# Patient Record
Sex: Female | Born: 1950 | Race: White | Hispanic: No | Marital: Married | Smoking: Never smoker
Health system: Southern US, Community
[De-identification: ages and names within clinical notes are randomized; demographics above are authoritative.]

## PROBLEM LIST (undated history)

## (undated) DIAGNOSIS — R51 Headache: Secondary | ICD-10-CM

## (undated) DIAGNOSIS — R519 Headache, unspecified: Secondary | ICD-10-CM

## (undated) DIAGNOSIS — IMO0002 Reserved for concepts with insufficient information to code with codable children: Secondary | ICD-10-CM

## (undated) DIAGNOSIS — T7840XA Allergy, unspecified, initial encounter: Secondary | ICD-10-CM

## (undated) DIAGNOSIS — C50919 Malignant neoplasm of unspecified site of unspecified female breast: Secondary | ICD-10-CM

## (undated) DIAGNOSIS — R011 Cardiac murmur, unspecified: Secondary | ICD-10-CM

## (undated) DIAGNOSIS — Z923 Personal history of irradiation: Secondary | ICD-10-CM

## (undated) DIAGNOSIS — C801 Malignant (primary) neoplasm, unspecified: Secondary | ICD-10-CM

## (undated) HISTORY — DX: Malignant (primary) neoplasm, unspecified: C80.1

## (undated) HISTORY — PX: BREAST CYST ASPIRATION: SHX578

## (undated) HISTORY — DX: Reserved for concepts with insufficient information to code with codable children: IMO0002

## (undated) HISTORY — DX: Cardiac murmur, unspecified: R01.1

## (undated) HISTORY — DX: Headache: R51

## (undated) HISTORY — DX: Headache, unspecified: R51.9

## (undated) HISTORY — DX: Allergy, unspecified, initial encounter: T78.40XA

## (undated) HISTORY — PX: OOPHORECTOMY: SHX86

---
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
## 1996-03-14 HISTORY — PX: ABDOMINAL HYSTERECTOMY: SHX81

## 1996-03-14 HISTORY — PX: BREAST EXCISIONAL BIOPSY: SUR124

## 2004-11-30 ENCOUNTER — Ambulatory Visit: Payer: Self-pay | Admitting: Endocrinology

## 2006-03-14 DIAGNOSIS — R011 Cardiac murmur, unspecified: Secondary | ICD-10-CM

## 2006-03-14 HISTORY — DX: Cardiac murmur, unspecified: R01.1

## 2006-03-14 HISTORY — PX: BREAST EXCISIONAL BIOPSY: SUR124

## 2006-05-23 ENCOUNTER — Ambulatory Visit: Payer: Self-pay | Admitting: Family Medicine

## 2006-05-26 ENCOUNTER — Ambulatory Visit: Payer: Self-pay | Admitting: Family Medicine

## 2006-06-12 ENCOUNTER — Ambulatory Visit: Payer: Self-pay | Admitting: General Surgery

## 2006-11-20 ENCOUNTER — Ambulatory Visit: Payer: Self-pay | Admitting: General Surgery

## 2007-03-15 DIAGNOSIS — C50919 Malignant neoplasm of unspecified site of unspecified female breast: Secondary | ICD-10-CM

## 2007-03-15 DIAGNOSIS — C801 Malignant (primary) neoplasm, unspecified: Secondary | ICD-10-CM

## 2007-03-15 HISTORY — PX: BREAST LUMPECTOMY: SHX2

## 2007-03-15 HISTORY — PX: BREAST BIOPSY: SHX20

## 2007-03-15 HISTORY — DX: Malignant (primary) neoplasm, unspecified: C80.1

## 2007-03-15 HISTORY — DX: Malignant neoplasm of unspecified site of unspecified female breast: C50.919

## 2007-05-29 ENCOUNTER — Ambulatory Visit: Payer: Self-pay | Admitting: General Surgery

## 2007-05-31 ENCOUNTER — Ambulatory Visit: Payer: Self-pay | Admitting: General Surgery

## 2007-07-02 ENCOUNTER — Ambulatory Visit: Payer: Self-pay | Admitting: General Surgery

## 2007-07-09 ENCOUNTER — Ambulatory Visit: Payer: Self-pay | Admitting: General Surgery

## 2007-07-23 ENCOUNTER — Ambulatory Visit: Payer: Self-pay | Admitting: Radiation Oncology

## 2007-08-13 ENCOUNTER — Ambulatory Visit: Payer: Self-pay | Admitting: Radiation Oncology

## 2007-08-23 ENCOUNTER — Emergency Department: Payer: Self-pay | Admitting: Emergency Medicine

## 2007-09-12 ENCOUNTER — Ambulatory Visit: Payer: Self-pay | Admitting: Radiation Oncology

## 2007-10-13 ENCOUNTER — Ambulatory Visit: Payer: Self-pay | Admitting: Radiation Oncology

## 2007-11-13 ENCOUNTER — Ambulatory Visit: Payer: Self-pay | Admitting: Radiation Oncology

## 2007-11-20 ENCOUNTER — Ambulatory Visit: Payer: Self-pay | Admitting: General Surgery

## 2008-01-13 ENCOUNTER — Ambulatory Visit: Payer: Self-pay | Admitting: Internal Medicine

## 2008-02-04 ENCOUNTER — Ambulatory Visit: Payer: Self-pay | Admitting: Internal Medicine

## 2008-02-12 ENCOUNTER — Ambulatory Visit: Payer: Self-pay | Admitting: Internal Medicine

## 2008-05-12 ENCOUNTER — Ambulatory Visit: Payer: Self-pay | Admitting: Internal Medicine

## 2008-05-19 ENCOUNTER — Ambulatory Visit: Payer: Self-pay | Admitting: General Surgery

## 2008-06-03 ENCOUNTER — Ambulatory Visit: Payer: Self-pay | Admitting: Internal Medicine

## 2008-06-12 ENCOUNTER — Ambulatory Visit: Payer: Self-pay | Admitting: Internal Medicine

## 2008-11-12 ENCOUNTER — Ambulatory Visit: Payer: Self-pay | Admitting: Internal Medicine

## 2008-12-01 ENCOUNTER — Ambulatory Visit: Payer: Self-pay | Admitting: Internal Medicine

## 2008-12-12 ENCOUNTER — Ambulatory Visit: Payer: Self-pay | Admitting: Internal Medicine

## 2009-08-19 ENCOUNTER — Ambulatory Visit: Payer: Self-pay | Admitting: General Surgery

## 2009-11-12 ENCOUNTER — Ambulatory Visit: Payer: Self-pay | Admitting: Internal Medicine

## 2009-12-08 ENCOUNTER — Ambulatory Visit: Payer: Self-pay | Admitting: Internal Medicine

## 2009-12-12 ENCOUNTER — Ambulatory Visit: Payer: Self-pay | Admitting: Internal Medicine

## 2010-08-25 ENCOUNTER — Ambulatory Visit: Payer: Self-pay | Admitting: General Surgery

## 2010-11-25 ENCOUNTER — Ambulatory Visit: Payer: Self-pay | Admitting: Family Medicine

## 2010-12-10 ENCOUNTER — Ambulatory Visit: Payer: Self-pay | Admitting: Internal Medicine

## 2010-12-13 ENCOUNTER — Ambulatory Visit: Payer: Self-pay | Admitting: Internal Medicine

## 2011-03-15 HISTORY — PX: COLONOSCOPY: SHX174

## 2011-09-21 ENCOUNTER — Ambulatory Visit: Payer: Self-pay | Admitting: General Surgery

## 2012-02-22 ENCOUNTER — Ambulatory Visit: Payer: Self-pay | Admitting: General Surgery

## 2012-07-24 ENCOUNTER — Encounter: Payer: Self-pay | Admitting: *Deleted

## 2012-07-24 DIAGNOSIS — C801 Malignant (primary) neoplasm, unspecified: Secondary | ICD-10-CM | POA: Insufficient documentation

## 2012-10-03 ENCOUNTER — Ambulatory Visit: Payer: Self-pay | Admitting: General Surgery

## 2012-11-08 ENCOUNTER — Encounter: Payer: Self-pay | Admitting: *Deleted

## 2012-12-04 ENCOUNTER — Encounter: Payer: Self-pay | Admitting: General Surgery

## 2012-12-04 ENCOUNTER — Ambulatory Visit: Payer: Self-pay | Admitting: General Surgery

## 2012-12-18 ENCOUNTER — Encounter: Payer: Self-pay | Admitting: General Surgery

## 2012-12-18 ENCOUNTER — Ambulatory Visit (INDEPENDENT_AMBULATORY_CARE_PROVIDER_SITE_OTHER): Payer: BC Managed Care – PPO | Admitting: General Surgery

## 2012-12-18 VITALS — BP 142/78 | HR 76 | Resp 12 | Ht 61.0 in | Wt 156.0 lb

## 2012-12-18 DIAGNOSIS — Z853 Personal history of malignant neoplasm of breast: Secondary | ICD-10-CM

## 2012-12-18 NOTE — Progress Notes (Signed)
Patient ID: Kelly Mata, female   DOB: 1950-06-27, 62 y.o.   MRN: [id number]  Chief Complaint  Patient presents with  . Follow-up    mammogram    HPI Kelly Mata is a 62 y.o. female.  who presents for her annual breast evaluation. The most recent mammogram was done on 12-04-12.  Patient does perform regular self breast checks and gets regular mammograms done.    HPI  Past Medical History  Diagnosis Date  . Allergy   . Heart murmur 2008  . 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 Abdominal hysterectomy  1998  . Tonsillectomy  1957  . Breast biopsy  2008  . Breast biopsy Left 2009    stereo breast biopsy  . Breast lumpectomy Left 2009  . Breast biopsy  1998  . Colonoscopy  2013    Family History  Problem Relation Age of Onset  . Cancer Other     cousin    Social History History  Substance Use Topics  . Smoking status: Never Smoker   . Smokeless tobacco: Never Used  . Alcohol Use: Yes     Comment: 1/day    Allergies  Allergen Reactions  . Ceclor [Cefaclor] Hives  . Ampicillin Rash    No current outpatient prescriptions on file.   No current facility-administered medications for this visit.    Review of Systems Review of Systems  Constitutional: Negative.   Respiratory: Negative.   Cardiovascular: Negative.     Blood pressure 142/78, pulse 76, resp. rate 12, height 5\' 1"  (1.549 m), weight 156 lb (70.761 kg).  Physical Exam Physical Exam  Constitutional: She is oriented to person, place, and time. She appears well-developed and well-nourished.  Eyes: Conjunctivae are normal. No scleral icterus.  Neck: Neck supple. No mass and no thyromegaly present.  Cardiovascular: Normal rate, regular rhythm, normal heart sounds, intact distal pulses and normal pulses.   Pulses:      Dorsalis pedis pulses are 2+ on the right side, and 2+ on the left side.       Posterior tibial pulses are 2+ on the right  side, and 2+ on the left side.  No edema and no VV  Pulmonary/Chest: Right breast exhibits no inverted nipple, no mass, no nipple discharge, no skin change and no tenderness. Left breast exhibits no inverted nipple, no mass, no nipple discharge, no skin change and no tenderness.  Left lumpectomy site well healed.  Abdominal: Soft. Normal appearance and bowel sounds are normal.  Lymphadenopathy:    She has no cervical adenopathy.    She has no axillary adenopathy.  Neurological: She is alert and oriented to person, place, and time.  Skin: Skin is warm and dry.    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     Plan The patient has been asked to return to the office in one year with a bilateral diagnostic mammogram.       Hill Mackie G 12/18/2012, 1:10 PM

## 2012-12-18 NOTE — Patient Instructions (Addendum)
Patient will be asked to return to the office in one year for a bilateral diagnotic mammogram. Advised on monthly self exam, call for any problems.

## 2012-12-18 NOTE — Addendum Note (Signed)
Addended by: Kieth Brightly on: 12/18/2012 01:12 PM   Modules accepted: Level of Service

## 2013-11-22 ENCOUNTER — Encounter: Payer: Self-pay | Admitting: General Surgery

## 2014-01-06 ENCOUNTER — Encounter: Payer: Self-pay | Admitting: General Surgery

## 2014-01-13 ENCOUNTER — Encounter: Payer: Self-pay | Admitting: General Surgery

## 2014-01-14 ENCOUNTER — Encounter: Payer: Self-pay | Admitting: General Surgery

## 2014-01-14 ENCOUNTER — Ambulatory Visit (INDEPENDENT_AMBULATORY_CARE_PROVIDER_SITE_OTHER): Payer: BC Managed Care – PPO | Admitting: General Surgery

## 2014-01-14 VITALS — BP 130/78 | HR 74 | Resp 12 | Ht 61.5 in | Wt 162.0 lb

## 2014-01-14 DIAGNOSIS — Z853 Personal history of malignant neoplasm of breast: Secondary | ICD-10-CM

## 2014-01-14 NOTE — Patient Instructions (Signed)
Patient to return in 1 year with a Bilateral diagnostic mammogram. Continue self breast exams. Call office for any new breast issues or concerns.

## 2014-01-14 NOTE — Progress Notes (Signed)
Patient ID: Kelly Mata, female   DOB: [birth year], 63 y.o.   MRN: [id number]  Chief Complaint  Patient presents with  . Follow-up    mammogram    HPI Kelly Mata is a 63 y.o. female who presents for a breast cancer follow up. The most recent mammogram was done on 01/03/14. Patient does perform regular self breast checks and gets regular mammograms done. The patient denies any new problems with the breasts at this time.     HPI  Past Medical History  Diagnosis Date  . Allergy   . Heart murmur 2008  . 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 Tonsillectomy  1957  . Breast biopsy  2008  . Breast biopsy Left 2009    stereo breast biopsy  . Breast lumpectomy Left 2009  . Breast biopsy  1998  . Colonoscopy  2013  . Abdominal hysterectomy  1998    complete     Family History  Problem Relation Age of Onset  . Cancer Other     cousin    Social History History  Substance Use Topics  . Smoking status: Never Smoker   . Smokeless tobacco: Never Used  . Alcohol Use: Yes     Comment: 1/day    Allergies  Allergen Reactions  . Ceclor [Cefaclor] Hives  . Ampicillin Rash    No current outpatient prescriptions on file.   No current facility-administered medications for this visit.    Review of Systems Review of Systems  Constitutional: Negative.   Respiratory: Negative.   Cardiovascular: Negative.     Blood pressure 130/78, pulse 74, resp. rate 12, height 5' 1.5" (1.562 m), weight 162 lb (73.483 kg).  Physical Exam Physical Exam  Constitutional: She is oriented to person, place, and time. She appears well-developed and well-nourished.  Eyes: Conjunctivae are normal. No scleral icterus.  Neck: Neck supple. No thyromegaly present.  Cardiovascular: Normal rate, regular rhythm and normal heart sounds.   No murmur heard. Pulmonary/Chest: Effort normal and breath sounds normal. Right breast exhibits no inverted  nipple, no mass, no nipple discharge, no skin change and no tenderness. Left breast exhibits no inverted nipple, no mass, no nipple discharge, no skin change and no tenderness.  Abdominal: Soft. Bowel sounds are normal. There is no tenderness.  Lymphadenopathy:    She has no cervical adenopathy.    She has no axillary adenopathy.  Neurological: She is alert and oriented to person, place, and time.  Skin: Skin is warm and dry.    Data Reviewed  Mammogram reviewed and stable.   Assessment    Stable exam. 1yrs post left breast lumpectomy, radiation, 5 yrs Tamoxifen    Plan    Bilateral diagnostic mammogram in 1 year.        SANKAR,SEEPLAPUTHUR G 01/14/2014, 9:54 AM

## 2015-01-09 ENCOUNTER — Encounter: Payer: Self-pay | Admitting: General Surgery

## 2015-01-15 ENCOUNTER — Ambulatory Visit (INDEPENDENT_AMBULATORY_CARE_PROVIDER_SITE_OTHER): Payer: BLUE CROSS/BLUE SHIELD | Admitting: General Surgery

## 2015-01-15 ENCOUNTER — Encounter: Payer: Self-pay | Admitting: General Surgery

## 2015-01-15 VITALS — BP 140/78 | HR 68 | Resp 12 | Ht 61.0 in | Wt 160.0 lb

## 2015-01-15 DIAGNOSIS — Z86000 Personal history of in-situ neoplasm of breast: Secondary | ICD-10-CM

## 2015-01-15 DIAGNOSIS — Z853 Personal history of malignant neoplasm of breast: Secondary | ICD-10-CM | POA: Diagnosis not present

## 2015-01-15 NOTE — Patient Instructions (Addendum)
The patient has been asked to return to the office in one year with a bilateral screening mammogram. Continue self breast exams. Call office for any new breast issues or concerns.  

## 2015-01-15 NOTE — Progress Notes (Signed)
Patient ID: Kelly Mata, female   DOB: May 20, 1950, 64 y.o.   MRN: [id number]  Chief Complaint  Patient presents with  . Follow-up    mammogram    HPI Kelly Mata is a 64 y.o. female.  who presents for her breast cancer follow up and breast evaluation. The most recent mammogram was done on 01-09-15.  Patient does perform regular self breast checks and gets regular mammograms done.  No new breast issues. I have reviewed the history of present illness with the patient. HPI  Past Medical History  Diagnosis Date  . Allergy   . Heart murmur 2008  . Cancer [facility]) 2009    left breast DCIS, 5 yrs tamoxifen    Past Surgical History  Procedure Laterality Date  . Tonsillectomy  1957  . Breast biopsy  2008  . Breast biopsy Left 2009    stereo breast biopsy  . Breast lumpectomy Left 2009  . Breast biopsy  1998  . Colonoscopy  2013  . Abdominal hysterectomy  1998    complete     Family History  Problem Relation Age of Onset  . Cancer Other     cousin, breast  . Cancer Father     pancreatic    Social History Social History  Substance Use Topics  . Smoking status: Never Smoker   . Smokeless tobacco: Never Used  . Alcohol Use: Yes     Comment: 1/day    Allergies  Allergen Reactions  . Ceclor [Cefaclor] Hives  . Ampicillin Rash    No current outpatient prescriptions on file.   No current facility-administered medications for this visit.    Review of Systems Review of Systems  Constitutional: Negative.   Respiratory: Negative.   Cardiovascular: Negative.     Blood pressure 140/78, pulse 68, resp. rate 12, height 5\' 1"  (1.549 m), weight 160 lb (72.576 kg).  Physical Exam Physical Exam  Constitutional: She is oriented to person, place, and time. She appears well-developed and well-nourished.  HENT:  Mouth/Throat: Oropharynx is clear and moist.  Eyes: Conjunctivae are normal. No scleral icterus.  Neck: Neck supple.  Cardiovascular: Normal rate, regular  rhythm and normal heart sounds.   Pulmonary/Chest: Effort normal and breath sounds normal. Right breast exhibits no inverted nipple, no mass, no nipple discharge, no skin change and no tenderness. Left breast exhibits no inverted nipple, no mass, no nipple discharge, no skin change and no tenderness.  Left lumpectomy site well healed.  Abdominal: Soft. Bowel sounds are normal. There is no tenderness.  Lymphadenopathy:    She has no cervical adenopathy.    She has no axillary adenopathy.  Neurological: She is alert and oriented to person, place, and time.  Skin: Skin is warm and dry.  Psychiatric: Her behavior is normal.    Data Reviewed Mammogram reviewed and stable.  Assessment    Stable physical exam. 7 yrs post left breast lumpectomy, radiation for DCIS, completed 5 yrs Tamoxifen.    Plan    The patient has been asked to return to the office in one year with a bilateral screening mammogram.      PCP:  Rozelle Logan 01/15/2015, 10:56 AM

## 2015-03-06 ENCOUNTER — Telehealth: Payer: Self-pay | Admitting: General Surgery

## 2015-03-06 NOTE — Telephone Encounter (Signed)
03-06-15 @ 9:36AM I L/M FOR PT TO CALL BACK. MAUREEN ASK ME TO CALL PT REGARDING HER BILL STM.SHE SENT THE STUB TO PAY WITH HER CARD INFO ON IT & SHE HAS TRIED ON SEVERAL TIMES TO RUN & CARD # WILL NOT GO THROUGH. @ 9:47AM PT CALLED WHILE I WAS TYPING NOTE & NEW INFO GIVEN & TRANSACTION WITH THROUGH/MTH

## 2015-08-18 ENCOUNTER — Encounter: Payer: Self-pay | Admitting: *Deleted

## 2015-08-20 ENCOUNTER — Ambulatory Visit: Payer: BLUE CROSS/BLUE SHIELD | Admitting: General Surgery

## 2015-08-25 ENCOUNTER — Ambulatory Visit: Payer: BLUE CROSS/BLUE SHIELD | Admitting: General Surgery

## 2015-12-22 ENCOUNTER — Other Ambulatory Visit: Payer: Self-pay | Admitting: General Surgery

## 2015-12-22 DIAGNOSIS — Z1239 Encounter for other screening for malignant neoplasm of breast: Secondary | ICD-10-CM

## 2015-12-30 ENCOUNTER — Ambulatory Visit
Admission: RE | Admit: 2015-12-30 | Discharge: 2015-12-30 | Disposition: A | Discharge status: Home / Self Care | Payer: Self-pay | Source: Ambulatory Visit | Attending: *Deleted | Admitting: *Deleted

## 2015-12-30 ENCOUNTER — Other Ambulatory Visit: Payer: Self-pay | Admitting: *Deleted

## 2015-12-30 DIAGNOSIS — Z9289 Personal history of other medical treatment: Secondary | ICD-10-CM

## 2016-01-01 ENCOUNTER — Other Ambulatory Visit: Payer: Self-pay | Admitting: *Deleted

## 2016-01-01 ENCOUNTER — Ambulatory Visit
Admission: RE | Admit: 2016-01-01 | Discharge: 2016-01-01 | Disposition: A | Discharge status: Home / Self Care | Payer: Self-pay | Source: Ambulatory Visit | Attending: *Deleted | Admitting: *Deleted

## 2016-01-01 DIAGNOSIS — Z9289 Personal history of other medical treatment: Secondary | ICD-10-CM

## 2016-01-04 ENCOUNTER — Encounter: Payer: Self-pay | Admitting: *Deleted

## 2016-02-03 ENCOUNTER — Ambulatory Visit
Admission: RE | Admit: 2016-02-03 | Discharge: 2016-02-03 | Disposition: A | Discharge status: Home / Self Care | Payer: BLUE CROSS/BLUE SHIELD | Source: Ambulatory Visit | Attending: General Surgery | Admitting: General Surgery

## 2016-02-03 DIAGNOSIS — Z1239 Encounter for other screening for malignant neoplasm of breast: Secondary | ICD-10-CM

## 2016-02-03 DIAGNOSIS — Z1231 Encounter for screening mammogram for malignant neoplasm of breast: Secondary | ICD-10-CM | POA: Diagnosis present

## 2016-02-09 ENCOUNTER — Encounter: Payer: Self-pay | Admitting: *Deleted

## 2016-02-11 ENCOUNTER — Encounter: Payer: Self-pay | Admitting: General Surgery

## 2016-02-11 ENCOUNTER — Ambulatory Visit (INDEPENDENT_AMBULATORY_CARE_PROVIDER_SITE_OTHER): Payer: BLUE CROSS/BLUE SHIELD | Admitting: General Surgery

## 2016-02-11 VITALS — BP 130/74 | HR 78 | Resp 12 | Ht 61.0 in | Wt 156.0 lb

## 2016-02-11 DIAGNOSIS — Z86000 Personal history of in-situ neoplasm of breast: Secondary | ICD-10-CM | POA: Diagnosis not present

## 2016-02-11 NOTE — Progress Notes (Signed)
Patient ID: Kelly Mata, female   DOB: Apr 06, 1950, 65 y.o.   MRN: [id number]  Chief Complaint  Patient presents with  . Follow-up    HPI Kelly Mata is a 65 y.o. female who presents for a breast evaluation, s/p left breast lumpectomy and radiation in 2009. The most recent mammogram was done on 02/03/2016.  Patient does perform regular self breast checks and gets regular mammograms done.   I have reviewed the history of present illness with the patient.   HPI  Past Medical History:  Diagnosis Date  . Allergy   . Cancer [facility]) 2009   left breast DCIS, 5 yrs tamoxifen  . Heart murmur 2008    Past Surgical History:  Procedure Laterality Date  . ABDOMINAL HYSTERECTOMY  1998   complete   . BREAST BIOPSY  2008  . BREAST BIOPSY Left 2009   stereo breast biopsy  . BREAST BIOPSY  1998  . BREAST LUMPECTOMY Left 2009   radiation  . COLONOSCOPY  2013  . TONSILLECTOMY  1957    Family History  Problem Relation Age of Onset  . Cancer Other     cousin, breast  . Cancer Father     pancreatic  . Breast cancer Cousin 67    Social History Social History  Substance Use Topics  . Smoking status: Never Smoker  . Smokeless tobacco: Never Used  . Alcohol use Yes     Comment: 1/day    Allergies  Allergen Reactions  . Ceclor [Cefaclor] Hives  . Ampicillin Rash    No current outpatient prescriptions on file.   No current facility-administered medications for this visit.     Review of Systems Review of Systems  Constitutional: Negative.   Respiratory: Negative.   Cardiovascular: Negative.     Blood pressure 130/74, pulse 78, resp. rate 12, height 5\' 1"  (1.549 m), weight 156 lb (70.8 kg).  Physical Exam Physical Exam  Constitutional: She is oriented to person, place, and time. She appears well-developed and well-nourished.  Eyes: Conjunctivae are normal. No scleral icterus.  Neck: Neck supple.  Cardiovascular: Normal rate, regular rhythm and normal heart sounds.    Pulmonary/Chest: Effort normal and breath sounds normal. Right breast exhibits no inverted nipple, no mass, no nipple discharge, no skin change and no tenderness. Left breast exhibits no inverted nipple, no mass, no nipple discharge, no skin change and no tenderness.  Left lumpectomy site well-healed.   Abdominal: Soft. Bowel sounds are normal. There is no tenderness.  Lymphadenopathy:    She has no cervical adenopathy.    She has no axillary adenopathy.  Neurological: She is alert and oriented to person, place, and time.  Skin: Skin is warm and dry.    Data Reviewed Mammogram reviewed and stable  Assessment    Stable exam. 8 years post left breast lumpectomy, completed radiation for DCIS and 5 years of Tamoxifen    Plan    Patient will be asked to return to the office in one year with a bilateral screening mammogram.    This information has been scribed by Gaspar Cola CMA.    SANKAR,SEEPLAPUTHUR G 02/11/2016, 10:24 AM

## 2016-02-11 NOTE — Patient Instructions (Signed)
Patient will be asked to return to the office in one year with a bilateral screening mammogram. 

## 2016-06-13 DIAGNOSIS — G8929 Other chronic pain: Secondary | ICD-10-CM | POA: Diagnosis not present

## 2016-06-13 DIAGNOSIS — M25562 Pain in left knee: Secondary | ICD-10-CM | POA: Diagnosis not present

## 2016-06-13 DIAGNOSIS — M25512 Pain in left shoulder: Secondary | ICD-10-CM | POA: Diagnosis not present

## 2016-06-23 ENCOUNTER — Ambulatory Visit (INDEPENDENT_AMBULATORY_CARE_PROVIDER_SITE_OTHER): Payer: PPO | Admitting: Family

## 2016-06-23 ENCOUNTER — Encounter: Payer: Self-pay | Admitting: Family

## 2016-06-23 VITALS — BP 122/82 | HR 72 | Temp 98.2°F | Resp 16 | Ht 61.0 in | Wt 157.0 lb

## 2016-06-23 DIAGNOSIS — R42 Dizziness and giddiness: Secondary | ICD-10-CM | POA: Insufficient documentation

## 2016-06-23 DIAGNOSIS — R251 Tremor, unspecified: Secondary | ICD-10-CM | POA: Insufficient documentation

## 2016-06-23 DIAGNOSIS — R079 Chest pain, unspecified: Secondary | ICD-10-CM | POA: Diagnosis not present

## 2016-06-23 LAB — COMPREHENSIVE METABOLIC PANEL
ALBUMIN: 4.4 g/dL (ref 3.5–5.2)
ALT: 16 U/L (ref 0–35)
AST: 15 U/L (ref 0–37)
Alkaline Phosphatase: 77 U/L (ref 39–117)
BILIRUBIN TOTAL: 0.4 mg/dL (ref 0.2–1.2)
BUN: 11 mg/dL (ref 6–23)
CALCIUM: 9.6 mg/dL (ref 8.4–10.5)
CO2: 28 mEq/L (ref 19–32)
CREATININE: 0.83 mg/dL (ref 0.40–1.20)
Chloride: 105 mEq/L (ref 96–112)
GFR: 73.13 mL/min (ref >60.00)
Glucose, Bld: 108 mg/dL — ABNORMAL HIGH (ref 70–99)
Potassium: 3.8 mEq/L (ref 3.5–5.1)
Sodium: 140 mEq/L (ref 135–145)
Total Protein: 6.9 g/dL (ref 6.0–8.3)

## 2016-06-23 LAB — MAGNESIUM: Magnesium: 2.1 mg/dL (ref 1.5–2.5)

## 2016-06-23 LAB — CBC WITH DIFFERENTIAL/PLATELET
BASOS ABS: 0.1 10*3/uL (ref 0.0–0.1)
BASOS PCT: 0.8 % (ref 0.0–3.0)
EOS ABS: 0 10*3/uL (ref 0.0–0.7)
Eosinophils Relative: 0.4 % (ref 0.0–5.0)
HEMATOCRIT: 38.7 % (ref 36.0–46.0)
HEMOGLOBIN: 12.7 g/dL (ref 12.0–15.0)
LYMPHS PCT: 15.2 % (ref 12.0–46.0)
Lymphs Abs: 1.4 10*3/uL (ref 0.7–4.0)
MCHC: 33 g/dL (ref 30.0–36.0)
MCV: 91.8 fl (ref 78.0–100.0)
Monocytes Absolute: 0.5 10*3/uL (ref 0.1–1.0)
Monocytes Relative: 5.3 % (ref 3.0–12.0)
Neutro Abs: 7.4 10*3/uL (ref 1.4–7.7)
Neutrophils Relative %: 78.3 % — ABNORMAL HIGH (ref 43.0–77.0)
Platelets: 270 10*3/uL (ref 150.0–400.0)
RBC: 4.21 Mil/uL (ref 3.87–5.11)
RDW: 13.4 % (ref 11.5–15.5)
WBC: 9.4 10*3/uL (ref 4.0–10.5)

## 2016-06-23 LAB — HEMOGLOBIN A1C: HEMOGLOBIN A1C: 5.7 % (ref 4.6–6.5)

## 2016-06-23 LAB — TSH: TSH: 2.76 u[IU]/mL (ref 0.35–4.50)

## 2016-06-23 NOTE — Patient Instructions (Signed)
Pleasure meeting you  Limit caffeine and let me know about dizziness, tremor  Follow up one month

## 2016-06-23 NOTE — Assessment & Plan Note (Addendum)
Working diagnosis of intentional tremor. Absence of other neurologic signs. Discussed limiting caffeine. Advised close follow up and explained that tremor can be an early symptoms of PD. Patient understands this.

## 2016-06-23 NOTE — Progress Notes (Signed)
Subjective:    Patient ID: Kelly Mata, female    DOB: [birth year], 66 y.o.   MRN: [id number]  CC: Kelly Mata is a 66 y.o. female who presents today to establish care.    HPI: Had been with [facility] clinic. On medicare and would like Medicare wellness check.   CC: anterior throbbing neck pain on sides, for couple of months, unchanged.   Has been having dizziness, even when sitting. Feels better when 'active.' Someone feels like having 'panic attack' where heart races and 'had too much caffeine.' NO  Syncope, CP, HA, vision changed, hearing changes, vertigo,tinnitus, sinus congestion.   Heart murmur as child. Hasn't seen cardiology. Exercise without CP.   Laid off 3 months ago.   At dentist, plaque was seen in arteries.  2 cups coffee per day      HISTORY:  Past Medical History:  Diagnosis Date  . Allergy   . Cancer [facility]) 2009   left breast DCIS, 5 yrs tamoxifen  . Heart murmur 2008  . Squamous cell carcinoma    Past Surgical History:  Procedure Laterality Date  . ABDOMINAL HYSTERECTOMY  1998   complete   . BREAST BIOPSY  2008  . BREAST BIOPSY Left 2009   stereo breast biopsy  . BREAST BIOPSY  1998  . BREAST LUMPECTOMY Left 2009   radiation  . COLONOSCOPY  2013  . TONSILLECTOMY  1957   Family History  Problem Relation Age of Onset  . Cancer Other     cousin, breast  . Cancer Father     pancreatic  . Breast cancer Cousin 40    Allergies: Ceclor [cefaclor] and Ampicillin No current outpatient prescriptions on file prior to visit.   No current facility-administered medications on file prior to visit.     Social History  Substance Use Topics  . Smoking status: Never Smoker  . Smokeless tobacco: Never Used  . Alcohol use Yes     Comment: 1/day    Review of Systems  Constitutional: Negative for chills and fever.  HENT: Negative for congestion, hearing loss and trouble swallowing.   Eyes: Negative for visual disturbance.  Respiratory:  Negative for cough, shortness of breath and wheezing.   Cardiovascular: Negative for chest pain and palpitations.  Gastrointestinal: Negative for nausea and vomiting.  Neurological: Positive for dizziness, tremors and light-headedness. Negative for seizures, syncope, speech difficulty, weakness and headaches.  Psychiatric/Behavioral: Negative for confusion.      Objective:    BP 122/82 (BP Location: Right Arm, Patient Position: Sitting, Cuff Size: Normal)   Pulse 72   Temp 98.2 F (36.8 C) (Oral)   Resp 16   Ht 5\' 1"  (1.549 m)   Wt 157 lb (71.2 kg)   SpO2 99%   BMI 29.66 kg/m  BP Readings from Last 3 Encounters:  06/23/16 122/82  02/11/16 130/74  01/15/15 140/78   Wt Readings from Last 3 Encounters:  06/23/16 157 lb (71.2 kg)  02/11/16 156 lb (70.8 kg)  01/15/15 160 lb (72.6 kg)    Physical Exam  Constitutional: She appears well-developed and well-nourished.  HENT:  Head: Normocephalic and atraumatic.  Right Ear: Hearing, tympanic membrane, external ear and ear canal normal. No drainage, swelling or tenderness. No foreign bodies. Tympanic membrane is not erythematous and not bulging. No middle ear effusion. No decreased hearing is noted.  Left Ear: Hearing, tympanic membrane, external ear and ear canal normal. No drainage, swelling or tenderness. No foreign bodies. Tympanic membrane is  not erythematous and not bulging.  No middle ear effusion. No decreased hearing is noted.  Nose: Nose normal. No rhinorrhea. Right sinus exhibits no maxillary sinus tenderness and no frontal sinus tenderness. Left sinus exhibits no maxillary sinus tenderness and no frontal sinus tenderness.  Mouth/Throat: Uvula is midline, oropharynx is clear and moist and mucous membranes are normal. No oropharyngeal exudate, posterior oropharyngeal edema, posterior oropharyngeal erythema or tonsillar abscesses.  Eyes: Conjunctivae, EOM and lids are normal. Pupils are equal, round, and reactive to light. Lids are  everted and swept, no foreign bodies found.  Normal fundus bilaterally   Cardiovascular: Normal rate, regular rhythm, normal heart sounds and normal pulses.   Pulmonary/Chest: Effort normal and breath sounds normal. She has no wheezes. She has no rhonchi. She has no rales.  Lymphadenopathy:       Head (right side): No submental, no submandibular, no tonsillar, no preauricular, no posterior auricular and no occipital adenopathy present.       Head (left side): No submental, no submandibular, no tonsillar, no preauricular, no posterior auricular and no occipital adenopathy present.    She has no cervical adenopathy.       Right cervical: No superficial cervical, no deep cervical and no posterior cervical adenopathy present.      Left cervical: No superficial cervical, no deep cervical and no posterior cervical adenopathy present.  Neurological: She is alert. She has normal strength. No cranial nerve deficit or sensory deficit. She displays a negative Romberg sign.  Reflex Scores:      Bicep reflexes are 2+ on the right side and 2+ on the left side.      Patellar reflexes are 2+ on the right side and 2+ on the left side. Grip equal and strong bilateral upper extremities. Gait strong and steady. Able to perform  finger-to-nose without difficulty.  Fine resting tremor in right hand, resolves with intention  Skin: Skin is warm and dry.  Psychiatric: She has a normal mood and affect. Her speech is normal and behavior is normal. Thought content normal.  Vitals reviewed.      Assessment & Plan:   Problem List Items Addressed This Visit      Other   Tremor    Working diagnosis of intentional tremor. Absence of other neurologic signs. Discussed limiting caffeine. Advised close follow up and explained that tremor can be an early symptoms of PD. Patient understands this.       Dizziness - Primary    Etiology unclear at this time. Reassured by normal EKG and neurologic exam. No cardiac history.  Pending Carotid US. Not orthostatic ( see flow sheet). Pending lab studies.  Advised plenty of water, limiting caffeine. Follow up in one month.       Relevant Orders   US Carotid Duplex Bilateral   CBC with Differential/Platelet   Comprehensive metabolic panel   Hemoglobin A1c   TSH   Magnesium    Other Visit Diagnoses    Chest pain, unspecified type       Relevant Orders   EKG 12-Lead (Completed)       Ms. Bobby does not currently have medications on file.   No orders of the defined types were placed in this encounter.   Return precautions given.   Risks, benefits, and alternatives of the medications and treatment plan prescribed today were discussed, and patient expressed understanding.   Education regarding symptom management and diagnosis given to patient on AVS.  Continue to follow with Mable Paris,  FNP for routine health maintenance.   Kelly Mata and I agreed with plan.   Mable Paris, FNP  Total of 25 minutes spent with patient, greater than 50% of which was spent in discussion of  Dizziness.

## 2016-06-23 NOTE — Assessment & Plan Note (Addendum)
Etiology unclear at this time. Reassured by normal EKG and neurologic exam. No cardiac history. Pending Carotid US. Not orthostatic ( see flow sheet). Pending lab studies.  Advised plenty of water, limiting caffeine. Follow up in one month.

## 2016-06-30 ENCOUNTER — Ambulatory Visit (INDEPENDENT_AMBULATORY_CARE_PROVIDER_SITE_OTHER): Payer: PPO | Admitting: Family

## 2016-06-30 ENCOUNTER — Encounter: Payer: Self-pay | Admitting: Family

## 2016-06-30 VITALS — BP 132/78 | HR 73 | Temp 97.4°F | Ht 61.0 in | Wt 155.6 lb

## 2016-06-30 DIAGNOSIS — N959 Unspecified menopausal and perimenopausal disorder: Secondary | ICD-10-CM | POA: Diagnosis not present

## 2016-06-30 DIAGNOSIS — E559 Vitamin D deficiency, unspecified: Secondary | ICD-10-CM | POA: Diagnosis not present

## 2016-06-30 DIAGNOSIS — Z0000 Encounter for general adult medical examination without abnormal findings: Secondary | ICD-10-CM

## 2016-06-30 DIAGNOSIS — Z1159 Encounter for screening for other viral diseases: Secondary | ICD-10-CM | POA: Diagnosis not present

## 2016-06-30 DIAGNOSIS — Z23 Encounter for immunization: Secondary | ICD-10-CM

## 2016-06-30 NOTE — Patient Instructions (Addendum)
shingrex series at local pharmacy  Labs when fasting  DEXA  Health Maintenance, Female Adopting a healthy lifestyle and getting preventive care can go a long way to promote health and wellness. Talk with your health care provider about what schedule of regular examinations is right for you. This is a good chance for you to check in with your provider about disease prevention and staying healthy. In between checkups, there are plenty of things you can do on your own. Experts have done a lot of research about which lifestyle changes and preventive measures are most likely to keep you healthy. Ask your health care provider for more information. Weight and diet Eat a healthy diet  Be sure to include plenty of vegetables, fruits, low-fat dairy products, and lean protein.  Do not eat a lot of foods high in solid fats, added sugars, or salt.  Get regular exercise. This is one of the most important things you can do for your health.  Most adults should exercise for at least 150 minutes each week. The exercise should increase your heart rate and make you sweat (moderate-intensity exercise).  Most adults should also do strengthening exercises at least twice a week. This is in addition to the moderate-intensity exercise. Maintain a healthy weight  Body mass index (BMI) is a measurement that can be used to identify possible weight problems. It estimates body fat based on height and weight. Your health care provider can help determine your BMI and help you achieve or maintain a healthy weight.  For females 79 years of age and older:  A BMI below 18.5 is considered underweight.  A BMI of 18.5 to 24.9 is normal.  A BMI of 25 to 29.9 is considered overweight.  A BMI of 30 and above is considered obese. Watch levels of cholesterol and blood lipids  You should start having your blood tested for lipids and cholesterol at 66 years of age, then have this test every 5 years.  You may need to have  your cholesterol levels checked more often if:  Your lipid or cholesterol levels are high.  You are older than 66 years of age.  You are at high risk for heart disease. Cancer screening Lung Cancer  Lung cancer screening is recommended for adults 62-39 years old who are at high risk for lung cancer because of a history of smoking.  A yearly low-dose CT scan of the lungs is recommended for people who:  Currently smoke.  Have quit within the past 15 years.  Have at least a 30-pack-year history of smoking. A pack year is smoking an average of one pack of cigarettes a day for 1 year.  Yearly screening should continue until it has been 15 years since you quit.  Yearly screening should stop if you develop a health problem that would prevent you from having lung cancer treatment. Breast Cancer  Practice breast self-awareness. This means understanding how your breasts normally appear and feel.  It also means doing regular breast self-exams. Let your health care provider know about any changes, no matter how small.  If you are in your 20s or 30s, you should have a clinical breast exam (CBE) by a health care provider every 1-3 years as part of a regular health exam.  If you are 59 or older, have a CBE every year. Also consider having a breast X-ray (mammogram) every year.  If you have a family history of breast cancer, talk to your health care provider about genetic  screening.  If you are at high risk for breast cancer, talk to your health care provider about having an MRI and a mammogram every year.  Breast cancer gene (BRCA) assessment is recommended for women who have family members with BRCA-related cancers. BRCA-related cancers include:  Breast.  Ovarian.  Tubal.  Peritoneal cancers.  Results of the assessment will determine the need for genetic counseling and BRCA1 and BRCA2 testing. Cervical Cancer  Your health care provider may recommend that you be screened regularly  for cancer of the pelvic organs (ovaries, uterus, and vagina). This screening involves a pelvic examination, including checking for microscopic changes to the surface of your cervix (Pap test). You may be encouraged to have this screening done every 3 years, beginning at age 34.  For women ages 32-65, health care providers may recommend pelvic exams and Pap testing every 3 years, or they may recommend the Pap and pelvic exam, combined with testing for human papilloma virus (HPV), every 5 years. Some types of HPV increase your risk of cervical cancer. Testing for HPV may also be done on women of any age with unclear Pap test results.  Other health care providers may not recommend any screening for nonpregnant women who are considered low risk for pelvic cancer and who do not have symptoms. Ask your health care provider if a screening pelvic exam is right for you.  If you have had past treatment for cervical cancer or a condition that could lead to cancer, you need Pap tests and screening for cancer for at least 20 years after your treatment. If Pap tests have been discontinued, your risk factors (such as having a new sexual partner) need to be reassessed to determine if screening should resume. Some women have medical problems that increase the chance of getting cervical cancer. In these cases, your health care provider may recommend more frequent screening and Pap tests. Colorectal Cancer  This type of cancer can be detected and often prevented.  Routine colorectal cancer screening usually begins at 66 years of age and continues through 66 years of age.  Your health care provider may recommend screening at an earlier age if you have risk factors for colon cancer.  Your health care provider may also recommend using home test kits to check for hidden blood in the stool.  A small camera at the end of a tube can be used to examine your colon directly (sigmoidoscopy or colonoscopy). This is done to check  for the earliest forms of colorectal cancer.  Routine screening usually begins at age 66.  Direct examination of the colon should be repeated every 5-10 years through 66 years of age. However, you may need to be screened more often if early forms of precancerous polyps or small growths are found. Skin Cancer  Check your skin from head to toe regularly.  Tell your health care provider about any new moles or changes in moles, especially if there is a change in a mole's shape or color.  Also tell your health care provider if you have a mole that is larger than the size of a pencil eraser.  Always use sunscreen. Apply sunscreen liberally and repeatedly throughout the day.  Protect yourself by wearing long sleeves, pants, a wide-brimmed hat, and sunglasses whenever you are outside. Heart disease, diabetes, and high blood pressure  High blood pressure causes heart disease and increases the risk of stroke. High blood pressure is more likely to develop in:  People who have blood pressure  in the high end of the normal range (130-139/85-89 mm Hg).  People who are overweight or obese.  People who are African American.  If you are 33-46 years of age, have your blood pressure checked every 3-5 years. If you are 45 years of age or older, have your blood pressure checked every year. You should have your blood pressure measured twice-once when you are at a hospital or clinic, and once when you are not at a hospital or clinic. Record the average of the two measurements. To check your blood pressure when you are not at a hospital or clinic, you can use:  An automated blood pressure machine at a pharmacy.  A home blood pressure monitor.  If you are between 62 years and 77 years old, ask your health care provider if you should take aspirin to prevent strokes.  Have regular diabetes screenings. This involves taking a blood sample to check your fasting blood sugar level.  If you are at a normal weight  and have a low risk for diabetes, have this test once every three years after 66 years of age.  If you are overweight and have a high risk for diabetes, consider being tested at a younger age or more often. Preventing infection Hepatitis B  If you have a higher risk for hepatitis B, you should be screened for this virus. You are considered at high risk for hepatitis B if:  You were born in a country where hepatitis B is common. Ask your health care provider which countries are considered high risk.  Your parents were born in a high-risk country, and you have not been immunized against hepatitis B (hepatitis B vaccine).  You have HIV or AIDS.  You use needles to inject street drugs.  You live with someone who has hepatitis B.  You have had sex with someone who has hepatitis B.  You get hemodialysis treatment.  You take certain medicines for conditions, including cancer, organ transplantation, and autoimmune conditions. Hepatitis C  Blood testing is recommended for:  Everyone born from [birth year] through 1965.  Anyone with known risk factors for hepatitis C. Sexually transmitted infections (STIs)  You should be screened for sexually transmitted infections (STIs) including gonorrhea and chlamydia if:  You are sexually active and are younger than 66 years of age.  You are older than 66 years of age and your health care provider tells you that you are at risk for this type of infection.  Your sexual activity has changed since you were last screened and you are at an increased risk for chlamydia or gonorrhea. Ask your health care provider if you are at risk.  If you do not have HIV, but are at risk, it may be recommended that you take a prescription medicine daily to prevent HIV infection. This is called pre-exposure prophylaxis (PrEP). You are considered at risk if:  You are sexually active and do not regularly use condoms or know the HIV status of your partner(s).  You take drugs by  injection.  You are sexually active with a partner who has HIV. Talk with your health care provider about whether you are at high risk of being infected with HIV. If you choose to begin PrEP, you should first be tested for HIV. You should then be tested every 3 months for as long as you are taking PrEP. Pregnancy  If you are premenopausal and you may become pregnant, ask your health care provider about preconception counseling.  If you  may become pregnant, take 400 to 800 micrograms (mcg) of folic acid every day.  If you want to prevent pregnancy, talk to your health care provider about birth control (contraception). Osteoporosis and menopause  Osteoporosis is a disease in which the bones lose minerals and strength with aging. This can result in serious bone fractures. Your risk for osteoporosis can be identified using a bone density scan.  If you are 5 years of age or older, or if you are at risk for osteoporosis and fractures, ask your health care provider if you should be screened.  Ask your health care provider whether you should take a calcium or vitamin D supplement to lower your risk for osteoporosis.  Menopause may have certain physical symptoms and risks.  Hormone replacement therapy may reduce some of these symptoms and risks. Talk to your health care provider about whether hormone replacement therapy is right for you. Follow these instructions at home:  Schedule regular health, dental, and eye exams.  Stay current with your immunizations.  Do not use any tobacco products including cigarettes, chewing tobacco, or electronic cigarettes.  If you are pregnant, do not drink alcohol.  If you are breastfeeding, limit how much and how often you drink alcohol.  Limit alcohol intake to no more than 1 drink per day for nonpregnant women. One drink equals 12 ounces of beer, 5 ounces of wine, or 1 ounces of hard liquor.  Do not use street drugs.  Do not share needles.  Ask  your health care provider for help if you need support or information about quitting drugs.  Tell your health care provider if you often feel depressed.  Tell your health care provider if you have ever been abused or do not feel safe at home. This information is not intended to replace advice given to you by your health care provider. Make sure you discuss any questions you have with your health care provider. Document Released: 09/13/2010 Document Revised: 08/06/2015 Document Reviewed: 12/02/2014 Elsevier Interactive Patient Education  2017 [place].      Bone Densitometry Bone densitometry is an imaging test that uses a special X-ray to measure the amount of calcium and other minerals in your bones (bone density). This test is also known as a bone mineral density test or dual-energy X-ray absorptiometry (DXA). The test can measure bone density at your hip and your spine. It is similar to having a regular X-ray. You may have this test to:  Diagnose a condition that causes weak or thin bones (osteoporosis).  Predict your risk of a broken bone (fracture).  Determine how well osteoporosis treatment is working. Tell a health care provider about:  Any allergies you have.  All medicines you are taking, including vitamins, herbs, eye drops, creams, and over-the-counter medicines.  Any problems you or family members have had with anesthetic medicines.  Any blood disorders you have.  Any surgeries you have had.  Any medical conditions you have.  Possibility of pregnancy.  Any other medical test you had within the previous 14 days that used contrast material. What are the risks? Generally, this is a safe procedure. However, problems can occur and may include the following:  This test exposes you to a very small amount of radiation.  The risks of radiation exposure may be greater to unborn children. What happens before the procedure?  Do not take any calcium supplements for  24 hours before having the test. You can otherwise eat and drink what you usually do.  Take off all metal jewelry, eyeglasses, dental appliances, and any other metal objects. What happens during the procedure?  You may lie on an exam table. There will be an X-ray generator below you and an imaging device above you.  Other devices, such as boxes or braces, may be used to position your body properly for the scan.  You will need to lie still while the machine slowly scans your body.  The images will show up on a computer monitor. What happens after the procedure? You may need more testing at a later time. This information is not intended to replace advice given to you by your health care provider. Make sure you discuss any questions you have with your health care provider. Document Released: 03/22/2004 Document Revised: 08/06/2015 Document Reviewed: 08/08/2013 Elsevier Interactive Patient Education  2017 [employer]  Fat and Cholesterol Restricted Diet High levels of fat and cholesterol in your blood may lead to various health problems, such as diseases of the heart, blood vessels, gallbladder, liver, and pancreas. Fats are concentrated sources of energy that come in various forms. Certain types of fat, including saturated fat, may be harmful in excess. Cholesterol is a substance needed by your body in small amounts. Your body makes all the cholesterol it needs. Excess cholesterol comes from the food you eat. When you have high levels of cholesterol and saturated fat in your blood, health problems can develop because the excess fat and cholesterol will gather along the walls of your blood vessels, causing them to narrow. Choosing the right foods will help you control your intake of fat and cholesterol. This will help keep the levels of these substances in your blood within normal limits and reduce your risk of disease. What is my plan? Your health care provider recommends that you:  Limit  your fat intake to ______% or less of your total calories per day.  Limit the amount of cholesterol in your diet to less than _________mg per day.  Eat 20-30 grams of fiber each day. What types of fat should I choose?  Choose healthy fats more often. Choose monounsaturated and polyunsaturated fats, such as olive and canola oil, flaxseeds, walnuts, almonds, and seeds.  Eat more omega-3 fats. Good choices include salmon, mackerel, sardines, tuna, flaxseed oil, and ground flaxseeds. Aim to eat fish at least two times a week.  Limit saturated fats. Saturated fats are primarily found in animal products, such as meats, butter, and cream. Plant sources of saturated fats include palm oil, palm kernel oil, and coconut oil.  Avoid foods with partially hydrogenated oils in them. These contain trans fats. Examples of foods that contain trans fats are stick margarine, some tub margarines, cookies, crackers, and other baked goods. What general guidelines do I need to follow? These guidelines for healthy eating will help you control your intake of fat and cholesterol:  Check food labels carefully to identify foods with trans fats or high amounts of saturated fat.  Fill one half of your plate with vegetables and green salads.  Fill one fourth of your plate with whole grains. Look for the word "whole" as the first word in the ingredient list.  Fill one fourth of your plate with lean protein foods.  Limit fruit to two servings a day. Choose fruit instead of juice.  Eat more foods that contain fiber, such as apples, broccoli, carrots, beans, peas, and barley.  Eat more home-cooked food and less restaurant, buffet, and fast food.  Limit or avoid alcohol.  Limit foods high in starch and sugar.  Limit fried foods.  Cook foods using methods other than frying. Baking, boiling, grilling, and broiling are all great options.  Lose weight if you are overweight. Losing just 5-10% of your initial body  weight can help your overall health and prevent diseases such as diabetes and heart disease. What foods can I eat? Grains   Whole grains, such as whole wheat or whole grain breads, crackers, cereals, and pasta. Unsweetened oatmeal, bulgur, barley, quinoa, or brown rice. Corn or whole wheat flour tortillas. Vegetables   Fresh or frozen vegetables (raw, steamed, roasted, or grilled). Green salads. Fruits   All fresh, canned (in natural juice), or frozen fruits. Meats and other protein foods   Ground beef (85% or leaner), grass-fed beef, or beef trimmed of fat. Skinless chicken or [place]. Ground chicken or [place]. Pork trimmed of fat. All fish and seafood. Eggs. Dried beans, peas, or lentils. Unsalted nuts or seeds. Unsalted canned or dry beans. Dairy   Low-fat dairy products, such as skim or 1% milk, 2% or reduced-fat cheeses, low-fat ricotta or cottage cheese, or plain low-fat yo Fats and oils   Tub margarines without trans fats. Light or reduced-fat mayonnaise and salad dressings. Avocado. Olive, canola, sesame, or safflower oils. Natural peanut or almond butter (choose ones without added sugar and oil). The items listed above may not be a complete list of recommended foods or beverages. Contact your dietitian for more options.  Foods to avoid Grains   White bread. White pasta. White rice. Cornbread. Bagels, pastries, and croissants. Crackers that contain trans fat. Vegetables   White potatoes. Corn. Creamed or fried vegetables. Vegetables in a cheese sauce. Fruits   Dried fruits. Canned fruit in light or heavy syrup. Fruit juice. Meats and other protein foods   Fatty cuts of meat. Ribs, chicken wings, bacon, sausage, bologna, salami, chitterlings, fatback, hot dogs, bratwurst, and packaged luncheon meats. Liver and organ meats. Dairy   Whole or 2% milk, cream, half-and-half, and cream cheese. Whole milk cheeses. Whole-fat or sweetened yogurt. Full-fat cheeses. Nondairy creamers  and whipped toppings. Processed cheese, cheese spreads, or cheese curds. Beverages   Alcohol. Sweetened drinks (such as sodas, lemonade, and fruit drinks or punches). Fats and oils   Butter, stick margarine, lard, shortening, ghee, or bacon fat. Coconut, palm kernel, or palm oils. Sweets and desserts   Corn syrup, sugars, honey, and molasses. Candy. Jam and jelly. Syrup. Sweetened cereals. Cookies, pies, cakes, donuts, muffins, and ice cream. The items listed above may not be a complete list of foods and beverages to avoid. Contact your dietitian for more information.  This information is not intended to replace advice given to you by your health care provider. Make sure you discuss any questions you have with your health care provider. Document Released: 02/28/2005 Document Revised: 03/21/2014 Document Reviewed: 05/29/2013 Elsevier Interactive Patient Education  2017 [place] Maintenance, Female Adopting a healthy lifestyle and getting preventive care can go a long way to promote health and wellness. Talk with your health care provider about what schedule of regular examinations is right for you. This is a good chance for you to check in with your provider about disease prevention and staying healthy. In between checkups, there are plenty of things you can do on your own. Experts have done a lot of research about which lifestyle changes and preventive measures are most likely to keep you healthy. Ask your health care provider for more information. Weight  and diet Eat a healthy diet  Be sure to include plenty of vegetables, fruits, low-fat dairy products, and lean protein.  Do not eat a lot of foods high in solid fats, added sugars, or salt.  Get regular exercise. This is one of the most important things you can do for your health.  Most adults should exercise for at least 150 minutes each week. The exercise should increase your heart rate and make you sweat  (moderate-intensity exercise).  Most adults should also do strengthening exercises at least twice a week. This is in addition to the moderate-intensity exercise. Maintain a healthy weight  Body mass index (BMI) is a measurement that can be used to identify possible weight problems. It estimates body fat based on height and weight. Your health care provider can help determine your BMI and help you achieve or maintain a healthy weight.  For females 25 years of age and older:  A BMI below 18.5 is considered underweight.  A BMI of 18.5 to 24.9 is normal.  A BMI of 25 to 29.9 is considered overweight.  A BMI of 30 and above is considered obese. Watch levels of cholesterol and blood lipids  You should start having your blood tested for lipids and cholesterol at 66 years of age, then have this test every 5 years.  You may need to have your cholesterol levels checked more often if:  Your lipid or cholesterol levels are high.  You are older than 65 years of age.  You are at high risk for heart disease. Cancer screening Lung Cancer  Lung cancer screening is recommended for adults 32-50 years old who are at high risk for lung cancer because of a history of smoking.  A yearly low-dose CT scan of the lungs is recommended for people who:  Currently smoke.  Have quit within the past 15 years.  Have at least a 30-pack-year history of smoking. A pack year is smoking an average of one pack of cigarettes a day for 1 year.  Yearly screening should continue until it has been 15 years since you quit.  Yearly screening should stop if you develop a health problem that would prevent you from having lung cancer treatment. Breast Cancer  Practice breast self-awareness. This means understanding how your breasts normally appear and feel.  It also means doing regular breast self-exams. Let your health care provider know about any changes, no matter how small.  If you are in your 20s or 30s, you  should have a clinical breast exam (CBE) by a health care provider every 1-3 years as part of a regular health exam.  If you are 43 or older, have a CBE every year. Also consider having a breast X-ray (mammogram) every year.  If you have a family history of breast cancer, talk to your health care provider about genetic screening.  If you are at high risk for breast cancer, talk to your health care provider about having an MRI and a mammogram every year.  Breast cancer gene (BRCA) assessment is recommended for women who have family members with BRCA-related cancers. BRCA-related cancers include:  Breast.  Ovarian.  Tubal.  Peritoneal cancers.  Results of the assessment will determine the need for genetic counseling and BRCA1 and BRCA2 testing. Cervical Cancer  Your health care provider may recommend that you be screened regularly for cancer of the pelvic organs (ovaries, uterus, and vagina). This screening involves a pelvic examination, including checking for microscopic changes to the surface of  your cervix (Pap test). You may be encouraged to have this screening done every 3 years, beginning at age 12.  For women ages 75-65, health care providers may recommend pelvic exams and Pap testing every 3 years, or they may recommend the Pap and pelvic exam, combined with testing for human papilloma virus (HPV), every 5 years. Some types of HPV increase your risk of cervical cancer. Testing for HPV may also be done on women of any age with unclear Pap test results.  Other health care providers may not recommend any screening for nonpregnant women who are considered low risk for pelvic cancer and who do not have symptoms. Ask your health care provider if a screening pelvic exam is right for you.  If you have had past treatment for cervical cancer or a condition that could lead to cancer, you need Pap tests and screening for cancer for at least 20 years after your treatment. If Pap tests have been  discontinued, your risk factors (such as having a new sexual partner) need to be reassessed to determine if screening should resume. Some women have medical problems that increase the chance of getting cervical cancer. In these cases, your health care provider may recommend more frequent screening and Pap tests. Colorectal Cancer  This type of cancer can be detected and often prevented.  Routine colorectal cancer screening usually begins at 66 years of age and continues through 66 years of age.  Your health care provider may recommend screening at an earlier age if you have risk factors for colon cancer.  Your health care provider may also recommend using home test kits to check for hidden blood in the stool.  A small camera at the end of a tube can be used to examine your colon directly (sigmoidoscopy or colonoscopy). This is done to check for the earliest forms of colorectal cancer.  Routine screening usually begins at age 10.  Direct examination of the colon should be repeated every 5-10 years through 66 years of age. However, you may need to be screened more often if early forms of precancerous polyps or small growths are found. Skin Cancer  Check your skin from head to toe regularly.  Tell your health care provider about any new moles or changes in moles, especially if there is a change in a mole's shape or color.  Also tell your health care provider if you have a mole that is larger than the size of a pencil eraser.  Always use sunscreen. Apply sunscreen liberally and repeatedly throughout the day.  Protect yourself by wearing long sleeves, pants, a wide-brimmed hat, and sunglasses whenever you are outside. Heart disease, diabetes, and high blood pressure  High blood pressure causes heart disease and increases the risk of stroke. High blood pressure is more likely to develop in:  People who have blood pressure in the high end of the normal range (130-139/85-89 mm Hg).  People  who are overweight or obese.  People who are African American.  If you are 78-47 years of age, have your blood pressure checked every 3-5 years. If you are 95 years of age or older, have your blood pressure checked every year. You should have your blood pressure measured twice-once when you are at a hospital or clinic, and once when you are not at a hospital or clinic. Record the average of the two measurements. To check your blood pressure when you are not at a hospital or clinic, you can use:  An automated blood  pressure machine at a pharmacy.  A home blood pressure monitor.  If you are between 56 years and 11 years old, ask your health care provider if you should take aspirin to prevent strokes.  Have regular diabetes screenings. This involves taking a blood sample to check your fasting blood sugar level.  If you are at a normal weight and have a low risk for diabetes, have this test once every three years after 66 years of age.  If you are overweight and have a high risk for diabetes, consider being tested at a younger age or more often. Preventing infection Hepatitis B  If you have a higher risk for hepatitis B, you should be screened for this virus. You are considered at high risk for hepatitis B if:  You were born in a country where hepatitis B is common. Ask your health care provider which countries are considered high risk.  Your parents were born in a high-risk country, and you have not been immunized against hepatitis B (hepatitis B vaccine).  You have HIV or AIDS.  You use needles to inject street drugs.  You live with someone who has hepatitis B.  You have had sex with someone who has hepatitis B.  You get hemodialysis treatment.  You take certain medicines for conditions, including cancer, organ transplantation, and autoimmune conditions. Hepatitis C  Blood testing is recommended for:  Everyone born from [birth year] through 1965.  Anyone with known risk factors for  hepatitis C. Sexually transmitted infections (STIs)  You should be screened for sexually transmitted infections (STIs) including gonorrhea and chlamydia if:  You are sexually active and are younger than 66 years of age.  You are older than 66 years of age and your health care provider tells you that you are at risk for this type of infection.  Your sexual activity has changed since you were last screened and you are at an increased risk for chlamydia or gonorrhea. Ask your health care provider if you are at risk.  If you do not have HIV, but are at risk, it may be recommended that you take a prescription medicine daily to prevent HIV infection. This is called pre-exposure prophylaxis (PrEP). You are considered at risk if:  You are sexually active and do not regularly use condoms or know the HIV status of your partner(s).  You take drugs by injection.  You are sexually active with a partner who has HIV. Talk with your health care provider about whether you are at high risk of being infected with HIV. If you choose to begin PrEP, you should first be tested for HIV. You should then be tested every 3 months for as long as you are taking PrEP. Pregnancy  If you are premenopausal and you may become pregnant, ask your health care provider about preconception counseling.  If you may become pregnant, take 400 to 800 micrograms (mcg) of folic acid every day.  If you want to prevent pregnancy, talk to your health care provider about birth control (contraception). Osteoporosis and menopause  Osteoporosis is a disease in which the bones lose minerals and strength with aging. This can result in serious bone fractures. Your risk for osteoporosis can be identified using a bone density scan.  If you are 49 years of age or older, or if you are at risk for osteoporosis and fractures, ask your health care provider if you should be screened.  Ask your health care provider whether you should take a calcium  or  vitamin D supplement to lower your risk for osteoporosis.  Menopause may have certain physical symptoms and risks.  Hormone replacement therapy may reduce some of these symptoms and risks. Talk to your health care provider about whether hormone replacement therapy is right for you. Follow these instructions at home:  Schedule regular health, dental, and eye exams.  Stay current with your immunizations.  Do not use any tobacco products including cigarettes, chewing tobacco, or electronic cigarettes.  If you are pregnant, do not drink alcohol.  If you are breastfeeding, limit how much and how often you drink alcohol.  Limit alcohol intake to no more than 1 drink per day for nonpregnant women. One drink equals 12 ounces of beer, 5 ounces of wine, or 1 ounces of hard liquor.  Do not use street drugs.  Do not share needles.  Ask your health care provider for help if you need support or information about quitting drugs.  Tell your health care provider if you often feel depressed.  Tell your health care provider if you have ever been abused or do not feel safe at home. This information is not intended to replace advice given to you by your health care provider. Make sure you discuss any questions you have with your health care provider. Document Released: 09/13/2010 Document Revised: 08/06/2015 Document Reviewed: 12/02/2014 Elsevier Interactive Patient Education  2017 [employer].

## 2016-06-30 NOTE — Progress Notes (Signed)
Pre visit review using our clinic review tool, if applicable. No additional management support is needed unless otherwise documented below in the visit note. 

## 2016-06-30 NOTE — Assessment & Plan Note (Signed)
Resolved.Declines referral to cardiology and will let me know if recurs.

## 2016-06-30 NOTE — Assessment & Plan Note (Signed)
Still awaiting records from [facility]. However reports colonoscopy up-to-date. Mammogram up-to-date. Patient and longer does Pap smear due to history of hysterectomy. Cholesterol screen, Hep C screen, and vitamin D ordered today. Prevnar given. DEXA ordered. Advised to get shingles expecting a local pharmacy. Encouraged continued exercise.

## 2016-07-06 ENCOUNTER — Encounter: Payer: Self-pay | Admitting: Family

## 2016-07-07 ENCOUNTER — Encounter: Payer: Self-pay | Admitting: Family

## 2016-07-07 NOTE — Progress Notes (Signed)
Subjective:   Kelly Mata is a 66 y.o. female who presents for an Initial Medicare Annual Wellness Visit.  Feeling well today.  Dizziness since resolved. Declines referral to cardiology.         Objective:    Today's Vitals   06/30/16 0732  BP: 132/78  Pulse: 73  Temp: 97.4 F (36.3 C)  TempSrc: Oral  SpO2: 99%  Weight: 155 lb 9.6 oz (70.6 kg)  Height: 5\' 1"  (1.549 m)   Body mass index is 29.4 kg/m.   Current Medications (verified) No outpatient encounter prescriptions on file as of 06/30/2016.   No facility-administered encounter medications on file as of 06/30/2016.     Allergies (verified) Ceclor [cefaclor] and Ampicillin   History: Past Medical History:  Diagnosis Date  . Allergy   . Cancer [facility]) 2009   left breast DCIS, 5 yrs tamoxifen  . Heart murmur 2008  . Squamous cell carcinoma    Past Surgical History:  Procedure Laterality Date  . ABDOMINAL HYSTERECTOMY  1998   complete   . BREAST BIOPSY  2008  . BREAST BIOPSY Left 2009   stereo breast biopsy  . BREAST BIOPSY  1998  . BREAST LUMPECTOMY Left 2009   radiation  . COLONOSCOPY  2013  . TONSILLECTOMY  1957   Family History  Problem Relation Age of Onset  . Cancer Other     cousin, breast  . Cancer Father     pancreatic  . Breast cancer Cousin 69   Social History   Occupational History  . Not on file.   Social History Main Topics  . Smoking status: Never Smoker  . Smokeless tobacco: Never Used  . Alcohol use Yes     Comment: 1/day  . Drug use: No  . Sexual activity: Not on file    Tobacco Counseling. Non smoker.     Immunizations and Health Maintenance : Prevnar today.  Immunization History  Administered Date(s) Administered  . Pneumococcal Conjugate-13 06/30/2016  . Td 08/13/2007   Health Maintenance Due  Topic Date Due  . Hepatitis C Screening  03-08-1951  . HIV Screening  08/04/1965  . PAP SMEAR  08/05/1971  . DEXA SCAN  08/05/2015    Patient Care  Team: Burnard Hawthorne, FNP as PCP - General (Family Medicine) Seeplaputhur Robinette Haines, MD (General Surgery) Eusebio Me, MD (Unknown Physician Specialty)  Indicate any recent Medical Services you may have received from other than [facility] providers in the past year (date may be approximate).     Assessment:   This is a routine wellness examination for Kelly Mata.   Hearing/Vision screen: No problems with hearing or vision. Up to date with ophthalmologist.  Recently had cataract surgery.   Dietary issues and exercise activities discussed: Exercises regularly.      Goals    None     Depression Screen PHQ 2/9 Scores 06/23/2016  PHQ - 2 Score 0    Fall Risk Fall Risk  06/23/2016 06/23/2016  Falls in the past year? No No    Cognitive Function: No complaints with memory.     Advanced Directives: Have left message with patient to assess status of Advanced Directives. Will update in chart.    Screening Tests Health Maintenance  Topic Date Due  . Hepatitis C Screening  [birth year]  . HIV Screening  08/04/1965  . PAP SMEAR  08/05/1971  . DEXA SCAN  08/05/2015  . INFLUENZA VACCINE  10/12/2016  . PNA vac  Low Risk Adult (2 of 2 - PPSV23) 06/30/2017  . MAMMOGRAM  02/02/2018  . TETANUS/TDAP  03/14/2021  . COLONOSCOPY  03/14/2024      Plan:   No concerns at this time.   During the course of the visit, Kelly Mata was educated and counseled about the following appropriate screening and preventive services:   Vaccines to include Pneumoccal, Influenza, Hepatitis B, Td, Zostavax, HCV  Electrocardiogram  Cardiovascular disease screening  Colorectal cancer screening  Bone density screening  Diabetes screening  Glaucoma screening  Mammography/PAP  Nutrition counseling  Smoking cessation counseling  Patient Instructions (the written plan) were given to the patient.    Kelly Paris, FNP   07/07/2016

## 2016-07-08 ENCOUNTER — Ambulatory Visit
Admission: RE | Admit: 2016-07-08 | Discharge: 2016-07-08 | Disposition: A | Discharge status: Home / Self Care | Payer: PPO | Source: Ambulatory Visit | Attending: Family | Admitting: Family

## 2016-07-08 DIAGNOSIS — R42 Dizziness and giddiness: Secondary | ICD-10-CM | POA: Insufficient documentation

## 2016-07-25 ENCOUNTER — Encounter: Payer: Self-pay | Admitting: Family

## 2016-08-04 ENCOUNTER — Other Ambulatory Visit (INDEPENDENT_AMBULATORY_CARE_PROVIDER_SITE_OTHER): Payer: PPO

## 2016-08-04 DIAGNOSIS — E559 Vitamin D deficiency, unspecified: Secondary | ICD-10-CM

## 2016-08-04 DIAGNOSIS — Z1159 Encounter for screening for other viral diseases: Secondary | ICD-10-CM

## 2016-08-04 DIAGNOSIS — Z0000 Encounter for general adult medical examination without abnormal findings: Secondary | ICD-10-CM

## 2016-08-04 LAB — HEPATITIS C ANTIBODY: HCV Ab: NEGATIVE

## 2016-08-04 LAB — LIPID PANEL
Cholesterol: 183 mg/dL (ref 0–200)
HDL: 62.4 mg/dL (ref >39.00)
LDL Cholesterol: 103 mg/dL — ABNORMAL HIGH (ref 0–99)
NonHDL: 120.61
TRIGLYCERIDES: 86 mg/dL (ref 0.0–149.0)
Total CHOL/HDL Ratio: 3
VLDL: 17.2 mg/dL (ref 0.0–40.0)

## 2016-08-04 LAB — VITAMIN D 25 HYDROXY (VIT D DEFICIENCY, FRACTURES): VITD: 21.47 ng/mL — ABNORMAL LOW (ref 30.00–100.00)

## 2016-09-08 ENCOUNTER — Encounter: Payer: Self-pay | Admitting: Family

## 2016-09-13 ENCOUNTER — Ambulatory Visit
Admission: RE | Admit: 2016-09-13 | Discharge: 2016-09-13 | Disposition: A | Discharge status: Home / Self Care | Payer: PPO | Source: Ambulatory Visit | Attending: Family | Admitting: Family

## 2016-09-13 DIAGNOSIS — Z13820 Encounter for screening for osteoporosis: Secondary | ICD-10-CM | POA: Diagnosis not present

## 2016-09-13 DIAGNOSIS — M8588 Other specified disorders of bone density and structure, other site: Secondary | ICD-10-CM | POA: Diagnosis not present

## 2016-09-13 DIAGNOSIS — N959 Unspecified menopausal and perimenopausal disorder: Secondary | ICD-10-CM | POA: Diagnosis not present

## 2016-09-13 DIAGNOSIS — M85852 Other specified disorders of bone density and structure, left thigh: Secondary | ICD-10-CM | POA: Insufficient documentation

## 2016-10-20 DIAGNOSIS — D485 Neoplasm of uncertain behavior of skin: Secondary | ICD-10-CM | POA: Diagnosis not present

## 2016-10-20 DIAGNOSIS — D225 Melanocytic nevi of trunk: Secondary | ICD-10-CM | POA: Diagnosis not present

## 2016-10-20 DIAGNOSIS — C44619 Basal cell carcinoma of skin of left upper limb, including shoulder: Secondary | ICD-10-CM | POA: Diagnosis not present

## 2016-10-20 DIAGNOSIS — C44719 Basal cell carcinoma of skin of left lower limb, including hip: Secondary | ICD-10-CM | POA: Diagnosis not present

## 2016-10-20 DIAGNOSIS — D2262 Melanocytic nevi of left upper limb, including shoulder: Secondary | ICD-10-CM | POA: Diagnosis not present

## 2016-10-20 DIAGNOSIS — Z85828 Personal history of other malignant neoplasm of skin: Secondary | ICD-10-CM | POA: Diagnosis not present

## 2016-10-20 DIAGNOSIS — D2271 Melanocytic nevi of right lower limb, including hip: Secondary | ICD-10-CM | POA: Diagnosis not present

## 2016-12-07 ENCOUNTER — Other Ambulatory Visit: Payer: Self-pay

## 2016-12-07 DIAGNOSIS — Z1231 Encounter for screening mammogram for malignant neoplasm of breast: Secondary | ICD-10-CM

## 2016-12-14 DIAGNOSIS — C44619 Basal cell carcinoma of skin of left upper limb, including shoulder: Secondary | ICD-10-CM | POA: Diagnosis not present

## 2016-12-28 DIAGNOSIS — C44719 Basal cell carcinoma of skin of left lower limb, including hip: Secondary | ICD-10-CM | POA: Diagnosis not present

## 2017-01-02 ENCOUNTER — Ambulatory Visit (INDEPENDENT_AMBULATORY_CARE_PROVIDER_SITE_OTHER): Payer: PPO | Admitting: Family

## 2017-01-02 ENCOUNTER — Encounter: Payer: Self-pay | Admitting: Family

## 2017-01-02 VITALS — BP 134/78 | HR 74 | Temp 98.0°F | Ht 61.0 in | Wt 160.4 lb

## 2017-01-02 DIAGNOSIS — G8929 Other chronic pain: Secondary | ICD-10-CM | POA: Diagnosis not present

## 2017-01-02 DIAGNOSIS — M25562 Pain in left knee: Secondary | ICD-10-CM

## 2017-01-02 MED ORDER — MELOXICAM 7.5 MG PO TABS: 7.5000 mg | ORAL_TABLET | Freq: Every day | ORAL | 0 refills | Status: DC

## 2017-01-02 NOTE — Progress Notes (Signed)
Subjective:    Patient ID: Kelly Mata, female    DOB: 1950/04/06, 66 y.o.   MRN: [id number]  CC: Kelly Mata is a 66 y.o. female who presents today for follow up.   HPI: Overall feeling well however reports chronic left knee for 3 years.    Has worsened lately with occasional 'flares'. Would like orthopedic consult as would consider surgical options.  Stairs bother knee. Feeling like may give way.  No swelling, fever, falls, injury, numbness or tingling. Taking ibuprofen 2-3 times per day. No h/o GIB. -     HISTORY:  Past Medical History:  Diagnosis Date  . Allergy   . Cancer [facility]) 2009   left breast DCIS, 5 yrs tamoxifen  . Heart murmur 2008  . Squamous cell carcinoma    Past Surgical History:  Procedure Laterality Date  . ABDOMINAL HYSTERECTOMY  1998   complete   . BREAST BIOPSY  2008  . BREAST BIOPSY Left 2009   stereo breast biopsy  . BREAST BIOPSY  1998  . BREAST LUMPECTOMY Left 2009   radiation  . COLONOSCOPY  2013  . TONSILLECTOMY  1957   Family History  Problem Relation Age of Onset  . Cancer Other        cousin, breast  . Cancer Father        pancreatic  . Breast cancer Cousin 40    Allergies: Ceclor [cefaclor] and Ampicillin No current outpatient prescriptions on file prior to visit.   No current facility-administered medications on file prior to visit.     Social History  Substance Use Topics  . Smoking status: Never Smoker  . Smokeless tobacco: Never Used  . Alcohol use Yes     Comment: 1/day    Review of Systems  Constitutional: Negative for chills and fever.  Respiratory: Negative for cough.   Cardiovascular: Negative for chest pain and palpitations.  Gastrointestinal: Negative for nausea and vomiting.  Musculoskeletal: Negative for back pain and myalgias.  Neurological: Negative for numbness.      Objective:    BP 134/78   Pulse 74   Temp 98 F (36.7 C) (Oral)   Ht 5\' 1"  (1.549 m)   Wt 160 lb 6.4 oz (72.8 kg)    SpO2 99%   BMI 30.31 kg/m  BP Readings from Last 3 Encounters:  01/02/17 134/78  06/30/16 132/78  06/23/16 122/82   Wt Readings from Last 3 Encounters:  01/02/17 160 lb 6.4 oz (72.8 kg)  06/30/16 155 lb 9.6 oz (70.6 kg)  06/23/16 157 lb (71.2 kg)    Physical Exam  Constitutional: She appears well-developed and well-nourished.  Eyes: Conjunctivae are normal.  Cardiovascular: Normal rate, regular rhythm, normal heart sounds and normal pulses.   Pulmonary/Chest: Effort normal and breath sounds normal. She has no wheezes. She has no rhonchi. She has no rales.  Musculoskeletal:       Left knee: She exhibits normal range of motion, no swelling, no effusion, no ecchymosis and no erythema. Tenderness found. Medial joint line tenderness noted.  Bilateral knees are symmetric. No effusion appreciated. No increase in warmth or erythema. Crepitus felt with flexion of bilateral knees.  Left knee:  Able to extend to -5 to 10 degrees and flex to 110 degrees. No catching with McMurray maneuver. No patellar apprehension. Negative anterior drawer and lachman's- no laxity appreciated.   No calf tenderness of lower leg edema bilaterally.    Neurological: She is alert.  Skin: Skin is  warm and dry.  Psychiatric: She has a normal mood and affect. Her speech is normal and behavior is normal. Thought content normal.  Vitals reviewed.      Assessment & Plan:   Problem List Items Addressed This Visit      Other   Chronic pain of left knee - Primary    Chronic. Worsening. Working diagnosis of meniscal etiology. Referral to orthopedics as requested. Trial of Mobic Discontinue over-the-counter NSAIDs. Will follow      Relevant Medications   meloxicam (MOBIC) 7.5 MG tablet   Other Relevant Orders   Ambulatory referral to Orthopedic Surgery       I am having Ms. Kirksey start on meloxicam.   Meds ordered this encounter  Medications  . meloxicam (MOBIC) 7.5 MG tablet    Sig: Take 1 tablet (7.5  mg total) by mouth daily.    Dispense:  90 tablet    Refill:  0    Order Specific Question:   Supervising Provider    Answer:   Crecencio Mc [[id number]]    Return precautions given.   Risks, benefits, and alternatives of the medications and treatment plan prescribed today were discussed, and patient expressed understanding.   Education regarding symptom management and diagnosis given to patient on AVS.  Continue to follow with Burnard Hawthorne, FNP for routine health maintenance.   Kelly Mata and I agreed with plan.   Mable Paris, FNP

## 2017-01-02 NOTE — Assessment & Plan Note (Signed)
Chronic. Worsening. Working diagnosis of meniscal etiology. Referral to orthopedics as requested. Trial of Mobic Discontinue over-the-counter NSAIDs. Will follow

## 2017-01-02 NOTE — Progress Notes (Signed)
Pre visit review using our clinic review tool, if applicable. No additional management support is needed unless otherwise documented below in the visit note. 

## 2017-01-02 NOTE — Patient Instructions (Signed)
Start mobic; take with food.   Do not take over the counter antiinflammatories ( such as Aleve, Motrin, ibuprofen etc) since mobic is in the same drug class.    Pleasure seeing you!

## 2017-01-10 DIAGNOSIS — R300 Dysuria: Secondary | ICD-10-CM | POA: Diagnosis not present

## 2017-01-10 DIAGNOSIS — G8929 Other chronic pain: Secondary | ICD-10-CM | POA: Diagnosis not present

## 2017-01-10 DIAGNOSIS — Z23 Encounter for immunization: Secondary | ICD-10-CM | POA: Diagnosis not present

## 2017-01-10 DIAGNOSIS — N390 Urinary tract infection, site not specified: Secondary | ICD-10-CM | POA: Diagnosis not present

## 2017-01-10 DIAGNOSIS — M1712 Unilateral primary osteoarthritis, left knee: Secondary | ICD-10-CM | POA: Diagnosis not present

## 2017-01-10 DIAGNOSIS — M25562 Pain in left knee: Secondary | ICD-10-CM | POA: Diagnosis not present

## 2017-02-07 ENCOUNTER — Ambulatory Visit
Admission: RE | Admit: 2017-02-07 | Discharge: 2017-02-07 | Disposition: A | Discharge status: Home / Self Care | Payer: PPO | Source: Ambulatory Visit | Attending: General Surgery | Admitting: General Surgery

## 2017-02-07 DIAGNOSIS — Z1231 Encounter for screening mammogram for malignant neoplasm of breast: Secondary | ICD-10-CM | POA: Diagnosis not present

## 2017-02-07 HISTORY — DX: Malignant neoplasm of unspecified site of unspecified female breast: C50.919

## 2017-02-13 ENCOUNTER — Encounter: Payer: Self-pay | Admitting: Family

## 2017-02-13 ENCOUNTER — Ambulatory Visit (INDEPENDENT_AMBULATORY_CARE_PROVIDER_SITE_OTHER): Payer: PPO | Admitting: General Surgery

## 2017-02-13 ENCOUNTER — Encounter: Payer: Self-pay | Admitting: General Surgery

## 2017-02-13 VITALS — BP 122/70 | Resp 12 | Ht 61.0 in | Wt 160.0 lb

## 2017-02-13 DIAGNOSIS — D0512 Intraductal carcinoma in situ of left breast: Secondary | ICD-10-CM

## 2017-02-13 NOTE — Progress Notes (Signed)
Patient ID: Kelly Mata, female   DOB: March 24, 1950, 66 y.o.   MRN: [id number]  Chief Complaint  Patient presents with  . Follow-up    HPI Kelly Mata is a 66 y.o. female who presents for a breast cancer follow up. The most recent mammogram was done on 02/06/2017.  Patient does perform regular self breast checks and gets regular mammograms done.    HPI  Past Medical History:  Diagnosis Date  . Allergy   . Breast cancer ([place]) 2009   neg  . Cancer [facility]) 2009   left breast DCIS, 5 yrs tamoxifen  . Heart murmur 2008  . Squamous cell carcinoma     Past Surgical History:  Procedure Laterality Date  . ABDOMINAL HYSTERECTOMY  1998   complete   . BREAST BIOPSY  2008  . BREAST BIOPSY Left 2009   stereo breast biopsy  . BREAST BIOPSY  1998  . BREAST LUMPECTOMY Left 2009   radiation  . COLONOSCOPY  2013  . TONSILLECTOMY  1957    Family History  Problem Relation Age of Onset  . Cancer Other        cousin, breast  . Cancer Father        pancreatic  . Breast cancer Cousin 46    Social History Social History   Tobacco Use  . Smoking status: Never Smoker  . Smokeless tobacco: Never Used  Substance Use Topics  . Alcohol use: Yes    Comment: 1/day  . Drug use: No    Allergies  Allergen Reactions  . Ceclor [Cefaclor] Hives  . Ampicillin Rash    No current outpatient medications on file.   No current facility-administered medications for this visit.     Review of Systems Review of Systems  Constitutional: Negative.   Respiratory: Negative.   Cardiovascular: Negative.     Blood pressure 122/70, resp. rate 12, height 5\' 1"  (1.549 m), weight 160 lb (72.6 kg).  Physical Exam Physical Exam  Constitutional: She is oriented to person, place, and time. She appears well-developed and well-nourished.  Eyes: Conjunctivae are normal. No scleral icterus.  Neck: Neck supple.  Cardiovascular: Normal rate, regular rhythm and normal heart sounds.  Pulmonary/Chest:  Effort normal and breath sounds normal. Right breast exhibits no inverted nipple, no mass, no nipple discharge, no skin change and no tenderness. Left breast exhibits no inverted nipple, no mass, no nipple discharge, no skin change and no tenderness.  Left breast incision is clean and well healed.   Lymphadenopathy:    She has no cervical adenopathy.    She has no axillary adenopathy.  Neurological: She is alert and oriented to person, place, and time.  Skin: Skin is warm and dry.    Data Reviewed Mammogram reviewed  Assessment       Stable exam. 9 years post left breast lumpectomy, completed radiation for DCIS and 5 years of Tamoxifen    Plan    Patient will be asked to return to the office in one year with a bilateral screening mammogram with Dr. Bary Castilla. The patient is aware to call back for any questions or concerns.     HPI, Physical Exam, Assessment and Plan have been scribed under the direction and in the presence of Mckinley Jewel, MD  Gaspar Cola, CMA  I have completed the exam and reviewed the above documentation for accuracy and completeness.  I agree with the above.  Haematologist has been used and any errors in dictation  or transcription are unintentional.  Seeplaputhur G. Jamal Collin, M.D., F.A.C.S.   Junie Panning G 02/13/2017, 11:25 AM

## 2017-02-13 NOTE — Patient Instructions (Addendum)
Patient will be asked to return to the office in one year with a bilateral screening mammogram with Dr. Bary Castilla. The patient is aware to call back for any questions or concerns.

## 2017-02-15 ENCOUNTER — Ambulatory Visit: Payer: Self-pay

## 2017-02-15 NOTE — Telephone Encounter (Signed)
Phone call from pt.  Reported onset of headache last Thursday, 11/29.  Reported starts just above base of skull in middle of back of head; sometimes radiates to both sides of head, but predominately, is on the right side; "feels like ants crawling on back of head"; intermittently radiates to jaw; "feels like an electrical pulse." Denied any neurological deficits; denied speech difficulty, unilateral weakness of extremities; dizziness, confusion, loss of vision or change in vision, or balance issues.  C/o upset stomach due to feeling anxious and fearful about symptoms, due to never having headaches before.  Appt. Scheduled for tomorrow @ [facility].  Encouraged pt. to rest tonight without any TV or Computer, and to keep environment very quiet, to reduce stimuli.  Encour. to avoid caffeine and alcohol.  Advised to go to ER tonight if symptoms worsen.  Verb. Understanding.         Reason for Disposition . [1] MODERATE headache (e.g., interferes with normal activities) AND [2] present > 24 hours AND [3] unexplained  (Exceptions: analgesics not tried, typical migraine, or headache part of viral illness)  Answer Assessment - Initial Assessment Questions 1. LOCATION: "Where does it hurt?"      Starts just above base of skull in middle of back of head; sometimes radiates to both sides of head, but predominately is on the right side; feels like ants crawling on back of head; intermittently radiates to jaw; like an electrical pulse.  2. ONSET: "When did the headache start?" (Minutes, hours or days)      Thursday, 11/29 3. PATTERN: "Does the pain come and go, or has it been constant since it started?"     Intermittent; today has been hurting all day; constant  4. SEVERITY: "How bad is the pain?" and "What does it keep you from doing?"  (e.g., Scale 1-10; mild, moderate, or severe)   - MILD (1-3): doesn't interfere with normal activities    - MODERATE (4-7): interferes with normal activities or awakens from  sleep    - SEVERE (8-10): excruciating pain, unable to do any normal activities        Moderate 7/10 5. RECURRENT SYMPTOM: "Have you ever had headaches before?" If so, ask: "When was the last time?" and "What happened that time?"      no 6. CAUSE: "What do you think is causing the headache?"     unknown 7. MIGRAINE: "Have you been diagnosed with migraine headaches?" If so, ask: "Is this headache similar?"      no 8. HEAD INJURY: "Has there been any recent injury to the head?"      No 9. OTHER SYMPTOMS: "Do you have any other symptoms?" (fever, stiff neck, eye pain, sore throat, cold symptoms)     No change in vision; no fevers; no other symptoms  10. PREGNANCY: "Is there any chance you are pregnant?" "When was your last menstrual period?"       no  Protocols used: HEADACHE-A-AH

## 2017-02-16 ENCOUNTER — Encounter: Payer: Self-pay | Admitting: Internal Medicine

## 2017-02-16 ENCOUNTER — Ambulatory Visit: Payer: PPO | Admitting: Internal Medicine

## 2017-02-16 VITALS — BP 124/82 | HR 79 | Temp 98.4°F | Wt 159.0 lb

## 2017-02-16 DIAGNOSIS — R519 Headache, unspecified: Secondary | ICD-10-CM

## 2017-02-16 DIAGNOSIS — R51 Headache: Secondary | ICD-10-CM | POA: Diagnosis not present

## 2017-02-16 MED ORDER — PREDNISONE 10 MG PO TABS: ORAL_TABLET | ORAL | 0 refills | Status: DC

## 2017-02-16 MED ORDER — VALACYCLOVIR HCL 1 G PO TABS: 1000.0000 mg | ORAL_TABLET | Freq: Three times a day (TID) | ORAL | 0 refills | Status: DC

## 2017-02-16 NOTE — Progress Notes (Signed)
Subjective:    Patient ID: Kelly Mata, female    DOB: [birth year], 66 y.o.   MRN: [id number]  HPI  Pt presents to the clinic today with c/o a headache. She reports this started 1 week ago. The pain started at the base of the right side of her head. She describes the pain as achy and pressure. She has associated tingling as well. The pain radiates into her right cheek. She denies dizziness, visual changes, difficulty swallowing or with speech. She denies any rash of the scalp or face. She denies any trauma to the head or neck. She has been taking Ibuprofen with minimal relief.   Review of Systems      Past Medical History:  Diagnosis Date  . Allergy   . Breast cancer ([place]) 2009   neg  . Cancer [facility]) 2009   left breast DCIS, 5 yrs tamoxifen  . Heart murmur 2008  . Squamous cell carcinoma     Current Outpatient Medications  Medication Sig Dispense Refill  . predniSONE (DELTASONE) 10 MG tablet Take 3 tabs on days 1-2, take 2 tabs on days 3-4, take 1 tab on days 5-6 12 tablet 0  . valACYclovir (VALTREX) 1000 MG tablet Take 1 tablet (1,000 mg total) by mouth 3 (three) times daily. 21 tablet 0   No current facility-administered medications for this visit.     Allergies  Allergen Reactions  . Ceclor [Cefaclor] Hives  . Ampicillin Rash    Family History  Problem Relation Age of Onset  . Cancer Other        cousin, breast  . Cancer Father        pancreatic  . Breast cancer Cousin 55    Social History   Socioeconomic History  . Marital status: Married    Spouse name: Not on file  . Number of children: Not on file  . Years of education: Not on file  . Highest education level: Not on file  Social Needs  . Financial resource strain: Not on file  . Food insecurity - worry: Not on file  . Food insecurity - inability: Not on file  . Transportation needs - medical: Not on file  . Transportation needs - non-medical: Not on file  Occupational History  . Not on file    Tobacco Use  . Smoking status: Never Smoker  . Smokeless tobacco: Never Used  Substance and Sexual Activity  . Alcohol use: Yes    Comment: 1/day  . Drug use: No  . Sexual activity: Not on file  Other Topics Concern  . 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 Denies fever, malaise, fatigue, or abrupt weight changes.  HEENT: Denies eye pain, eye redness, ear pain, ringing in the ears, wax buildup, runny nose, nasal congestion, bloody nose, or sore throat. Musculoskeletal: Denies decrease in range of motion, difficulty with gait, muscle pain or joint pain and swelling.  Skin: Denies redness, rashes, lesions or ulcercations.  Neurological: Denies dizziness, difficulty with memory, difficulty with speech or problems with balance and coordination.    No other specific complaints in a complete review of systems (except as listed in HPI above).  Objective:   Physical Exam   BP 124/82   Pulse 79   Temp 98.4 F (36.9 C) (Oral)   Wt 159 lb (  72.1 kg)   SpO2 98%   BMI 30.04 kg/m  Wt Readings from Last 3 Encounters:  02/16/17 159 lb (72.1 kg)  02/13/17 160 lb (72.6 kg)  01/02/17 160 lb 6.4 oz (72.8 kg)    General: Appears her stated age, well developed, well nourished in NAD. Skin: Warm, dry and intact. No rashes noted. HEENT: Head: normal shape and size; Eyes: sclera white, no icterus, conjunctiva pink, PERRLA and EOMs intact; Ears: Tm's gray and intact, normal light reflex;  Throat/Mouth: Teeth present, mucosa pink and moist, no exudate, lesions or ulcerations noted.  Neck:  No adenopathy noted. Musculoskeletal: Normal flexion, extension, rotation and lateral bending of the cervical spine. Neurological: Alert and oriented. . Coordination normal.    BMET    Component Value Date/Time   NA 140 06/23/2016 1128   K 3.8 06/23/2016 1128   CL 105 06/23/2016 1128   CO2 28  06/23/2016 1128   GLUCOSE 108 (H) 06/23/2016 1128   BUN 11 06/23/2016 1128   CREATININE 0.83 06/23/2016 1128   CALCIUM 9.6 06/23/2016 1128    Lipid Panel     Component Value Date/Time   CHOL 183 08/04/2016 0815   TRIG 86.0 08/04/2016 0815   HDL 62.40 08/04/2016 0815   CHOLHDL 3 08/04/2016 0815   VLDL 17.2 08/04/2016 0815   LDLCALC 103 (H) 08/04/2016 0815    CBC    Component Value Date/Time   WBC 9.4 06/23/2016 1128   RBC 4.21 06/23/2016 1128   HGB 12.7 06/23/2016 1128   HCT 38.7 06/23/2016 1128   PLT 270.0 06/23/2016 1128   MCV 91.8 06/23/2016 1128   MCHC 33.0 06/23/2016 1128   RDW 13.4 06/23/2016 1128   LYMPHSABS 1.4 06/23/2016 1128   MONOABS 0.5 06/23/2016 1128   EOSABS 0.0 06/23/2016 1128   BASOSABS 0.1 06/23/2016 1128    Hgb A1C Lab Results  Component Value Date   HGBA1C 5.7 06/23/2016           Assessment & Plan:   Acute Headache:  Concerning for shingles without the rash vs a trigeminal neuralgia Will treat with Valtrex 1 gm TID x 7 days and Pred Taper x 6 days Red flags discussed- none on exam today  Return precautions discussed Webb Silversmith, NP

## 2017-02-20 ENCOUNTER — Ambulatory Visit: Payer: PPO | Admitting: Family Medicine

## 2017-02-23 ENCOUNTER — Encounter: Payer: Self-pay | Admitting: Internal Medicine

## 2017-02-23 NOTE — Patient Instructions (Signed)

## 2017-04-17 ENCOUNTER — Encounter: Payer: Self-pay | Admitting: Family

## 2017-04-18 ENCOUNTER — Ambulatory Visit (INDEPENDENT_AMBULATORY_CARE_PROVIDER_SITE_OTHER): Payer: PPO | Admitting: Family Medicine

## 2017-04-18 ENCOUNTER — Encounter: Payer: Self-pay | Admitting: *Deleted

## 2017-04-18 ENCOUNTER — Encounter: Payer: Self-pay | Admitting: Family Medicine

## 2017-04-18 VITALS — BP 120/78 | HR 74 | Temp 97.9°F | Ht 61.0 in | Wt 160.0 lb

## 2017-04-18 DIAGNOSIS — S20461A Insect bite (nonvenomous) of right back wall of thorax, initial encounter: Secondary | ICD-10-CM

## 2017-04-18 DIAGNOSIS — W57XXXA Bitten or stung by nonvenomous insect and other nonvenomous arthropods, initial encounter: Secondary | ICD-10-CM

## 2017-04-18 MED ORDER — TRIAMCINOLONE ACETONIDE 0.5 % EX CREA: 1.0000 "application " | TOPICAL_CREAM | Freq: Two times a day (BID) | CUTANEOUS | 0 refills | Status: DC

## 2017-04-18 NOTE — Patient Instructions (Addendum)
Apply topical steroid cream to area twice daily x 2 weeks or until done.  Can use oral antihistamine as well like zyrtec.  Call if redness is spreading or fever.

## 2017-04-18 NOTE — Assessment & Plan Note (Signed)
Allergic reaction to bite. No clear boil/cellultitis. Treat with topical steroid cream and antihistmine.

## 2017-04-18 NOTE — Progress Notes (Signed)
   Subjective:    Patient ID: Kelly Mata, female    DOB: 1950-09-11, 67 y.o.   MRN: [id number]  HPI  67 year old female pt of Dr. Vidal Schwalbe present with new onset bote on right mid back.  3 days ago after a massage noted sharp sting/ burn on right mid back. Knot formed, redness.. Increase in size initially now stable.  This AM area is less sore, no discharge.  No fever, no flu-like illness. Feels well overall.   She has treated it with tea tree oil on the lesion.    She has hx of genital herpes... Flared up today.. Has valacyclovir.  Review of Systems  Constitutional: Negative for fatigue and fever.  HENT: Negative for ear pain.   Eyes: Negative for pain.  Respiratory: Negative for chest tightness and shortness of breath.   Cardiovascular: Negative for chest pain, palpitations and leg swelling.  Gastrointestinal: Negative for abdominal pain.  Genitourinary: Negative for dysuria.       Objective:   Physical Exam  Constitutional: Vital signs are normal. She appears well-developed and well-nourished. She is cooperative.  Non-toxic appearance. She does not appear ill. No distress.  HENT:  Head: Normocephalic.  Right Ear: Hearing, tympanic membrane, external ear and ear canal normal. Tympanic membrane is not erythematous, not retracted and not bulging.  Left Ear: Hearing, tympanic membrane, external ear and ear canal normal. Tympanic membrane is not erythematous, not retracted and not bulging.  Nose: No mucosal edema or rhinorrhea. Right sinus exhibits no maxillary sinus tenderness and no frontal sinus tenderness. Left sinus exhibits no maxillary sinus tenderness and no frontal sinus tenderness.  Mouth/Throat: Uvula is midline, oropharynx is clear and moist and mucous membranes are normal.  Eyes: Conjunctivae, EOM and lids are normal. Pupils are equal, round, and reactive to light. Lids are everted and swept, no foreign bodies found.  Neck: Trachea normal and normal range of  motion. Neck supple. Carotid bruit is not present. No thyroid mass and no thyromegaly present.  Cardiovascular: Normal rate, regular rhythm, S1 normal, S2 normal, normal heart sounds, intact distal pulses and normal pulses. Exam reveals no gallop and no friction rub.  No murmur heard. Pulmonary/Chest: Effort normal and breath sounds normal. No tachypnea. No respiratory distress. She has no decreased breath sounds. She has no wheezes. She has no rhonchi. She has no rales.  Abdominal: Soft. Normal appearance and bowel sounds are normal. There is no tenderness.  Neurological: She is alert.  Skin: Skin is warm, dry and intact. No rash noted.   Area of erythema and induration.. No central pore,on right mid back. no fluctuance   Psychiatric: Her speech is normal and behavior is normal. Judgment and thought content normal. Her mood appears not anxious. Cognition and memory are normal. She does not exhibit a depressed mood.          Assessment & Plan:

## 2017-05-04 DIAGNOSIS — Z85828 Personal history of other malignant neoplasm of skin: Secondary | ICD-10-CM | POA: Diagnosis not present

## 2017-05-04 DIAGNOSIS — D2261 Melanocytic nevi of right upper limb, including shoulder: Secondary | ICD-10-CM | POA: Diagnosis not present

## 2017-05-04 DIAGNOSIS — D2272 Melanocytic nevi of left lower limb, including hip: Secondary | ICD-10-CM | POA: Diagnosis not present

## 2017-05-04 DIAGNOSIS — L728 Other follicular cysts of the skin and subcutaneous tissue: Secondary | ICD-10-CM | POA: Diagnosis not present

## 2017-05-04 DIAGNOSIS — D225 Melanocytic nevi of trunk: Secondary | ICD-10-CM | POA: Diagnosis not present

## 2017-06-01 ENCOUNTER — Encounter: Payer: Self-pay | Admitting: Internal Medicine

## 2017-06-01 ENCOUNTER — Ambulatory Visit (INDEPENDENT_AMBULATORY_CARE_PROVIDER_SITE_OTHER): Payer: PPO | Admitting: Internal Medicine

## 2017-06-01 VITALS — BP 134/86 | HR 70 | Temp 97.9°F | Wt 159.0 lb

## 2017-06-01 DIAGNOSIS — R51 Headache: Secondary | ICD-10-CM | POA: Diagnosis not present

## 2017-06-01 DIAGNOSIS — R519 Headache, unspecified: Secondary | ICD-10-CM

## 2017-06-01 LAB — SEDIMENTATION RATE: SED RATE: 6 mm/h (ref 0–30)

## 2017-06-01 MED ORDER — VALACYCLOVIR HCL 1 G PO TABS: 1000.0000 mg | ORAL_TABLET | Freq: Three times a day (TID) | ORAL | 0 refills | Status: DC

## 2017-06-01 MED ORDER — KETOROLAC TROMETHAMINE 30 MG/ML IJ SOLN
30.0000 mg | Freq: Once | INTRAMUSCULAR | Status: AC
  Administered 2017-06-01: 30 mg via INTRAMUSCULAR

## 2017-06-01 NOTE — Progress Notes (Signed)
Subjective:    Patient ID: Kelly Mata, female    DOB: January 10, 1951, 67 y.o.   MRN: [id number]  HPI  Pt presents to the clinic today with c/o a headache. This started 5 days ago. It was intermittent but is now more constant. It starts on the right side of her head. She describes the pain as throbbing. She has tingling on the right side of her face. She denies dizziness or visual disturbances. She has had some nausea but no vomiting. She has not noticed any rashes. She denies neck pain or trauma. She has tried Tylenol without any relief. She had a similar episode 02/2017. She was treated for Trigeminal Neuralgia vs Herpes Zoster without the Rash, with Valtrex and Prednisone. She thinks the Valtrex is really what made her headache go away that time. She never had a rash on her scalp but did have a shingles type rash that popped up on her abdomen a few days later.  Review of Systems  Past Medical History:  Diagnosis Date  . Allergy   . Breast cancer ([place]) 2009   neg  . Cancer [facility]) 2009   left breast DCIS, 5 yrs tamoxifen  . Heart murmur 2008  . Squamous cell carcinoma     Current Outpatient Medications  Medication Sig Dispense Refill  . ibuprofen (ADVIL,MOTRIN) 200 MG tablet Take 200 mg by mouth every 6 (six) hours as needed.    . valACYclovir (VALTREX) 1000 MG tablet Take 1 tablet (1,000 mg total) by mouth 3 (three) times daily. 21 tablet 0   No current facility-administered medications for this visit.     Allergies  Allergen Reactions  . Ceclor [Cefaclor] Hives  . Ampicillin Rash    Family History  Problem Relation Age of Onset  . Cancer Other        cousin, breast  . Cancer Father        pancreatic  . Breast cancer Cousin 89    Social History   Socioeconomic History  . Marital status: Married    Spouse name: Not on file  . Number of children: Not on file  . Years of education: Not on file  . Highest education level: Not on file  Occupational History  . Not  on file  Social Needs  . Financial resource strain: Not on file  . Food insecurity:    Worry: Not on file    Inability: Not on file  . Transportation needs:    Medical: Not on file    Non-medical: Not on file  Tobacco Use  . Smoking status: Never Smoker  . Smokeless tobacco: Never Used  Substance and Sexual Activity  . Alcohol use: Yes    Comment: 1/day  . Drug use: No  . Sexual activity: Not on file  Lifestyle  . Physical activity:    Days per week: Not on file    Minutes per session: Not on file  . Stress: Not on file  Relationships  . Social connections:    Talks on phone: Not on file    Gets together: Not on file    Attends religious service: Not on file    Active member of club or organization: Not on file    Attends meetings of clubs or organizations: Not on file    Relationship status: Not on file  . Intimate partner violence:    Fear of current or ex partner: Not on file    Emotionally abused: Not on file  Physically abused: Not on file    Forced sexual activity: Not on file  Other Topics Concern  . 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 Denies fever, malaise, fatigue, or abrupt weight changes.  HEENT: Denies eye pain, eye redness, ear pain, ringing in the ears, wax buildup, runny nose, nasal congestion, bloody nose, or sore throat. Gastrointestinal: Pt reports nausea. Denies abdominal pain, bloating, constipation, diarrhea or blood in the stool.  Musculoskeletal: Denies decrease in range of motion, difficulty with gait, muscle pain or joint pain and swelling.  Skin: Denies redness, rashes, lesions or ulcercations.  Neurological: Pt reports tingling of face. Denies dizziness, difficulty with memory, difficulty with speech or problems with balance and coordination.   No other specific complaints in a complete review of systems (except as listed in HPI  above).     Objective:   Physical Exam    BP 134/86   Pulse 70   Temp 97.9 F (36.6 C) (Oral)   Wt 159 lb (72.1 kg)   SpO2 98%   BMI 30.04 kg/m  Wt Readings from Last 3 Encounters:  06/01/17 159 lb (72.1 kg)  04/18/17 160 lb (72.6 kg)  02/16/17 159 lb (72.1 kg)    General: Appears her stated age, well developed, well nourished in NAD. Skin: Warm, dry and intact. No rashesnoted. HEENT: Head: normal shape and size; Eyes: sclera white, no icterus, conjunctiva pink, PERRLA and EOMs intact; Ears: Tm's gray and intact, normal light reflex;  Abdomen: Soft and nontender.  Musculoskeletal: Normal flexion, extension and rotation of the cervical spine. No bony tenderness noted over the cervical spine. Neurological: Alert and oriented. Cranial nerves II-XII grossly intact.    BMET    Component Value Date/Time   NA 140 06/23/2016 1128   K 3.8 06/23/2016 1128   CL 105 06/23/2016 1128   CO2 28 06/23/2016 1128   GLUCOSE 108 (H) 06/23/2016 1128   BUN 11 06/23/2016 1128   CREATININE 0.83 06/23/2016 1128   CALCIUM 9.6 06/23/2016 1128    Lipid Panel     Component Value Date/Time   CHOL 183 08/04/2016 0815   TRIG 86.0 08/04/2016 0815   HDL 62.40 08/04/2016 0815   CHOLHDL 3 08/04/2016 0815   VLDL 17.2 08/04/2016 0815   LDLCALC 103 (H) 08/04/2016 0815    CBC    Component Value Date/Time   WBC 9.4 06/23/2016 1128   RBC 4.21 06/23/2016 1128   HGB 12.7 06/23/2016 1128   HCT 38.7 06/23/2016 1128   PLT 270.0 06/23/2016 1128   MCV 91.8 06/23/2016 1128   MCHC 33.0 06/23/2016 1128   RDW 13.4 06/23/2016 1128   LYMPHSABS 1.4 06/23/2016 1128   MONOABS 0.5 06/23/2016 1128   EOSABS 0.0 06/23/2016 1128   BASOSABS 0.1 06/23/2016 1128    Hgb A1C Lab Results  Component Value Date   HGBA1C 5.7 06/23/2016          Assessment & Plan:   Acute Headache:  Trigeminal Neuralgia, Recurrent Zoster, vs possible Pinched Nerve Discussed repeating Prednisone and Valtrex, she wants to  hold off on Prednisone now Will check Herpes Zoster IgG and IgM today eRx for Valtrex 1 gm TID x 7 days Discussed cervical spine xray in case we need MRI cervical spine in the future, she declines today She would like a CT scan  of her head, after discussing, we will hold off to see if treatment is effective Toradol 30 mg IM today  Will follow up after labs, return/ER precautions discussed Webb Silversmith, NP

## 2017-06-03 ENCOUNTER — Encounter: Payer: Self-pay | Admitting: Internal Medicine

## 2017-06-03 NOTE — Patient Instructions (Signed)

## 2017-06-04 LAB — VARICELLA ZOSTER ANTIBODY, IGG: Varicella IgG: 1416 index

## 2017-06-04 LAB — VARICELLA ZOSTER ANTIBODY, IGM

## 2017-06-06 ENCOUNTER — Encounter: Payer: Self-pay | Admitting: Internal Medicine

## 2017-06-06 DIAGNOSIS — R209 Unspecified disturbances of skin sensation: Principal | ICD-10-CM

## 2017-06-06 DIAGNOSIS — R202 Paresthesia of skin: Secondary | ICD-10-CM

## 2017-06-06 DIAGNOSIS — R51 Headache: Secondary | ICD-10-CM

## 2017-06-06 DIAGNOSIS — R519 Headache, unspecified: Secondary | ICD-10-CM

## 2017-06-07 ENCOUNTER — Telehealth: Payer: Self-pay

## 2017-06-07 DIAGNOSIS — R209 Unspecified disturbances of skin sensation: Principal | ICD-10-CM

## 2017-06-07 DIAGNOSIS — R202 Paresthesia of skin: Secondary | ICD-10-CM

## 2017-06-07 NOTE — Telephone Encounter (Signed)
Labs entered.

## 2017-06-07 NOTE — Telephone Encounter (Signed)
Can they draw it there or do I need to put in orders for her to do it here before the CT scan?

## 2017-06-07 NOTE — Addendum Note (Signed)
Addended by: Jearld Fenton on: 06/07/2017 04:41 PM   Modules accepted: Orders

## 2017-06-07 NOTE — Telephone Encounter (Signed)
Spoke with Scheduling and they said labs can be done at [facility] outpatient center on [address], that is where she going to get her imaging done. They said just to put orders in her chart and they will be able to see them. I have left message for patient to call me back to discuss all of this too. Thank Edrick Kins, RMA

## 2017-06-07 NOTE — Telephone Encounter (Signed)
Patient is scheduled for CT scans but needs to have BUN and Creatinine levels checked prior to the imaging. Please review.-Kelly Mata V Kelly Mata, RMA

## 2017-06-09 MED ORDER — PREDNISONE 20 MG PO TABS: 40.0000 mg | ORAL_TABLET | Freq: Every day | ORAL | 0 refills | Status: DC

## 2017-06-09 NOTE — Addendum Note (Signed)
Addended by: Jearld Fenton on: 06/09/2017 01:06 PM   Modules accepted: Orders

## 2017-06-12 ENCOUNTER — Telehealth: Payer: Self-pay | Admitting: Internal Medicine

## 2017-06-12 DIAGNOSIS — G510 Bell's palsy: Secondary | ICD-10-CM

## 2017-06-12 DIAGNOSIS — R209 Unspecified disturbances of skin sensation: Secondary | ICD-10-CM

## 2017-06-12 DIAGNOSIS — R202 Paresthesia of skin: Secondary | ICD-10-CM

## 2017-06-12 NOTE — Telephone Encounter (Signed)
Copied from [place] [id number]. Topic: Quick Communication - See Telephone Encounter >> Jun 12, 2017 11:34 AM Ahmed Prima L wrote: CRM for notification. See Telephone encounter for: 06/12/17.  Stephanie from [place] [facility] called and said that they need the order changed to a CSPINE without contrast. They do not give contrast for CSPINE patients. Please advise.Change in epic

## 2017-06-12 NOTE — Addendum Note (Signed)
Addended by: Jearld Fenton on: 06/12/2017 12:05 PM   Modules accepted: Orders

## 2017-06-12 NOTE — Telephone Encounter (Signed)
Order entered

## 2017-06-13 ENCOUNTER — Telehealth: Payer: Self-pay

## 2017-06-13 ENCOUNTER — Other Ambulatory Visit: Payer: Self-pay | Admitting: Internal Medicine

## 2017-06-13 DIAGNOSIS — R202 Paresthesia of skin: Secondary | ICD-10-CM

## 2017-06-13 DIAGNOSIS — R209 Unspecified disturbances of skin sensation: Principal | ICD-10-CM

## 2017-06-13 NOTE — Telephone Encounter (Signed)
Spoke with imaging center today to try and get patient in sooner than 06/19/17. They did ask for CT Head order to be changed to Without Contrast only please. They said for with contrast usually used for ataxia, infection, mass, HIV or thrombosis issue and with patient's symptoms from what they could see does not match it. Thank you New appointment is 06/14/17 (tomorrow) patient Kelly Mata, RMA

## 2017-06-13 NOTE — Telephone Encounter (Signed)
I would like the CT head with and without contrast if possible. I changed the CT cervical spine to without contrast per request from yesterday.

## 2017-06-13 NOTE — Telephone Encounter (Signed)
Called Imaging place and advised them that order needs to stay with and without if possible. Note was added and if radiology department has questions they will call back scheduler said (carrie ?)-Kris Mouton, RMA

## 2017-06-14 ENCOUNTER — Ambulatory Visit (HOSPITAL_COMMUNITY)
Admission: RE | Admit: 2017-06-14 | Discharge: 2017-06-14 | Disposition: A | Discharge status: Home / Self Care | Payer: PPO | Source: Ambulatory Visit | Attending: Internal Medicine | Admitting: Internal Medicine

## 2017-06-14 ENCOUNTER — Encounter: Payer: Self-pay | Admitting: Internal Medicine

## 2017-06-14 ENCOUNTER — Encounter (HOSPITAL_COMMUNITY): Payer: Self-pay

## 2017-06-14 DIAGNOSIS — R51 Headache: Secondary | ICD-10-CM | POA: Diagnosis not present

## 2017-06-14 DIAGNOSIS — R209 Unspecified disturbances of skin sensation: Secondary | ICD-10-CM | POA: Diagnosis present

## 2017-06-14 DIAGNOSIS — M899 Disorder of bone, unspecified: Secondary | ICD-10-CM | POA: Diagnosis not present

## 2017-06-14 DIAGNOSIS — R202 Paresthesia of skin: Secondary | ICD-10-CM

## 2017-06-14 DIAGNOSIS — M50322 Other cervical disc degeneration at C5-C6 level: Secondary | ICD-10-CM | POA: Diagnosis not present

## 2017-06-14 DIAGNOSIS — M542 Cervicalgia: Secondary | ICD-10-CM | POA: Diagnosis not present

## 2017-06-15 ENCOUNTER — Ambulatory Visit: Payer: PPO | Admitting: Internal Medicine

## 2017-06-19 ENCOUNTER — Inpatient Hospital Stay: Admission: RE | Admit: 2017-06-19 | Payer: PPO | Source: Ambulatory Visit

## 2017-06-19 ENCOUNTER — Ambulatory Visit (INDEPENDENT_AMBULATORY_CARE_PROVIDER_SITE_OTHER): Payer: PPO | Admitting: Family

## 2017-06-19 ENCOUNTER — Encounter: Payer: Self-pay | Admitting: Family

## 2017-06-19 ENCOUNTER — Ambulatory Visit: Admission: RE | Admit: 2017-06-19 | Payer: PPO | Source: Ambulatory Visit

## 2017-06-19 VITALS — BP 118/76 | HR 88 | Temp 98.1°F | Resp 15 | Ht 61.0 in | Wt 158.0 lb

## 2017-06-19 DIAGNOSIS — R51 Headache: Secondary | ICD-10-CM

## 2017-06-19 DIAGNOSIS — Z853 Personal history of malignant neoplasm of breast: Secondary | ICD-10-CM | POA: Diagnosis not present

## 2017-06-19 DIAGNOSIS — R519 Headache, unspecified: Secondary | ICD-10-CM | POA: Insufficient documentation

## 2017-06-19 NOTE — Assessment & Plan Note (Signed)
Etiology of headache is nonspecific at this time.  Discussed with patient working diagnosis of trigeminal neuralgia however she presents atypically for this as her pain appears to come from the posterior aspect of her head , ? Greater occipital nerve.  She politely declines trial of  Gabapentin today.  We discussed the next appropriate step would be to see neurology. Placed urgent referral and patient will let me know if any acute changes of symptoms.

## 2017-06-19 NOTE — Patient Instructions (Addendum)
Labs when fasting  Today we discussed referrals, orders. Surgery, neurology    I have placed these orders in the system for you.  Please be sure to give [place] a call if you have not heard from our office regarding scheduling a test or regarding referral in a timely manner.  It is very important that you let me know as soon as possible.    Let me know if any new or worsening symptoms

## 2017-06-19 NOTE — Assessment & Plan Note (Addendum)
Left parietal bone lesions. H/o breast cancer. Urgent referral placed to oncology for further evaluation, ( PET scan?). Will follow.

## 2017-06-19 NOTE — Progress Notes (Signed)
Subjective:    Patient ID: Kelly Mata, female    DOB: 25-Nov-1950, 67 y.o.   MRN: [id number]  CC: Kelly Mata is a 67 y.o. female who presents today for follow up.   HPI: Continues to have a HA. Unchanged.  Describes as right sided, feels numbness on right side of face. Pain seems to stem from back of head. Today HA is 3/10.  No rash. No vision changes. She is not phonophobic, photophobic. HA is everyday. No triggers. No h/o HA. No arm pain , numbness in arms.  No falls, gait or balance disturbances. Taking 400mg  ibuprofen daily with some relief.  Notes there has been 3 days where didn't HA have HA in past month.   Patient was seen by colleague about a week ago and had a CT head and CT cervical spine.  No acute intracranial or spinal abnormality's.  There were 2 small ill-defined lytic foci in the left parietal bone, larger lesion 10 mm in diameter.  Patient has a history of Kelly Mata   Notes first HA which started in December 2018, valtrex and prednisone. Had rash at that time. Recurred 05/26/17 and has not gone away.  Labs didn't show shingles. 05/2017 HISTORY:  Past Medical History:  Diagnosis Date  . Allergy   . Kelly Mata ([place]) 2009   neg  . Mata [facility]) 2009   left Kelly DCIS, 5 yrs tamoxifen  . Heart murmur 2008  . Squamous cell carcinoma    Past Surgical History:  Procedure Laterality Date  . ABDOMINAL HYSTERECTOMY  1998   complete   . Kelly BIOPSY  2008  . Kelly BIOPSY Left 2009   stereo Kelly biopsy  . Kelly BIOPSY  1998  . Kelly LUMPECTOMY Left 2009   radiation  . COLONOSCOPY  2013  . TONSILLECTOMY  1957   Family History  Problem Relation Age of Onset  . Mata Other        cousin, Kelly  . Mata Father        pancreatic  . Kelly Mata Cousin 40    Allergies: Ceclor [cefaclor] and Ampicillin Current Outpatient Medications on File Prior to Visit  Medication Sig Dispense Refill  . ibuprofen (ADVIL,MOTRIN) 200 MG tablet Take 200 mg  by mouth every 6 (six) hours as needed.     No current facility-administered medications on file prior to visit.     Social History   Tobacco Use  . Smoking status: Never Smoker  . Smokeless tobacco: Never Used  Substance Use Topics  . Alcohol use: Yes    Comment: 1/day  . Drug use: No    Review of Systems  Constitutional: Negative for chills and fever.  Respiratory: Negative for cough.   Cardiovascular: Negative for chest pain and palpitations.  Gastrointestinal: Negative for nausea and vomiting.  Neurological: Positive for numbness and headaches.      Objective:    BP 118/76 (BP Location: Left Arm, Patient Position: Sitting, Cuff Size: Large)   Pulse 88   Temp 98.1 F (36.7 C) (Oral)   Resp 15   Ht 5\' 1"  (1.549 m)   Wt 158 lb (71.7 kg)   SpO2 98%   BMI 29.85 kg/m  BP Readings from Last 3 Encounters:  06/19/17 118/76  06/01/17 134/86  04/18/17 120/78   Wt Readings from Last 3 Encounters:  06/19/17 158 lb (71.7 kg)  06/01/17 159 lb (72.1 kg)  04/18/17 160 lb (72.6 kg)    Physical Exam  Constitutional: She appears well-developed and well-nourished.  HENT:  Head:    No rash.  Area marked on diagram the patient describes most intensity of pain.  Eyes: Conjunctivae are normal.  Cardiovascular: Normal rate, regular rhythm, normal heart sounds and normal pulses.  Pulmonary/Chest: Effort normal and breath sounds normal. She has no wheezes. She has no rhonchi. She has no rales.  Neurological: She is alert.  Skin: Skin is warm and dry.  Psychiatric: She has a normal mood and affect. Her speech is normal and behavior is normal. Thought content normal.  Vitals reviewed.      Assessment & Plan:   Problem List Items Addressed This Visit      Other   History of Kelly Mata    Left parietal bone lesions. H/o Kelly Mata. Urgent referral placed to oncology for further evaluation, ( PET scan?). Will follow.       Relevant Orders   Ambulatory referral to  General Surgery   Ambulatory referral to Hematology / Oncology   Intractable headache - Primary    Etiology of headache is nonspecific at this time.  Discussed with patient working diagnosis of trigeminal neuralgia however she presents atypically for this as her pain appears to come from the posterior aspect of her head , ? Greater occipital nerve.  She politely declines trial of  Gabapentin today.  We discussed the next appropriate step would be to see neurology. Placed urgent referral and patient will let me know if any acute changes of symptoms.       Relevant Orders   Ambulatory referral to Neurology   CBC with Differential/Platelet   Comprehensive metabolic panel   Hemoglobin A1c   Lipid panel   TSH   VITAMIN D 25 Hydroxy (Vit-D Deficiency, Fractures)   B12 and Folate Panel       I have discontinued [name]. Thum's valACYclovir and predniSONE. I am also having her maintain her ibuprofen.   No orders of the defined types were placed in this encounter.   Return precautions given.   Risks, benefits, and alternatives of the medications and treatment plan prescribed today were discussed, and patient expressed understanding.   Education regarding symptom management and diagnosis given to patient on AVS.  Continue to follow with Burnard Hawthorne, FNP for routine health maintenance.   Kelly Mata and I agreed with plan.   Kelly Paris, FNP

## 2017-06-20 NOTE — Progress Notes (Signed)
Patient has appointment with neurology June 5th [facility] and she will see oncology April 11th @830am .

## 2017-06-22 ENCOUNTER — Encounter: Payer: Self-pay | Admitting: Hematology and Oncology

## 2017-06-22 ENCOUNTER — Ambulatory Visit
Admission: RE | Admit: 2017-06-22 | Discharge: 2017-06-22 | Disposition: A | Discharge status: Home / Self Care | Payer: PPO | Source: Ambulatory Visit | Attending: Urgent Care | Admitting: Urgent Care

## 2017-06-22 ENCOUNTER — Inpatient Hospital Stay: Payer: PPO | Attending: Hematology and Oncology | Admitting: Hematology and Oncology

## 2017-06-22 ENCOUNTER — Inpatient Hospital Stay: Payer: PPO

## 2017-06-22 ENCOUNTER — Other Ambulatory Visit: Payer: Self-pay

## 2017-06-22 ENCOUNTER — Ambulatory Visit
Admission: RE | Admit: 2017-06-22 | Discharge: 2017-06-22 | Disposition: A | Discharge status: Home / Self Care | Payer: PPO | Source: Ambulatory Visit | Attending: Hematology and Oncology | Admitting: Hematology and Oncology

## 2017-06-22 VITALS — BP 130/88 | HR 77 | Temp 97.4°F | Resp 18 | Ht 61.0 in | Wt 156.2 lb

## 2017-06-22 DIAGNOSIS — M50322 Other cervical disc degeneration at C5-C6 level: Secondary | ICD-10-CM | POA: Insufficient documentation

## 2017-06-22 DIAGNOSIS — Z803 Family history of malignant neoplasm of breast: Secondary | ICD-10-CM | POA: Insufficient documentation

## 2017-06-22 DIAGNOSIS — R011 Cardiac murmur, unspecified: Secondary | ICD-10-CM | POA: Diagnosis not present

## 2017-06-22 DIAGNOSIS — M899 Disorder of bone, unspecified: Secondary | ICD-10-CM

## 2017-06-22 DIAGNOSIS — R5383 Other fatigue: Secondary | ICD-10-CM | POA: Diagnosis not present

## 2017-06-22 DIAGNOSIS — D1771 Benign lipomatous neoplasm of kidney: Secondary | ICD-10-CM | POA: Insufficient documentation

## 2017-06-22 DIAGNOSIS — D0512 Intraductal carcinoma in situ of left breast: Secondary | ICD-10-CM | POA: Diagnosis not present

## 2017-06-22 DIAGNOSIS — Z800 Family history of malignant neoplasm of digestive organs: Secondary | ICD-10-CM | POA: Insufficient documentation

## 2017-06-22 DIAGNOSIS — Z86000 Personal history of in-situ neoplasm of breast: Secondary | ICD-10-CM | POA: Insufficient documentation

## 2017-06-22 DIAGNOSIS — Z809 Family history of malignant neoplasm, unspecified: Secondary | ICD-10-CM | POA: Diagnosis not present

## 2017-06-22 DIAGNOSIS — Z923 Personal history of irradiation: Secondary | ICD-10-CM

## 2017-06-22 DIAGNOSIS — G939 Disorder of brain, unspecified: Secondary | ICD-10-CM

## 2017-06-22 DIAGNOSIS — R21 Rash and other nonspecific skin eruption: Secondary | ICD-10-CM | POA: Insufficient documentation

## 2017-06-22 DIAGNOSIS — Z85828 Personal history of other malignant neoplasm of skin: Secondary | ICD-10-CM | POA: Diagnosis not present

## 2017-06-22 LAB — CBC WITH DIFFERENTIAL/PLATELET
BASOS PCT: 1 %
Basophils Absolute: 0.1 10*3/uL (ref 0–0.1)
EOS ABS: 0.1 10*3/uL (ref 0–0.7)
Eosinophils Relative: 1 %
HCT: 37.4 % (ref 35.0–47.0)
Hemoglobin: 12.7 g/dL (ref 12.0–16.0)
Lymphocytes Relative: 18 %
Lymphs Abs: 1.3 10*3/uL (ref 1.0–3.6)
MCH: 30.9 pg (ref 26.0–34.0)
MCHC: 34.1 g/dL (ref 32.0–36.0)
MCV: 90.8 fL (ref 80.0–100.0)
MONOS PCT: 7 %
Monocytes Absolute: 0.5 10*3/uL (ref 0.2–0.9)
NEUTROS PCT: 73 %
Neutro Abs: 5.1 10*3/uL (ref 1.4–6.5)
Platelets: 249 10*3/uL (ref 150–440)
RBC: 4.12 MIL/uL (ref 3.80–5.20)
RDW: 13 % (ref 11.5–14.5)
WBC: 7.1 10*3/uL (ref 3.6–11.0)

## 2017-06-22 LAB — COMPREHENSIVE METABOLIC PANEL
ALK PHOS: 71 U/L (ref 38–126)
ALT: 13 U/L — ABNORMAL LOW (ref 14–54)
AST: 15 U/L (ref 15–41)
Albumin: 4.2 g/dL (ref 3.5–5.0)
Anion gap: 9 (ref 5–15)
BILIRUBIN TOTAL: 0.6 mg/dL (ref 0.3–1.2)
BUN: 16 mg/dL (ref 6–20)
CALCIUM: 9.5 mg/dL (ref 8.9–10.3)
CO2: 23 mmol/L (ref 22–32)
CREATININE: 0.9 mg/dL (ref 0.44–1.00)
Chloride: 105 mmol/L (ref 101–111)
Glucose, Bld: 100 mg/dL — ABNORMAL HIGH (ref 65–99)
Potassium: 4.1 mmol/L (ref 3.5–5.1)
Sodium: 137 mmol/L (ref 135–145)
TOTAL PROTEIN: 7.3 g/dL (ref 6.5–8.1)

## 2017-06-22 NOTE — Progress Notes (Signed)
Patient here today as new evaluation regarding skull lesions.  Referred by Dr. Vidal Schwalbe.  Patient states she had a headache for 4 weeks and went for CT scan which showed the lesions.  Patient has hx of breast cancer (left).  Patient states she had radiation only.  She did tamoxifen for 5 years.

## 2017-06-22 NOTE — Progress Notes (Signed)
[facility] day:  06/22/2017  Chief Complaint: Kelly Mata is a 67 y.o. female with a history of left breast cancer and lytic skull lesions who is referred in consultation by Mable Paris, FNP for assessment and management.  HPI:  The patient has a history of left breast cancer in 2009.  She underwent lumpectomy on 07/09/2007 by Dr. Jamal Collin.  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  DCIS was at least 0.5 cm measured on the core biopsy slide.  There was central comedonecrosis.  There was a focal positive margin (caudal close to lateral end).  Pathologic stage was pTisNx.  She received radiation followed by 5 years of tamoxifen.  Bilateral screening mammogram on 02/07/2017 revealed no evidence of malignancy.  She recently presented with headache and neck stiffness x 18 days.  Head CT without contrast on 06/14/2017 revealed no acute intracranial abnormalities. There were 2 small ill-defined lytic foci identified within LEFT parietal bone, larger lesion 10 mm diameter.  These were nonspecific in appearance and of uncertain acuity due to lack of prior exams.  Cervical spine CT on 06/14/2017 revealed degenerative disc disease changes at C5-C6 and C6-C7.  There were no acute cervical spine abnormalities.  The patient notes persistent headaches since December. She was initially treated with prednisone and valacyclovir.  Her headaches "move around". She notes pain is in her RIGHT temple today. Patient has been fatigued. She notes a decreased appetite over the course of the last 3 weeks. Patient has lost 5 pounds during this period of time.  Patient normally has constipation, however as of late, her bowels have been loose. Patient denies bleeding; no hematochezia, melena, or gross hematuria.   She is s/p hysterectomy for fibroids in 1998.  She does not smoke.  Colonoscopy was negative in 2013  (Dr. Jamal Collin).  She has a family history of breast cancer (maternal cousin age 21), pancreatic cancer (father), esophageal cancer (mother).   Past Medical History:  Diagnosis Date  . Allergy   . Breast cancer ([place]) 2009   neg  . Cancer [facility]) 2009   left breast DCIS, 5 yrs tamoxifen  . Heart murmur 2008  . Squamous cell carcinoma     Past Surgical History:  Procedure Laterality Date  . ABDOMINAL HYSTERECTOMY  1998   complete   . BREAST BIOPSY  2008  . BREAST BIOPSY Left 2009   stereo breast biopsy  . BREAST BIOPSY  1998  . BREAST LUMPECTOMY Left 2009   radiation  . COLONOSCOPY  2013  . TONSILLECTOMY  1957    Family History  Problem Relation Age of Onset  . Cancer Other        cousin, breast  . Cancer Father        pancreatic  . Breast cancer Cousin 32  . Cancer Mother   . Cancer Maternal Aunt     Social History:  reports that she has never smoked. She has never used smokeless tobacco. She reports that she drinks alcohol. She reports that she does not use drugs.  She occasionally drinks alcohol. Patient is a retired Research scientist (medical).  Patient denies known exposures to radiation on toxins. The patient is alone today.  Allergies:  Allergies  Allergen Reactions  . Ceclor [Cefaclor] Hives  . Ampicillin Rash    Current Medications: Current Outpatient Medications  Medication Sig Dispense Refill  . ibuprofen (ADVIL,MOTRIN) 200 MG tablet Take  200 mg by mouth every 6 (six) hours as needed.     No current facility-administered medications for this visit.     Review of Systems:  GENERAL:  Feels really tired.  Weak x 2-3 weeks.  No fevers or sweats. 5 pound recent weight loss. PERFORMANCE STATUS (ECOG):  1 HEENT:  No visual changes, runny nose, sore throat, mouth sores or tenderness. Lungs: No shortness of breath or cough.  No hemoptysis. Cardiac:  No chest pain, palpitations, orthopnea, or PND. GI:  Chronic constipation, with recent change to loose. No nausea,  vomiting, diarrhea,  melena or hematochezia. GU:  No urgency, frequency, dysuria, or hematuria. Musculoskeletal: Arthritic pain in knees. No back pain.   No muscle tenderness. Extremities:  Hands cramped on 06/17/2017 ("claw like").  No pain or swelling. Skin:  Multiple SCC lesions removed. No rashes or skin changes. Neuro:  Non-specific headache (chronic).  No numbness or weakness, balance or coordination issues. Endocrine:  No diabetes, thyroid issues, hot flashes or night sweats. Psych:  No mood changes, depression or anxiety. Pain:  No focal pain. Review of systems:  All other systems reviewed and found to be negative.  Physical Exam: Blood pressure 130/88, pulse 77, temperature (!) 97.4 F (36.3 C), temperature source Tympanic, resp. rate 18, height 5\' 1"  (1.549 m), weight 156 lb 4 oz (70.9 kg). GENERAL:  Well developed, well nourished, woman sitting comfortably in the exam room in no acute distress. MENTAL STATUS:  Alert and oriented to person, place and time. HEAD:  Short brown hair.  Normocephalic, atraumatic, face symmetric, no Cushingoid features.  No scalp lesions. EYES:  Blue eyes.  Pupils equal round and reactive to light and accomodation.  No conjunctivitis or scleral icterus. ENT:  Oropharynx clear without lesion.  Tongue normal. Mucous membranes moist.  RESPIRATORY:  Clear to auscultation without rales, wheezes or rhonchi. CARDIOVASCULAR:  Regular rate and rhythm without murmur, rub or gallop. ABDOMEN:  Soft, non-tender, with active bowel sounds, and no hepatosplenomegaly.  No masses. SKIN:  No rashes, ulcers or lesions. EXTREMITIES: No edema, no skin discoloration or tenderness.  No palpable cords. LYMPH NODES: No palpable cervical, supraclavicular, axillary or inguinal adenopathy  NEUROLOGICAL: Unremarkable. PSYCH:  Appropriate.   No visits with results within 3 Day(s) from this visit.  Latest known visit with results is:  Office Visit on 06/01/2017  Component Date  Value Ref Range Status  . Varicella Zoster Ab IgM 06/01/2017 <=0.90  <=0.90 Final   Comment: .  < or = 0.90 Negative  0.91 - 1.09 Equivocal  > or = 1.10 Positive . Marland Kitchen Results from any one IgM assay should not be used as a sole determinant of a current or recent infection. Because an IgM test can yield false positive results and low levels of IgM antibody may persist for more than 12 months post infection, reliance on a single test result could be misleading. If an acute infection is suspected, consider obtaining a new specimen and submit for both IgG and IgM testing in two or more weeks. .   . Varicella IgG 06/01/2017 1,416.00  index Final   Comment:        Index               Interpretation      ---------         ----------------------     <135.00            Negative - Antibody not detected  135.00 - 164.99    Equivocal     > or = 165.00      Positive - Antibody detected .     A positive result indicates that the patient     has antibody to VZV but does not differentiate     between an active or past infection.      The clinical diagnosis must be interpreted in      conjunction with the clinical signs and symptoms of      the patient. This assay reliably measures immunity     due to previous infection but may not be      sensitive enough to detect antibodies induced by     vaccination. Thus, a negative result in a vaccinated     individual does not necessarily indicate     susceptibility to VZV infection.   . Sed Rate 06/01/2017 6  0 - 30 mm/hr Final    Assessment:  Kelly Mata is a 67 y.o. female with ill defined lytic foci in the left parietal bone.  She presented with fatigue, headache, and weight loss.  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  DCIS was at least 0.5 cm measured on the core biopsy slide.  There was central  comedonecrosis.  There was a focal positive margin (caudal close to lateral end).  Pathologic stage was pTisNx.  She received radiation followed by 5 years of tamoxifen.  Bilateral screening mammogram on 02/07/2017 revealed no evidence of malignancy.  She recently presented with headache and neck stiffness.  Head CT without contrast on 06/14/2017 revealed no acute intracranial abnormalities. There were 2 small ill-defined lytic foci identified within LEFT parietal bone (larger lesion 10 mm).  These were nonspecific in appearance and of uncertain acuity due to lack of prior exams.  Cervical spine CT on 06/14/2017 revealed degenerative disc disease changes at C5-C6 and C6-C7.  There were no acute cervical spine abnormalities.  She is s/p hysterectomy for fibroids in 1998.  She does not smoke.  Colonoscopy was negative in 2013.  Symptomatically, she notes a headache since 02/2017 and a 3 week history of fatigue and a 5 pound weight loss.  Exam reveals no adenopathy or hepatosplenomegaly.  Plan: 1.  Discuss prior diagnosis of DCIS and management.  She had stage 0 breast cancer.  Doubt progressive disease.  Mammogram in 01/2017 was unremarkable.  2.  Discuss unclear significance of head CT lesions.  Discuss further imaging (bone scan and plain films).  If lesion worrisome on follow-up imaging, will proceed with work-up for myeloma or metastatic disease. 3.  Schedule bone scan 4.  Schedule plain films of lateral calvarium. 5.  Labs today:  CBC with diff, CMP, CA27.29. 6.  RTC after imaging studies for MD assessment and discussion regarding direction of therapy.   Honor Loh, NP  06/22/2017, 9:33 AM   I saw and evaluated the patient, participating in the key portions of the service and reviewing pertinent diagnostic studies and records.  I reviewed the nurse practitioner's note and agree with the findings and the plan.  The assessment and plan were discussed with the patient.  Multiple questions were  asked by the patient and answered.   Nolon Stalls, MD 06/22/2017, 9:33 AM

## 2017-06-23 LAB — CANCER ANTIGEN 27.29: CAN 27.29: 19.9 U/mL (ref 0.0–38.6)

## 2017-06-25 ENCOUNTER — Encounter: Payer: Self-pay | Admitting: Hematology and Oncology

## 2017-06-29 ENCOUNTER — Other Ambulatory Visit: Payer: Self-pay | Admitting: Hematology and Oncology

## 2017-06-29 ENCOUNTER — Ambulatory Visit
Admission: RE | Admit: 2017-06-29 | Discharge: 2017-06-29 | Disposition: A | Discharge status: Home / Self Care | Payer: PPO | Source: Ambulatory Visit | Attending: Hematology and Oncology | Admitting: Hematology and Oncology

## 2017-06-29 ENCOUNTER — Other Ambulatory Visit: Payer: PPO

## 2017-06-29 ENCOUNTER — Encounter: Admission: RE | Admit: 2017-06-29 | Payer: PPO | Source: Ambulatory Visit

## 2017-06-29 DIAGNOSIS — C50919 Malignant neoplasm of unspecified site of unspecified female breast: Secondary | ICD-10-CM

## 2017-06-29 DIAGNOSIS — R937 Abnormal findings on diagnostic imaging of other parts of musculoskeletal system: Secondary | ICD-10-CM | POA: Diagnosis not present

## 2017-06-29 DIAGNOSIS — M898X9 Other specified disorders of bone, unspecified site: Secondary | ICD-10-CM | POA: Diagnosis not present

## 2017-06-29 DIAGNOSIS — M8580 Other specified disorders of bone density and structure, unspecified site: Secondary | ICD-10-CM | POA: Diagnosis not present

## 2017-06-29 MED ORDER — TECHNETIUM TC 99M MEDRONATE IV KIT
20.0000 | PACK | Freq: Once | INTRAVENOUS | Status: AC | PRN
  Administered 2017-06-29: 23.031 via INTRAVENOUS

## 2017-07-04 ENCOUNTER — Other Ambulatory Visit: Payer: Self-pay

## 2017-07-04 ENCOUNTER — Encounter: Payer: Self-pay | Admitting: Hematology and Oncology

## 2017-07-04 ENCOUNTER — Inpatient Hospital Stay: Payer: PPO

## 2017-07-04 ENCOUNTER — Inpatient Hospital Stay (HOSPITAL_BASED_OUTPATIENT_CLINIC_OR_DEPARTMENT_OTHER): Payer: PPO | Admitting: Hematology and Oncology

## 2017-07-04 DIAGNOSIS — Z923 Personal history of irradiation: Secondary | ICD-10-CM

## 2017-07-04 DIAGNOSIS — Z86000 Personal history of in-situ neoplasm of breast: Secondary | ICD-10-CM | POA: Diagnosis not present

## 2017-07-04 DIAGNOSIS — Z809 Family history of malignant neoplasm, unspecified: Secondary | ICD-10-CM

## 2017-07-04 DIAGNOSIS — R5383 Other fatigue: Secondary | ICD-10-CM

## 2017-07-04 DIAGNOSIS — M50322 Other cervical disc degeneration at C5-C6 level: Secondary | ICD-10-CM

## 2017-07-04 DIAGNOSIS — R011 Cardiac murmur, unspecified: Secondary | ICD-10-CM | POA: Diagnosis not present

## 2017-07-04 DIAGNOSIS — Z803 Family history of malignant neoplasm of breast: Secondary | ICD-10-CM

## 2017-07-04 DIAGNOSIS — G939 Disorder of brain, unspecified: Secondary | ICD-10-CM | POA: Diagnosis not present

## 2017-07-04 DIAGNOSIS — Z85828 Personal history of other malignant neoplasm of skin: Secondary | ICD-10-CM

## 2017-07-04 DIAGNOSIS — M899 Disorder of bone, unspecified: Secondary | ICD-10-CM | POA: Insufficient documentation

## 2017-07-04 DIAGNOSIS — R21 Rash and other nonspecific skin eruption: Secondary | ICD-10-CM

## 2017-07-04 DIAGNOSIS — Z800 Family history of malignant neoplasm of digestive organs: Secondary | ICD-10-CM

## 2017-07-04 NOTE — Progress Notes (Signed)
[facility] day:  07/04/2017  Chief Complaint: Kelly Mata is a 67 y.o. female with a history of left breast cancer and lytic skull lesions who is seen for review of work-up and discussion regarding direction of therapy.  HPI:  The patient was last seen in the medical oncology clinic on 06/22/2017 for initial consultation.  She was noted to have a history of breast cancer (2009).  She had small ill defined lytic skull lesions noted on head CT of unclear significance.  She described a headache since 02/2017 and a 3 week history of fatigue and a 5 pound weight loss.   Labs included a normal CBC with diff, CMP, and CA27.29.  Creatinine was 0.90.  Protein was 7.3 and albumen 4.2.  Alkaline phosphatase was 71.  Skull films on 06/22/2017 revealed multiple radiolucent lesions within the posterior parietal-occipital region. These were worrisome for either foci of multiple myeloma or metastases.  Bone scan on 06/29/2017 revealed scattered degenerative type uptake.  There was uptake on RIGHT at L4-L5, favor degenerative, consider AP/lateral radiographs of lumbar spine to assess.  There was no definite scintigraphic evidence of osseous metastatic disease otherwise identified.  Symptomatically, she is feeling "ok". Patient notes that she has not been experiencing "as many" headaches as of late. She has had no focal neurological symptoms; no visual changes or extremity weakness. She denies B symptoms and recent infections.   Patient is eating well. Her weight has increased by 1 pound. Patient denies pain in the clinic today.    Past Medical History:  Diagnosis Date  . Allergy   . Breast cancer ([place]) 2009   neg  . Cancer [facility]) 2009   left breast DCIS, 5 yrs tamoxifen  . Heart murmur 2008  . Squamous cell carcinoma     Past Surgical History:  Procedure Laterality Date  . ABDOMINAL HYSTERECTOMY  1998   complete   . BREAST BIOPSY  2008  . BREAST  BIOPSY Left 2009   stereo breast biopsy  . BREAST BIOPSY  1998  . BREAST LUMPECTOMY Left 2009   radiation  . COLONOSCOPY  2013  . TONSILLECTOMY  1957    Family History  Problem Relation Age of Onset  . Cancer Other        cousin, breast  . Cancer Father        pancreatic  . Breast cancer Cousin 61  . Cancer Mother   . Cancer Maternal Aunt     Social History:  reports that she has never smoked. She has never used smokeless tobacco. She reports that she drinks alcohol. She reports that she does not use drugs.  She occasionally drinks alcohol. Patient is a retired Research scientist (medical).  Patient denies known exposures to radiation on toxins. The patient is accompanied by her husband today.  Allergies:  Allergies  Allergen Reactions  . Ceclor [Cefaclor] Hives  . Ampicillin Rash    Current Medications: Current Outpatient Medications  Medication Sig Dispense Refill  . ibuprofen (ADVIL,MOTRIN) 200 MG tablet Take 200 mg by mouth every 6 (six) hours as needed.     No current facility-administered medications for this visit.     Review of Systems:  GENERAL:  Feels "ok".  No fevers, sweats or weight loss.  Weight up 1 pound. PERFORMANCE STATUS (ECOG):  1 HEENT:  No visual changes, runny nose, sore throat, mouth sores or tenderness. Lungs: No shortness of breath or cough.  No hemoptysis. Cardiac:  No chest pain, palpitations, orthopnea, or PND. GI:  Eating well.  Chronic constipation.  No nausea, vomiting, diarrhea, melena or hematochezia. GU:  No urgency, frequency, dysuria, or hematuria. Musculoskeletal:  No back pain.  Arthritis in knees.  No muscle tenderness. Extremities:  No pain or swelling. Skin:  No rashes or skin changes. Neuro:  Headaches, improved.  No numbness or weakness, balance or coordination issues. Endocrine:  No diabetes, thyroid issues, hot flashes or night sweats. Psych:  No mood changes, depression or anxiety. Pain:  No focal pain. Review of systems:  All  other systems reviewed and found to be negative.   Physical Exam: Blood pressure (!) 188/83, pulse 77, temperature 98.3 F (36.8 C), temperature source Oral, resp. rate 18, weight 157 lb 12.8 oz (71.6 kg). GENERAL:  Well developed, well nourished, woman sitting comfortably in the exam room in no acute distress. MENTAL STATUS:  Alert and oriented to person, place and time. HEAD:  Short brown hair.  Normocephalic, atraumatic, face symmetric, no Cushingoid features. EYES:  Blue eyes. No conjunctivitis or scleral icterus.  NEUROLOGICAL: Unremarkable. PSYCH:  Appropriate.    No visits with results within 3 Day(s) from this visit.  Latest known visit with results is:  Appointment on 06/22/2017  Component Date Value Ref Range Status  . CA 27.29 06/22/2017 19.9  0.0 - 38.6 U/mL Final   Comment: (NOTE) Siemens Centaur Immunochemiluminometric Methodology [facility]) Values obtained with different assay methods or kits cannot be used interchangeably. Results cannot be interpreted as absolute evidence of the presence or absence of malignant disease. Performed At: [facility] [place], [place] [id number] Rush Farmer MD [id number] Performed at [facility], [address], [place], [place] [address]   . Sodium 06/22/2017 137  135 - 145 mmol/L Final  . Potassium 06/22/2017 4.1  3.5 - 5.1 mmol/L Final  . Chloride 06/22/2017 105  101 - 111 mmol/L Final  . CO2 06/22/2017 23  22 - 32 mmol/L Final  . Glucose, Bld 06/22/2017 100* 65 - 99 mg/dL Final  . BUN 06/22/2017 16  6 - 20 mg/dL Final  . Creatinine, Ser 06/22/2017 0.90  0.44 - 1.00 mg/dL Final  . Calcium 06/22/2017 9.5  8.9 - 10.3 mg/dL Final  . Total Protein 06/22/2017 7.3  6.5 - 8.1 g/dL Final  . Albumin 06/22/2017 4.2  3.5 - 5.0 g/dL Final  . AST 06/22/2017 15  15 - 41 U/L Final  . ALT 06/22/2017 13* 14 - 54 U/L Final  . Alkaline Phosphatase 06/22/2017 71  38 - 126 U/L Final  . Total Bilirubin 06/22/2017  0.6  0.3 - 1.2 mg/dL Final  . GFR calc non Af Amer 06/22/2017 >60  >60 mL/min Final  . GFR calc Af Amer 06/22/2017 >60  >60 mL/min Final   Comment: (NOTE) The eGFR has been calculated using the CKD EPI equation. This calculation has not been validated in all clinical situations. eGFR's persistently <60 mL/min signify possible Chronic Kidney Disease.   Georgiann Hahn gap 06/22/2017 9  5 - 15 Final   Performed at [facility], [address] [place], [place] [address]  . WBC 06/22/2017 7.1  3.6 - 11.0 K/uL Final  . RBC 06/22/2017 4.12  3.80 - 5.20 MIL/uL Final  . Hemoglobin 06/22/2017 12.7  12.0 - 16.0 g/dL Final  . HCT 06/22/2017 37.4  35.0 - 47.0 % Final  . MCV 06/22/2017 90.8  80.0 - 100.0 fL Final  . MCH 06/22/2017 30.9  26.0 - 34.0 pg Final  . MCHC 06/22/2017 34.1  32.0 - 36.0 g/dL Final  . RDW 06/22/2017 13.0  11.5 - 14.5 % Final  . Platelets 06/22/2017 249  150 - 440 K/uL Final  . Neutrophils Relative % 06/22/2017 73  % Final  . Neutro Abs 06/22/2017 5.1  1.4 - 6.5 K/uL Final  . Lymphocytes Relative 06/22/2017 18  % Final  . Lymphs Abs 06/22/2017 1.3  1.0 - 3.6 K/uL Final  . Monocytes Relative 06/22/2017 7  % Final  . Monocytes Absolute 06/22/2017 0.5  0.2 - 0.9 K/uL Final  . Eosinophils Relative 06/22/2017 1  % Final  . Eosinophils Absolute 06/22/2017 0.1  0 - 0.7 K/uL Final  . Basophils Relative 06/22/2017 1  % Final  . Basophils Absolute 06/22/2017 0.1  0 - 0.1 K/uL Final   Performed at [facility], [address] [place], [place] [address]    Assessment:  Kelly Mata is a 67 y.o. female with ill defined lytic foci in the left parietal bone.  She presented with fatigue, headache, and weight loss.  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  DCIS was at least 0.5 cm measured on the core biopsy slide.  There was central  comedonecrosis.  There was a focal positive margin (caudal close to lateral end).  Pathologic stage was pTisNx.  She received radiation followed by 5 years of tamoxifen.  Bilateral screening mammogram on 02/07/2017 revealed no evidence of malignancy.  CA27.29 was 19.9 on 06/22/2017.  She recently presented with headache and neck stiffness.  Head CT without contrast on 06/14/2017 revealed no acute intracranial abnormalities. There were 2 small ill-defined lytic foci identified within LEFT parietal bone (larger lesion 10 mm).  These were nonspecific in appearance and of uncertain acuity due to lack of prior exams.  Cervical spine CT on 06/14/2017 revealed degenerative disc disease changes at C5-C6 and C6-C7.  There were no acute cervical spine abnormalities.  Skull films on 06/22/2017 revealed multiple radiolucent lesions within the posterior parietal-occipital region. These were worrisome for either foci of multiple myeloma or metastases.  Bone scan on 06/29/2017 revealed scattered degenerative type uptake.  There was uptake on RIGHT at L4-L5, favor degenerative, consider AP/lateral radiographs of lumbar spine to assess.  There was no definite scintigraphic evidence of osseous metastatic disease otherwise identified.  She is s/p hysterectomy for fibroids in 1998.  She does not smoke.  Colonoscopy was negative in 2013.  Symptomatically, headaches have improved.  She has gained weight.  Exam is stable.  Plan: 1.  Labs today: myeloma panel, 24 hour urine for UPEP and free light chains, FLCA. 2.  Review laboratory work-up- negative. 3.  Review plain films of skull and bone scan.  Images personally reviewed.  Skull films shown to patient.  Multiple lytic lesions worrisome for myeloma versus metastatic disease.  Differential also includes benign etiology.  Discuss pursuing PET scan. 4.  Schedule PET scan to assess for myeloma versus metastatic disease. 5.  RTC after PET scan for MD assessment to review  results.     Honor Loh, NP  07/04/2017, 11:12 AM   I saw and evaluated the patient, participating in the key portions of the service and reviewing pertinent diagnostic studies and records.  I reviewed the nurse practitioner's note and agree with the findings and the plan.  Additional  diagnostic study of a PET scan needed to clarify skull lesions and would change the clinical management.  The assessment and plan were discussed with the patient.  Several questions were asked by the patient and answered.    Nolon Stalls, MD 07/04/2017, 11:12 AM

## 2017-07-04 NOTE — Progress Notes (Signed)
Patient here for follow up. Continues " having headaches every now and then."

## 2017-07-05 DIAGNOSIS — Z86000 Personal history of in-situ neoplasm of breast: Secondary | ICD-10-CM | POA: Diagnosis not present

## 2017-07-05 LAB — KAPPA/LAMBDA LIGHT CHAINS
KAPPA FREE LGHT CHN: 10.3 mg/L (ref 3.3–19.4)
Kappa, lambda light chain ratio: 0.75 (ref 0.26–1.65)
Lambda free light chains: 13.8 mg/L (ref 5.7–26.3)

## 2017-07-06 ENCOUNTER — Other Ambulatory Visit: Payer: Self-pay | Admitting: *Deleted

## 2017-07-06 DIAGNOSIS — M899 Disorder of bone, unspecified: Secondary | ICD-10-CM

## 2017-07-06 LAB — MULTIPLE MYELOMA PANEL, SERUM
ALBUMIN/GLOB SERPL: 1.5 (ref 0.7–1.7)
ALPHA 1: 0.2 g/dL (ref 0.0–0.4)
ALPHA2 GLOB SERPL ELPH-MCNC: 0.8 g/dL (ref 0.4–1.0)
Albumin SerPl Elph-Mcnc: 3.8 g/dL (ref 2.9–4.4)
B-Globulin SerPl Elph-Mcnc: 1 g/dL (ref 0.7–1.3)
GAMMA GLOB SERPL ELPH-MCNC: 0.7 g/dL (ref 0.4–1.8)
GLOBULIN, TOTAL: 2.7 g/dL (ref 2.2–3.9)
IGG (IMMUNOGLOBIN G), SERUM: 728 mg/dL (ref 700–1600)
IgA: 81 mg/dL — ABNORMAL LOW (ref 87–352)
IgM (Immunoglobulin M), Srm: 113 mg/dL (ref 26–217)
Total Protein ELP: 6.5 g/dL (ref 6.0–8.5)

## 2017-07-07 ENCOUNTER — Ambulatory Visit
Admission: RE | Admit: 2017-07-07 | Discharge: 2017-07-07 | Disposition: A | Discharge status: Home / Self Care | Payer: PPO | Source: Ambulatory Visit | Attending: Urgent Care | Admitting: Urgent Care

## 2017-07-07 DIAGNOSIS — J8410 Pulmonary fibrosis, unspecified: Secondary | ICD-10-CM | POA: Insufficient documentation

## 2017-07-07 DIAGNOSIS — D1771 Benign lipomatous neoplasm of kidney: Secondary | ICD-10-CM | POA: Insufficient documentation

## 2017-07-07 DIAGNOSIS — D71 Functional disorders of polymorphonuclear neutrophils: Secondary | ICD-10-CM | POA: Insufficient documentation

## 2017-07-07 DIAGNOSIS — M899 Disorder of bone, unspecified: Secondary | ICD-10-CM

## 2017-07-07 DIAGNOSIS — G939 Disorder of brain, unspecified: Secondary | ICD-10-CM | POA: Diagnosis not present

## 2017-07-07 LAB — GLUCOSE, CAPILLARY: GLUCOSE-CAPILLARY: 81 mg/dL (ref 65–99)

## 2017-07-07 MED ORDER — FLUDEOXYGLUCOSE F - 18 (FDG) INJECTION
8.3800 | Freq: Once | INTRAVENOUS | Status: AC | PRN
  Administered 2017-07-07: 8.38 via INTRAVENOUS

## 2017-07-10 LAB — IFE+PROTEIN ELECTRO, 24-HR UR
% BETA, URINE: 0 %
ALBUMIN, U: 100 %
ALPHA 1 URINE: 0 %
Alpha 2, Urine: 0 %
GAMMA GLOBULIN URINE: 0 %
TOTAL PROTEIN, URINE-UPE24: 6.4 mg/dL
Total Protein, Urine-Ur/day: 83 mg/24 hr (ref 30–150)
Total Volume: 1300

## 2017-07-11 ENCOUNTER — Encounter: Payer: Self-pay | Admitting: Hematology and Oncology

## 2017-07-11 ENCOUNTER — Inpatient Hospital Stay (HOSPITAL_BASED_OUTPATIENT_CLINIC_OR_DEPARTMENT_OTHER): Payer: PPO | Admitting: Hematology and Oncology

## 2017-07-11 VITALS — BP 154/93 | HR 76 | Temp 98.3°F | Resp 8 | Wt 157.3 lb

## 2017-07-11 DIAGNOSIS — Z809 Family history of malignant neoplasm, unspecified: Secondary | ICD-10-CM

## 2017-07-11 DIAGNOSIS — G939 Disorder of brain, unspecified: Secondary | ICD-10-CM | POA: Diagnosis not present

## 2017-07-11 DIAGNOSIS — M50322 Other cervical disc degeneration at C5-C6 level: Secondary | ICD-10-CM | POA: Diagnosis not present

## 2017-07-11 DIAGNOSIS — D1771 Benign lipomatous neoplasm of kidney: Secondary | ICD-10-CM

## 2017-07-11 DIAGNOSIS — Z86000 Personal history of in-situ neoplasm of breast: Secondary | ICD-10-CM | POA: Diagnosis not present

## 2017-07-11 DIAGNOSIS — R011 Cardiac murmur, unspecified: Secondary | ICD-10-CM | POA: Diagnosis not present

## 2017-07-11 DIAGNOSIS — M899 Disorder of bone, unspecified: Secondary | ICD-10-CM

## 2017-07-11 DIAGNOSIS — Z923 Personal history of irradiation: Secondary | ICD-10-CM

## 2017-07-11 DIAGNOSIS — Z85828 Personal history of other malignant neoplasm of skin: Secondary | ICD-10-CM

## 2017-07-11 DIAGNOSIS — Z800 Family history of malignant neoplasm of digestive organs: Secondary | ICD-10-CM | POA: Diagnosis not present

## 2017-07-11 DIAGNOSIS — R5383 Other fatigue: Secondary | ICD-10-CM | POA: Diagnosis not present

## 2017-07-11 DIAGNOSIS — R21 Rash and other nonspecific skin eruption: Secondary | ICD-10-CM | POA: Diagnosis not present

## 2017-07-11 DIAGNOSIS — Z803 Family history of malignant neoplasm of breast: Secondary | ICD-10-CM | POA: Diagnosis not present

## 2017-07-11 NOTE — Progress Notes (Signed)
Patient here today for PET results. 

## 2017-07-11 NOTE — Progress Notes (Signed)
Kelly Mata day:  07/11/2017  Chief Complaint: Kelly Mata is a 67 y.o. 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  HPI:  The patient was last seen in the medical oncology Mata on 07/04/2017.  At that time, headaches had improved.  She had gained weight.  Exam was stable.  Additional labs included a normal SPEP and free light cain assay.  24 hour UPEP revealed no monoclonal protein or free light chains.  PET scan on 07/07/2017 revealed no hypermetabolic lesions to suggest metastatic disease or multiple myeloma. The tiny lucent lesions in the left parietal bone were stable, likely benign and incidental. There was evidence of prior granulomatous disease with calcified left lower lobe granuloma and left hilar lymph nodes.  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  Symptomatically, she denies any complaints.  She is anxious today.  She comments that she "had something in her lungs before".   Past Medical History:  Diagnosis Date  . Allergy   . Breast cancer ([place]) 2009   neg  . Cancer [facility]) 2009   left breast DCIS, 5 yrs tamoxifen  . Heart murmur 2008  . Squamous cell carcinoma     Past Surgical History:  Procedure Laterality Date  . ABDOMINAL HYSTERECTOMY  1998   complete   . BREAST BIOPSY  2008  . BREAST BIOPSY Left 2009   stereo breast biopsy  . BREAST BIOPSY  1998  . BREAST LUMPECTOMY Left 2009   radiation  . COLONOSCOPY  2013  . TONSILLECTOMY  1957    Family History  Problem Relation Age of Onset  . Cancer Other        cousin, breast  . Cancer Father        pancreatic  . Breast cancer Cousin 10  . Cancer Mother   . Cancer Maternal Aunt     Social History:  reports that she has never smoked. She has never used smokeless tobacco. She reports that she drinks alcohol. She reports that she does  not use drugs.  She occasionally drinks alcohol. Patient is a retired Research scientist (medical).  Patient denies known exposures to radiation on toxins. The patient is accompanied by her husband today.  Allergies:  Allergies  Allergen Reactions  . Ceclor [Cefaclor] Hives  . Ampicillin Rash    Current Medications: Current Outpatient Medications  Medication Sig Dispense Refill  . ibuprofen (ADVIL,MOTRIN) 200 MG tablet Take 200 mg by mouth every 6 (six) hours as needed.     No current facility-administered medications for this visit.     Review of Systems:  GENERAL:  Feels good. No fevers, sweats or weight loss. PERFORMANCE STATUS (ECOG):  0 HEENT:  No visual changes, runny nose, sore throat, mouth sores or tenderness. Lungs: No shortness of breath or cough.  No hemoptysis. Cardiac:  No chest pain, palpitations, orthopnea, or PND. GI:  Eating well.  No nausea, vomiting, diarrhea, constipation, melena or hematochezia. GU:  No urgency, frequency, dysuria, or hematuria. Musculoskeletal:  No back pain.  No joint pain.  No muscle tenderness. Extremities:  No pain or swelling. Skin:  No rashes or skin changes. Neuro:  Headaches, improved.  No numbness or weakness, balance or coordination issues. Endocrine:  No diabetes, thyroid issues, hot flashes or night sweats. Psych:  No mood changes, depression or anxiety. Pain:  No focal pain. Review of systems:  All other systems reviewed and found to be negative.   Physical Exam: Blood pressure (!) 154/93, pulse 76, temperature 98.3 F (36.8 C), temperature source Tympanic, resp. rate (!) 8, weight 157 lb 5 oz (71.4 kg). GENERAL:  Well developed, well nourished, woman sitting comfortably in the exam room in no acute distress. MENTAL STATUS:  Alert and oriented to person, place and time. HEAD:  Short brown hair.  Normocephalic, atraumatic, face symmetric, no Cushingoid features. EYES:  Blue eyes.  No conjunctivitis or scleral icterus. NEUROLOGICAL:  Unremarkable. PSYCH:  Appropriate.    No visits with results within 3 Day(s) from this visit.  Latest known visit with results is:  Hospital Outpatient Visit on 07/07/2017  Component Date Value Ref Range Status  . Glucose-Capillary 07/07/2017 81  65 - 99 mg/dL Final    Assessment:  Kelly Mata is a 67 y.o. female with ill defined lytic foci in the left parietal bone.  She presented with fatigue, headache, and weight loss.  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  DCIS was at least 0.5 cm measured on the core biopsy slide.  There was central comedonecrosis.  There was a focal positive margin (caudal close to lateral end).  Pathologic stage was pTisNx.  She received radiation followed by 5 years of tamoxifen.  Bilateral screening mammogram on 02/07/2017 revealed no evidence of malignancy.  CA27.29 was 19.9 on 06/22/2017.  She recently presented with headache and neck stiffness.  Head CT without contrast on 06/14/2017 revealed no acute intracranial abnormalities. There were 2 small ill-defined lytic foci identified within LEFT parietal bone (larger lesion 10 mm).  These were nonspecific in appearance and of uncertain acuity due to lack of prior exams.  Cervical spine CT on 06/14/2017 revealed degenerative disc disease changes at C5-C6 and C6-C7.  There were no acute cervical spine abnormalities.  Skull films on 06/22/2017 revealed multiple radiolucent lesions within the posterior parietal-occipital region. These were worrisome for either foci of multiple myeloma or metastases.  Work-up on 07/04/2017 revealed the following normal labs:  myeloma panel, free light chain assay, and 24 hour UPEP.  Bone scan on 06/29/2017 revealed scattered degenerative type uptake.  There was uptake on RIGHT at L4-L5, favor degenerative, consider AP/lateral radiographs of lumbar  spine to assess.  There was no definite scintigraphic evidence of osseous metastatic disease otherwise identified.  PET scan on 07/07/2017 revealed no hypermetabolic lesions to suggest metastatic disease or multiple myeloma. The tiny lucent lesions in the left parietal bone were stable, likely benign and incidental. There was evidence of prior granulomatous disease with calcified left lower lobe granuloma and left hilar lymph nodes.  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  She is s/p hysterectomy for fibroids in 1998.  She does not smoke.  Colonoscopy was negative in 2013.  Symptomatically, headaches have improved.  Exam is unremarkable.  Plan: 1.  Review laboratory work-up:  No evidence of multiple myeloma. 2.  Images personally reviewed.  Review PET scan- no evidence of multiple myeloma or metastatic disease.  Per radiology, likely benign and incidental.  Reassurance provided. 3.  Discuss old granulomatous disease.  Patient notes history of  "something in her lungs before".  Patient also has a small (10 mm) angiomyolipoma in her left kidney.  Will require surveillance. 4.  RTC prn.  Lequita Asal, MD  07/11/2017, 4:35 PM

## 2017-07-15 DIAGNOSIS — D1771 Benign lipomatous neoplasm of kidney: Secondary | ICD-10-CM | POA: Insufficient documentation

## 2017-08-16 ENCOUNTER — Ambulatory Visit (INDEPENDENT_AMBULATORY_CARE_PROVIDER_SITE_OTHER): Payer: PPO | Admitting: Neurology

## 2017-08-16 ENCOUNTER — Encounter: Payer: Self-pay | Admitting: Neurology

## 2017-08-16 ENCOUNTER — Encounter

## 2017-08-16 VITALS — BP 129/74 | HR 70 | Ht 61.0 in | Wt 159.0 lb

## 2017-08-16 DIAGNOSIS — R51 Headache: Secondary | ICD-10-CM | POA: Diagnosis not present

## 2017-08-16 DIAGNOSIS — R519 Headache, unspecified: Secondary | ICD-10-CM | POA: Insufficient documentation

## 2017-08-16 NOTE — Progress Notes (Signed)
PATIENT: Kelly Mata DOB: 1950/04/20  Chief Complaint  Patient presents with  . Headache    Reports headache that lasted for one week in December 2018 that was thought to be shingles with no rash.  She was treated with prednisone and it resolved.  She developed another headache, in March 2019,  that lasted three weeks.  She was given prednisone again, which did not help.  She then had an abnormal testing showing skull lesions.  She went through a work-up for multiple myeloma but was negative for the diagnosis.  She has history of breast cancer.  Marland Kitchen PCP    Arnett, Yvetta Coder, FNP     HISTORICAL  Kelly Mata is a 67 years old female, seen in refer by her primary care nurse practitioner Burnard Hawthorne for evaluation of headache, initial evaluation was on August 16, 2017.  She had a history of breast cancer, status post lobectomy followed by radiation therapy, was otherwise healthy, deny a previous history of headaches,  In December 2018, she had a week history of almost daily headache, went away, had recurrent headache again in March 2019, this time lasted for 3 weeks, she also noticed numbness tingling shooting pain at her right face, pain at the right retro-orbital area, radiating to the right cervical region, difficult to function doing well 3 weeks,  CT head in April 2019 demonstrated multiple small skull lucency, had more extensive evaluation that was negative for multiple myeloma, her headache now has stopped, she denies lateralized motor or sensory deficit,  PET scan on July 07, 2017: Tiny lucent lesions in the left parietal bone, stable, likely benign incidental, no hypermetabolic lesion  REVIEW OF SYSTEMS: Full 14 system review of systems performed and notable only for snoring, headaches, joint pain, incontinence, murmur, ringing the ears  ALLERGIES: Allergies  Allergen Reactions  . Ceclor [Cefaclor] Hives  . Ampicillin Rash    HOME MEDICATIONS: Current  Outpatient Medications  Medication Sig Dispense Refill  . ibuprofen (ADVIL,MOTRIN) 200 MG tablet Take 200 mg by mouth every 6 (six) hours as needed.     No current facility-administered medications for this visit.     PAST MEDICAL HISTORY: Past Medical History:  Diagnosis Date  . Allergy   . Breast cancer ([place]) 2009   neg  . Cancer [facility]) 2009   left breast DCIS, 5 yrs tamoxifen  . Headache   . Heart murmur 2008  . Squamous cell carcinoma     PAST SURGICAL HISTORY: Past Surgical History:  Procedure Laterality Date  . ABDOMINAL HYSTERECTOMY  1998   complete   . BREAST BIOPSY  2008  . BREAST BIOPSY Left 2009   stereo breast biopsy  . BREAST BIOPSY  1998  . BREAST LUMPECTOMY Left 2009   radiation  . COLONOSCOPY  2013  . TONSILLECTOMY  1957    FAMILY HISTORY: Family History  Problem Relation Age of Onset  . Cancer Other        cousin, breast  . Cancer Father        pancreatic (died at age 4)  . Breast cancer Cousin 51  . Cancer Mother        skin - unsure of type but thinks it was melanoma (died at age 71)  . Cancer Maternal Aunt     SOCIAL HISTORY:  Social History   Socioeconomic History  . Marital status: Married    Spouse name: Not on file  . Number of children: 2  .  Years of education: 72  . Highest education level: Bachelor's degree (e.g., BA, AB, BS)  Occupational History  . Occupation: Retired Corporate treasurer  Social Needs  . Financial resource strain: Not on file  . Food insecurity:    Worry: Not on file    Inability: Not on file  . Transportation needs:    Medical: Not on file    Non-medical: Not on file  Tobacco Use  . Smoking status: Never Smoker  . Smokeless tobacco: Never Used  Substance and Sexual Activity  . Alcohol use: Yes    Comment: 2 glasses of wine  . Drug use: No  . Sexual activity: Not on file  Lifestyle  . Physical activity:    Days per week: Not on file    Minutes per session: Not on file  . Stress: Not on file    Relationships  . Social connections:    Talks on phone: Not on file    Gets together: Not on file    Attends religious service: Not on file    Active member of club or organization: Not on file    Attends meetings of clubs or organizations: Not on file    Relationship status: Not on file  . Intimate partner violence:    Fear of current or ex partner: Not on file    Emotionally abused: Not on file    Physically abused: Not on file    Forced sexual activity: Not on file  Other Topics Concern  . Not on file  Social History Narrative   Corporate treasurer   Retired      Married    2 children      Right-handed.   Caffeine use:  1 cup per day.     PHYSICAL EXAM   Vitals:   08/16/17 0708  BP: 129/74  Pulse: 70  Weight: 159 lb (72.1 kg)  Height: '5\' 1"'  (1.549 m)    Not recorded      Body mass index is 30.04 kg/m.  PHYSICAL EXAMNIATION:  Gen: NAD, conversant, well nourised, obese, well groomed                     Cardiovascular: Regular rate rhythm, no peripheral edema, warm, nontender. Eyes: Conjunctivae clear without exudates or hemorrhage Neck: Supple, no carotid bruits. Pulmonary: Clear to auscultation bilaterally   NEUROLOGICAL EXAM:  MENTAL STATUS: Speech:    Speech is normal; fluent and spontaneous with normal comprehension.  Cognition:     Orientation to time, place and person     Normal recent and remote memory     Normal Attention span and concentration     Normal Language, naming, repeating,spontaneous speech     Fund of knowledge   CRANIAL NERVES: CN II: Visual fields are full to confrontation. Fundoscopic exam is normal with sharp discs and no vascular changes. Pupils are round equal and briskly reactive to light. CN III, IV, VI: extraocular movement are normal. No ptosis. CN V: Facial sensation is intact to pinprick in all 3 divisions bilaterally. Corneal responses are intact.  CN VII: Face is symmetric with normal eye closure and smile. CN VIII:  Hearing is normal to rubbing fingers CN IX, X: Palate elevates symmetrically. Phonation is normal. CN XI: Head turning and shoulder shrug are intact CN XII: Tongue is midline with normal movements and no atrophy.  MOTOR: There is no pronator drift of out-stretched arms. Muscle bulk and tone are normal. Muscle strength is normal.  REFLEXES: Reflexes are 2+ and symmetric at the biceps, triceps, knees, and ankles. Plantar responses are flexor.  SENSORY: Intact to light touch, pinprick, positional sensation and vibratory sensation are intact in fingers and toes.  COORDINATION: Rapid alternating movements and fine finger movements are intact. There is no dysmetria on finger-to-nose and heel-knee-shin.    GAIT/STANCE: Posture is normal. Gait is steady with normal steps, base, arm swing, and turning. Heel and toe walking are normal. Tandem gait is normal.  Romberg is absent.   DIAGNOSTIC DATA (LABS, IMAGING, TESTING) - I reviewed patient records, labs, notes, testing and imaging myself where available.   ASSESSMENT AND PLAN  Kelly Mata is a 67 y.o. female   Transient headaches  With migraine features  No intracranial abnormality, extensive evaluation showed normal negative B12, vitamin D, TSH, A1c, CMP, CBC, ESR no evidence of temporal arteritis  As needed NSAIDs    Marcial Pacas, M.D. Ph.D.  [facility] [address] [place], [place] [address] Ph: [phone] Fax: [phone]  CC: Burnard Hawthorne, FNP

## 2017-08-21 ENCOUNTER — Encounter: Payer: Self-pay | Admitting: Family

## 2017-08-21 ENCOUNTER — Ambulatory Visit (INDEPENDENT_AMBULATORY_CARE_PROVIDER_SITE_OTHER): Payer: PPO | Admitting: Family

## 2017-08-21 VITALS — BP 130/82 | HR 69 | Temp 97.8°F | Resp 16 | Wt 161.0 lb

## 2017-08-21 DIAGNOSIS — Z0000 Encounter for general adult medical examination without abnormal findings: Secondary | ICD-10-CM

## 2017-08-21 DIAGNOSIS — D1771 Benign lipomatous neoplasm of kidney: Secondary | ICD-10-CM

## 2017-08-21 DIAGNOSIS — I700 Atherosclerosis of aorta: Secondary | ICD-10-CM | POA: Diagnosis not present

## 2017-08-21 NOTE — Assessment & Plan Note (Signed)
Discussed unknown clinical significance with patient.  Advised that we should perform renal ultrasound, consult nephrology.  Patient politely declines at this time.  I went ahead and message Dr. Holley Raring, nephrology.  Advised patient to follow-up with me if she has not heard back from me after asking him about surveillance.

## 2017-08-21 NOTE — Assessment & Plan Note (Addendum)
History of hysterectomy.  Deferred pelvic exam and in the abscence of complaints, patient preference.  Clinical breast exam performed today.  In the past, patient to follow Dr. Thurman Coyer for mammograms.  Since retired, advised patient to ensure that she schedule mammogram for Fall this year.  Appropriate screening labs ordered and discussed with patient.

## 2017-08-21 NOTE — Patient Instructions (Addendum)
Fasting labs when you can  As discussed, we will do ultrasound of kidney. I have sent a message to Dr Holley Raring ( nephrology) - please send me a message if you have not heard from me regarding plan/surveillance.   Let me know if headaches recur.    We are due for mammogram 01/2018  this year. I asked that you call one the below locations and schedule this when it is convenient for you.   As discussed, I would like you to ask for 3D mammogram over the traditional 2D mammogram as new evidence suggest 3D is superior.   Please note that NOT all insurance companies cover 3D and you may have to pay a higher copay. You may call your insurance company to further clarify your benefits.   Options for [place]  [place], [place]  * [facility] [place] [place] * Note if you ask for 3D mammogram at this location, you must request [place], [place] location*     Health Maintenance for Postmenopausal Women Menopause is a normal process in which your reproductive ability comes to an end. This process happens gradually over a span of months to years, usually between the ages of 7 and 52. Menopause is complete when you have missed 12 consecutive menstrual periods. It is important to talk with your health care provider about some of the most common conditions that affect postmenopausal women, such as heart disease, cancer, and bone loss (osteoporosis). Adopting a healthy lifestyle and getting preventive care can help to promote your health and wellness. Those actions can also lower your chances of developing some of these common conditions. What should I know about menopause? During menopause, you may experience a number of symptoms, such as:  Moderate-to-severe hot flashes.  Night sweats.  Decrease in sex drive.  Mood  swings.  Headaches.  Tiredness.  Irritability.  Memory problems.  Insomnia.  Choosing to treat or not to treat menopausal changes is an individual decision that you make with your health care provider. What should I know about hormone replacement therapy and supplements? Hormone therapy products are effective for treating symptoms that are associated with menopause, such as hot flashes and night sweats. Hormone replacement carries certain risks, especially as you become older. If you are thinking about using estrogen or estrogen with progestin treatments, discuss the benefits and risks with your health care provider. What should I know about heart disease and stroke? Heart disease, heart attack, and stroke become more likely as you age. This may be due, in part, to the hormonal changes that your body experiences during menopause. These can affect how your body processes dietary fats, triglycerides, and cholesterol. Heart attack and stroke are both medical emergencies. There are many things that you can do to help prevent heart disease and stroke:  Have your blood pressure checked at least every 1-2 years. High blood pressure causes heart disease and increases the risk of stroke.  If you are 38-70 years old, ask your health care provider if you should take aspirin to prevent a heart attack or a stroke.  Do not use any tobacco products, including cigarettes, chewing tobacco, or electronic cigarettes. If you need help quitting, ask your health care provider.  It is important to eat a healthy diet and maintain a healthy weight. ? Be sure to include plenty of vegetables, fruits,  low-fat dairy products, and lean protein. ? Avoid eating foods that are high in solid fats, added sugars, or salt (sodium).  Get regular exercise. This is one of the most important things that you can do for your health. ? Try to exercise for at least 150 minutes each week. The type of exercise that you do should  increase your heart rate and make you sweat. This is known as moderate-intensity exercise. ? Try to do strengthening exercises at least twice each week. Do these in addition to the moderate-intensity exercise.  Know your numbers.Ask your health care provider to check your cholesterol and your blood glucose. Continue to have your blood tested as directed by your health care provider.  What should I know about cancer screening? There are several types of cancer. Take the following steps to reduce your risk and to catch any cancer development as early as possible. Breast Cancer  Practice breast self-awareness. ? This means understanding how your breasts normally appear and feel. ? It also means doing regular breast self-exams. Let your health care provider know about any changes, no matter how small.  If you are 8 or older, have a clinician do a breast exam (clinical breast exam or CBE) every year. Depending on your age, family history, and medical history, it may be recommended that you also have a yearly breast X-ray (mammogram).  If you have a family history of breast cancer, talk with your health care provider about genetic screening.  If you are at high risk for breast cancer, talk with your health care provider about having an MRI and a mammogram every year.  Breast cancer (BRCA) gene test is recommended for women who have family members with BRCA-related cancers. Results of the assessment will determine the need for genetic counseling and BRCA1 and for BRCA2 testing. BRCA-related cancers include these types: ? Breast. This occurs in males or females. ? Ovarian. ? Tubal. This may also be called fallopian tube cancer. ? Cancer of the abdominal or pelvic lining (peritoneal cancer). ? Prostate. ? Pancreatic.  Cervical, Uterine, and Ovarian Cancer Your health care provider may recommend that you be screened regularly for cancer of the pelvic organs. These include your ovaries, uterus,  and vagina. This screening involves a pelvic exam, which includes checking for microscopic changes to the surface of your cervix (Pap test).  For women ages 21-65, health care providers may recommend a pelvic exam and a Pap test every three years. For women ages 53-65, they may recommend the Pap test and pelvic exam, combined with testing for human papilloma virus (HPV), every five years. Some types of HPV increase your risk of cervical cancer. Testing for HPV may also be done on women of any age who have unclear Pap test results.  Other health care providers may not recommend any screening for nonpregnant women who are considered low risk for pelvic cancer and have no symptoms. Ask your health care provider if a screening pelvic exam is right for you.  If you have had past treatment for cervical cancer or a condition that could lead to cancer, you need Pap tests and screening for cancer for at least 20 years after your treatment. If Pap tests have been discontinued for you, your risk factors (such as having a new sexual partner) need to be reassessed to determine if you should start having screenings again. Some women have medical problems that increase the chance of getting cervical cancer. In these cases, your health care provider may  recommend that you have screening and Pap tests more often.  If you have a family history of uterine cancer or ovarian cancer, talk with your health care provider about genetic screening.  If you have vaginal bleeding after reaching menopause, tell your health care provider.  There are currently no reliable tests available to screen for ovarian cancer.  Lung Cancer Lung cancer screening is recommended for adults 25-46 years old who are at high risk for lung cancer because of a history of smoking. A yearly low-dose CT scan of the lungs is recommended if you:  Currently smoke.  Have a history of at least 30 pack-years of smoking and you currently smoke or have quit  within the past 15 years. A pack-year is smoking an average of one pack of cigarettes per day for one year.  Yearly screening should:  Continue until it has been 15 years since you quit.  Stop if you develop a health problem that would prevent you from having lung cancer treatment.  Colorectal Cancer  This type of cancer can be detected and can often be prevented.  Routine colorectal cancer screening usually begins at age 84 and continues through age 8.  If you have risk factors for colon cancer, your health care provider may recommend that you be screened at an earlier age.  If you have a family history of colorectal cancer, talk with your health care provider about genetic screening.  Your health care provider may also recommend using home test kits to check for hidden blood in your stool.  A small camera at the end of a tube can be used to examine your colon directly (sigmoidoscopy or colonoscopy). This is done to check for the earliest forms of colorectal cancer.  Direct examination of the colon should be repeated every 5-10 years until age 39. However, if early forms of precancerous polyps or small growths are found or if you have a family history or genetic risk for colorectal cancer, you may need to be screened more often.  Skin Cancer  Check your skin from head to toe regularly.  Monitor any moles. Be sure to tell your health care provider: ? About any new moles or changes in moles, especially if there is a change in a mole's shape or color. ? If you have a mole that is larger than the size of a pencil eraser.  If any of your family members has a history of skin cancer, especially at a young age, talk with your health care provider about genetic screening.  Always use sunscreen. Apply sunscreen liberally and repeatedly throughout the day.  Whenever you are outside, protect yourself by wearing long sleeves, pants, a wide-brimmed hat, and sunglasses.  What should I know  about osteoporosis? Osteoporosis is a condition in which bone destruction happens more quickly than new bone creation. After menopause, you may be at an increased risk for osteoporosis. To help prevent osteoporosis or the bone fractures that can happen because of osteoporosis, the following is recommended:  If you are 59-1 years old, get at least 1,000 mg of calcium and at least 600 mg of vitamin D per day.  If you are older than age 70 but younger than age 23, get at least 1,200 mg of calcium and at least 600 mg of vitamin D per day.  If you are older than age 66, get at least 1,200 mg of calcium and at least 800 mg of vitamin D per day.  Smoking and excessive alcohol  intake increase the risk of osteoporosis. Eat foods that are rich in calcium and vitamin D, and do weight-bearing exercises several times each week as directed by your health care provider. What should I know about how menopause affects my mental health? Depression may occur at any age, but it is more common as you become older. Common symptoms of depression include:  Low or sad mood.  Changes in sleep patterns.  Changes in appetite or eating patterns.  Feeling an overall lack of motivation or enjoyment of activities that you previously enjoyed.  Frequent crying spells.  Talk with your health care provider if you think that you are experiencing depression. What should I know about immunizations? It is important that you get and maintain your immunizations. These include:  Tetanus, diphtheria, and pertussis (Tdap) booster vaccine.  Influenza every year before the flu season begins.  Pneumonia vaccine.  Shingles vaccine.  Your health care provider may also recommend other immunizations. This information is not intended to replace advice given to you by your health care provider. Make sure you discuss any questions you have with your health care provider. Document Released: 04/22/2005 Document Revised: 09/18/2015  Document Reviewed: 12/02/2014 Elsevier Interactive Patient Education  2018 [employer].

## 2017-08-21 NOTE — Progress Notes (Signed)
Subjective:    Patient ID: Kelly Mata, female    DOB: 19-May-1950, 67 y.o.   MRN: [id number]  CC: Kelly Mata is a 68 y.o. female who presents today for physical exam.    HPI: Overall doing well  No HA's for past 2 months.    [place]- no evidence of multiple myeloma. Suspect skull lesion incidental, benign Left kidney angiomyolipoma.  [place]- transient HA, NSAIDs prn.    Colorectal Cancer Screening: UTD , 10 year repeat Breast Cancer Screening: Mammogram UTD. Cervical Cancer Screening: h/o hysterectomy Bone Health screening/DEXA for 65+: Done Lung Cancer Screening: Doesn't have 30 year pack year history and age > 75 years. Immunizations       Tetanus - utd        Pneumococcal - Candidate for.   Labs: Screening labs today. Declines full panel as recently had labs with Dr Mike Gip.  Exercise: Gets regular exercise.  Alcohol use: occasional Smoking/tobacco use: Nonsmoker.  Regular dental exams: UTD Wears seat belt: Yes. Skin: h/o squamous cell; follows with dermatology  HISTORY:  Past Medical History:  Diagnosis Date  . Allergy   . Breast cancer ([place]) 2009   neg  . Cancer [facility]) 2009   left breast DCIS, 5 yrs tamoxifen  . Headache   . Heart murmur 2008  . Squamous cell carcinoma     Past Surgical History:  Procedure Laterality Date  . ABDOMINAL HYSTERECTOMY  1998   complete   . BREAST BIOPSY  2008  . BREAST BIOPSY Left 2009   stereo breast biopsy  . BREAST BIOPSY  1998  . BREAST LUMPECTOMY Left 2009   radiation  . COLONOSCOPY  2013  . TONSILLECTOMY  1957   Family History  Problem Relation Age of Onset  . Cancer Other        cousin, breast  . Cancer Father        pancreatic (died at age 31)  . Breast cancer Cousin 67  . Cancer Mother        skin - unsure of type but thinks it was melanoma (died at age 80)  . Cancer Maternal Aunt       ALLERGIES: Ceclor [cefaclor] and Ampicillin  Current Outpatient Medications on File Prior to Visit    Medication Sig Dispense Refill  . ibuprofen (ADVIL,MOTRIN) 200 MG tablet Take 200 mg by mouth every 6 (six) hours as needed.     No current facility-administered medications on file prior to visit.     Social History   Tobacco Use  . Smoking status: Never Smoker  . Smokeless tobacco: Never Used  Substance Use Topics  . Alcohol use: Yes    Comment: 2 glasses of wine  . Drug use: No    Review of Systems  Constitutional: Negative for chills, fever and unexpected weight change.  HENT: Negative for congestion.   Respiratory: Negative for cough.   Cardiovascular: Negative for chest pain, palpitations and leg swelling.  Gastrointestinal: Negative for nausea and vomiting.  Genitourinary: Negative for pelvic pain.  Musculoskeletal: Negative for arthralgias and myalgias.  Skin: Negative for rash.  Neurological: Negative for headaches (resolved).  Hematological: Negative for adenopathy.  Psychiatric/Behavioral: Negative for confusion.      Objective:    BP 130/82 (BP Location: Right Arm, Patient Position: Sitting, Cuff Size: Normal)   Pulse 69   Temp 97.8 F (36.6 C) (Oral)   Resp 16   Wt 161 lb (73 kg)   SpO2 99%  BMI 30.42 kg/m   BP Readings from Last 3 Encounters:  08/21/17 130/82  08/16/17 129/74  07/11/17 (!) 154/93   Wt Readings from Last 3 Encounters:  08/21/17 161 lb (73 kg)  08/16/17 159 lb (72.1 kg)  07/11/17 157 lb 5 oz (71.4 kg)    Physical Exam  Constitutional: She appears well-developed and well-nourished.  Eyes: Conjunctivae are normal.  Neck: No thyroid mass and no thyromegaly present.  Cardiovascular: Normal rate, regular rhythm, normal heart sounds and normal pulses.  Pulmonary/Chest: Effort normal and breath sounds normal. She has no wheezes. She has no rhonchi. She has no rales. Right breast exhibits no inverted nipple, no mass, no nipple discharge, no skin change and no tenderness. Left breast exhibits no inverted nipple, no mass, no nipple  discharge, no skin change and no tenderness. Breasts are symmetrical.  CBE performed.   Lymphadenopathy:       Head (right side): No submental, no submandibular, no tonsillar, no preauricular, no posterior auricular and no occipital adenopathy present.       Head (left side): No submental, no submandibular, no tonsillar, no preauricular, no posterior auricular and no occipital adenopathy present.    She has no cervical adenopathy.       Right cervical: No superficial cervical, no deep cervical and no posterior cervical adenopathy present.      Left cervical: No superficial cervical, no deep cervical and no posterior cervical adenopathy present.    She has no axillary adenopathy.  Neurological: She is alert.  Skin: Skin is warm and dry.  Psychiatric: She has a normal mood and affect. Her speech is normal and behavior is normal. Thought content normal.  Vitals reviewed.      Assessment & Plan:   Problem List Items Addressed This Visit      Cardiovascular and Mediastinum   Atherosclerosis of aorta [facility])    discussed finding on PET scan.  Patient politely declines starting any statin medication.        Musculoskeletal and Integument   Skull lesion     Genitourinary   Angiomyolipoma of left kidney    Discussed unknown clinical significance with patient.  Advised that we should perform renal ultrasound, consult nephrology.  Patient politely declines at this time.  I went ahead and message Dr. Holley Raring, nephrology.  Advised patient to follow-up with me if she has not heard back from me after asking him about surveillance.        Other   Routine physical examination - Primary    History of hysterectomy.  Deferred pelvic exam and in the abscence of complaints, patient preference.  Clinical breast exam performed today.  In the past, patient to follow Dr. Thurman Coyer for mammograms.  Since retired, advised patient to ensure that she schedule mammogram for Fall this year.  Appropriate screening  labs ordered and discussed with patient.      Relevant Orders   Lipid panel   VITAMIN D 25 Hydroxy (Vit-D Deficiency, Fractures)   Ductal carcinoma in situ (DCIS) of left breast       I am having [name]. Schubach maintain her ibuprofen.   No orders of the defined types were placed in this encounter.   Return precautions given.   Risks, benefits, and alternatives of the medications and treatment plan prescribed today were discussed, and patient expressed understanding.   Education regarding symptom management and diagnosis given to patient on AVS.   Continue to follow with Burnard Hawthorne, FNP for routine  health maintenance.   Kelly Mata and I agreed with plan.   Mable Paris, FNP

## 2017-08-21 NOTE — Assessment & Plan Note (Signed)
discussed finding on PET scan.  Patient politely declines starting any statin medication.

## 2017-08-30 ENCOUNTER — Ambulatory Visit (INDEPENDENT_AMBULATORY_CARE_PROVIDER_SITE_OTHER): Payer: PPO

## 2017-08-30 ENCOUNTER — Other Ambulatory Visit (INDEPENDENT_AMBULATORY_CARE_PROVIDER_SITE_OTHER): Payer: PPO

## 2017-08-30 VITALS — BP 110/70 | HR 61 | Temp 98.2°F | Resp 14 | Ht 61.0 in | Wt 161.4 lb

## 2017-08-30 DIAGNOSIS — Z0000 Encounter for general adult medical examination without abnormal findings: Secondary | ICD-10-CM

## 2017-08-30 DIAGNOSIS — Z23 Encounter for immunization: Secondary | ICD-10-CM | POA: Diagnosis not present

## 2017-08-30 DIAGNOSIS — R51 Headache: Secondary | ICD-10-CM | POA: Diagnosis not present

## 2017-08-30 DIAGNOSIS — R519 Headache, unspecified: Secondary | ICD-10-CM

## 2017-08-30 LAB — CBC WITH DIFFERENTIAL/PLATELET
BASOS PCT: 0.2 % (ref 0.0–3.0)
Basophils Absolute: 0 10*3/uL (ref 0.0–0.1)
EOS ABS: 0.2 10*3/uL (ref 0.0–0.7)
EOS PCT: 2.7 % (ref 0.0–5.0)
HEMATOCRIT: 37.2 % (ref 36.0–46.0)
HEMOGLOBIN: 12.4 g/dL (ref 12.0–15.0)
LYMPHS PCT: 26.6 % (ref 12.0–46.0)
Lymphs Abs: 1.6 10*3/uL (ref 0.7–4.0)
MCHC: 33.2 g/dL (ref 30.0–36.0)
MCV: 92.1 fl (ref 78.0–100.0)
Monocytes Absolute: 0.5 10*3/uL (ref 0.1–1.0)
Monocytes Relative: 8.5 % (ref 3.0–12.0)
Neutro Abs: 3.8 10*3/uL (ref 1.4–7.7)
Neutrophils Relative %: 62 % (ref 43.0–77.0)
Platelets: 245 10*3/uL (ref 150.0–400.0)
RBC: 4.04 Mil/uL (ref 3.87–5.11)
RDW: 13.6 % (ref 11.5–15.5)
WBC: 6.1 10*3/uL (ref 4.0–10.5)

## 2017-08-30 LAB — COMPREHENSIVE METABOLIC PANEL
ALBUMIN: 4 g/dL (ref 3.5–5.2)
ALK PHOS: 73 U/L (ref 39–117)
ALT: 15 U/L (ref 0–35)
AST: 14 U/L (ref 0–37)
BILIRUBIN TOTAL: 0.4 mg/dL (ref 0.2–1.2)
BUN: 17 mg/dL (ref 6–23)
CALCIUM: 9.5 mg/dL (ref 8.4–10.5)
CHLORIDE: 105 meq/L (ref 96–112)
CO2: 29 mEq/L (ref 19–32)
CREATININE: 0.86 mg/dL (ref 0.40–1.20)
GFR: 69.94 mL/min (ref >60.00)
Glucose, Bld: 89 mg/dL (ref 70–99)
Potassium: 4.3 mEq/L (ref 3.5–5.1)
SODIUM: 141 meq/L (ref 135–145)
TOTAL PROTEIN: 6.6 g/dL (ref 6.0–8.3)

## 2017-08-30 LAB — B12 AND FOLATE PANEL
FOLATE: 18.9 ng/mL (ref >5.9)
Vitamin B-12: 339 pg/mL (ref 211–911)

## 2017-08-30 LAB — LIPID PANEL
CHOLESTEROL: 180 mg/dL (ref 0–200)
HDL: 66.1 mg/dL (ref >39.00)
LDL Cholesterol: 95 mg/dL (ref 0–99)
NonHDL: 113.56
TRIGLYCERIDES: 92 mg/dL (ref 0.0–149.0)
Total CHOL/HDL Ratio: 3
VLDL: 18.4 mg/dL (ref 0.0–40.0)

## 2017-08-30 LAB — HEMOGLOBIN A1C: HEMOGLOBIN A1C: 5.7 % (ref 4.6–6.5)

## 2017-08-30 LAB — TSH: TSH: 3.53 u[IU]/mL (ref 0.35–4.50)

## 2017-08-30 LAB — VITAMIN D 25 HYDROXY (VIT D DEFICIENCY, FRACTURES): VITD: 21.58 ng/mL — AB (ref 30.00–100.00)

## 2017-08-30 NOTE — Patient Instructions (Addendum)
  Kelly Mata , Thank you for taking time to come for your Medicare Wellness Visit. I appreciate your ongoing commitment to your health goals. Please review the following plan we discussed and let me know if I can assist you in the future.   Follow up as needed.    Bring a copy of your [place] and/or Living Will to be scanned into chart.  Have a great day!  These are the goals we discussed: Goals    . DIET - INCREASE WATER INTAKE     Stay hydrated, drink plenty of fluids    . Increase physical activity     Walk daily 30 minutes       This is a list of the screening recommended for you and due dates:  Health Maintenance  Topic Date Due  . Flu Shot  10/12/2017  . Mammogram  02/08/2019  . Tetanus Vaccine  03/14/2021  . Colon Cancer Screening  03/14/2024  . DEXA scan (bone density measurement)  Completed  .  Hepatitis C: One time screening is recommended by Center for Disease Control  (CDC) for  adults born from 18 through 1965.   Completed  . Pneumonia vaccines  Completed

## 2017-08-30 NOTE — Progress Notes (Addendum)
Subjective:   Kelly Mata is a 67 y.o. female who presents for an Initial Medicare Annual Wellness Visit.  Review of Systems    No ROS.  Medicare Wellness Visit. Additional risk factors are reflected in the social history.  Cardiac Risk Factors include: advanced age (>46men, >69 women)     Objective:    Today's Vitals   08/30/17 0931  BP: 110/70  Pulse: 61  Resp: 14  Temp: 98.2 F (36.8 C)  TempSrc: Oral  SpO2: 99%  Weight: 161 lb 6.4 oz (73.2 kg)  Height: 5\' 1"  (1.549 m)   Body mass index is 30.5 kg/m.  Advanced Directives 08/30/2017 07/11/2017 07/04/2017 06/22/2017  Does Patient Have a Medical Advance Directive? Yes No No No  Type of Paramedic of [place];Living will - - -  Does patient want to make changes to medical advance directive? No - Patient declined - - -  Copy of [place] in Chart? No - copy requested - - -    Current Medications (verified) Outpatient Encounter Medications as of 08/30/2017  Medication Sig  . ibuprofen (ADVIL,MOTRIN) 200 MG tablet Take 200 mg by mouth every 6 (six) hours as needed.   No facility-administered encounter medications on file as of 08/30/2017.     Allergies (verified) Ceclor [cefaclor] and Ampicillin   History: Past Medical History:  Diagnosis Date  . Allergy   . Breast cancer ([place]) 2009   neg  . Cancer [facility]) 2009   left breast DCIS, 5 yrs tamoxifen  . Headache   . Heart murmur 2008  . Squamous cell carcinoma    Past Surgical History:  Procedure Laterality Date  . ABDOMINAL HYSTERECTOMY  1998   complete   . BREAST BIOPSY  2008  . BREAST BIOPSY Left 2009   stereo breast biopsy  . BREAST BIOPSY  1998  . BREAST LUMPECTOMY Left 2009   radiation  . COLONOSCOPY  2013  . TONSILLECTOMY  1957   Family History  Problem Relation Age of Onset  . Cancer Other        cousin, breast  . Cancer Father        pancreatic (died at age 69)  . Breast cancer Cousin 16  .  Cancer Mother        skin - unsure of type but thinks it was melanoma (died at age 28)  . Cancer Maternal Aunt    Social History   Socioeconomic History  . Marital status: Married    Spouse name: Not on file  . Number of children: 2  . Years of education: 16  . Highest education level: Bachelor's degree (e.g., BA, AB, BS)  Occupational History  . Occupation: Retired Corporate treasurer  Social Needs  . Financial resource strain: Not hard at all  . Food insecurity:    Worry: Never true    Inability: Never true  . Transportation needs:    Medical: No    Non-medical: No  Tobacco Use  . Smoking status: Never Smoker  . Smokeless tobacco: Never Used  Substance and Sexual Activity  . Alcohol use: Yes    Comment: 4 glasses of wine  . Drug use: No  . Sexual activity: Not on file  Lifestyle  . Physical activity:    Days per week: 0 days    Minutes per session: Not on file  . Stress: Not at all  Relationships  . Social connections:    Talks on phone: Not  on file    Gets together: Not on file    Attends religious service: Not on file    Active member of club or organization: Not on file    Attends meetings of clubs or organizations: Not on file    Relationship status: Not on file  Other Topics Concern  . Not on file  Social History Narrative   Corporate treasurer   Retired      Married    2 children      Right-handed.   Caffeine use:  1 cup per day.    Tobacco Counseling Counseling given: Not Answered   Clinical Intake:  Pre-visit preparation completed: Yes  Pain : No/denies pain     Nutritional Status: BMI > 30  Obese Diabetes: No  How often do you need to have someone help you when you read instructions, pamphlets, or other written materials from your doctor or pharmacy?: 1 - Never  Interpreter Needed?: No      Activities of Daily Living In your present state of health, do you have any difficulty performing the following activities: 08/30/2017  Hearing?  N  Vision? N  Difficulty concentrating or making decisions? N  Walking or climbing stairs? Y  Comment Chronic L knee pain, intermittent.    Dressing or bathing? N  Doing errands, shopping? N  Preparing Food and eating ? N  Using the Toilet? N  In the past six months, have you accidently leaked urine? N  Do you have problems with loss of bowel control? N  Managing your Medications? N  Managing your Finances? N  Housekeeping or managing your Housekeeping? N  Some recent data might be hidden     Immunizations and Health Maintenance Immunization History  Administered Date(s) Administered  . Influenza-Unspecified 01/10/2017  . Pneumococcal Conjugate-13 06/30/2016  . Pneumococcal Polysaccharide-23 08/30/2017  . Td 08/13/2007   There are no preventive care reminders to display for this patient.  Patient Care Team: Burnard Hawthorne, FNP as PCP - General (Family Medicine) Christene Lye, MD (General Surgery) Ammie Dalton, Okey Regal, MD (Unknown Physician Specialty)  Indicate any recent Medical Services you may have received from other than [facility] providers in the past year (date may be approximate).     Assessment:   This is a routine wellness examination for Kelly Mata. The goal of the wellness visit is to assist the patient how to close the gaps in care and create a preventative care plan for the patient.   The roster of all physicians providing medical care to patient is listed in the Snapshot section of the chart.  Osteoporosis risk reviewed.    Safety issues reviewed; Smoke and carbon monoxide detectors in the home. No firearms in the home. Wears seatbelts when driving or riding with others. No violence in the home.  They do not have excessive sun exposure.  Discussed the need for sun protection: hats, long sleeves and the use of sunscreen if there is significant sun exposure.  Patient is alert, normal appearance, oriented to person/place/and time. Correctly identified  the president of the [place] and recalls of 5/5 words.Performs simple calculations and can read correct time from watch face. Displays appropriate judgement.  No new identified risk were noted.  No failures at ADL's or IADL's.   BMI- discussed the importance of a healthy diet, water intake and the benefits of aerobic exercise. Educational material provided.   24 hour diet recall: Regular diet  Dental- every 6 months.  Eye- Visual acuity  not assessed per patient preference since they have regular follow up with the ophthalmologist.    Sleep patterns- Sleeps through the night without issues.   Pneumovax 23 administered L deltoid, tolerated well. Educational material provided.  Health maintenance gaps- closed.  Patient Concerns: None at this time. Follow up with PCP as needed.  Hearing/Vision screen Hearing Screening Comments: Patient is able to hear conversational tones without difficulty.  No issues reported.  Vision Screening Comments: Followed by [facility] Wears corrective lenses Last OV 07/2017 Cataract extraction, bilateral Visual acuity not assessed per patient preference since they have regular follow up with the ophthalmologist  Dietary issues and exercise activities discussed: Current Exercise Habits: The patient does not participate in regular exercise at present  Goals    . DIET - INCREASE WATER INTAKE     Stay hydrated, drink plenty of fluids    . Increase physical activity     Walk daily 30 minutes      Depression Screen PHQ 2/9 Scores 08/30/2017 08/21/2017 06/23/2016  PHQ - 2 Score 0 0 0  PHQ- 9 Score - 0 -    Fall Risk Fall Risk  08/30/2017 06/23/2016 06/23/2016  Falls in the past year? No No No   Cognitive Function:     6CIT Screen 08/30/2017  What Year? 0 points  What month? 0 points  What time? 0 points  Count back from 20 0 points  Months in reverse 0 points  Repeat phrase 0 points  Total Score 0    Screening Tests Health Maintenance    Topic Date Due  . INFLUENZA VACCINE  10/12/2017  . MAMMOGRAM  02/08/2019  . TETANUS/TDAP  03/14/2021  . COLONOSCOPY  03/14/2024  . DEXA SCAN  Completed  . Hepatitis C Screening  Completed  . PNA vac Low Risk Adult  Completed     Plan:    End of life planning; Advance aging; Advanced directives discussed. Copy of current HCPOA/Living Will requested.    I have personally reviewed and noted the following in the patient's chart:   . Medical and social history . Use of alcohol, tobacco or illicit drugs  . Current medications and supplements . Functional ability and status . Nutritional status . Physical activity . Advanced directives . List of other physicians . Hospitalizations, surgeries, and ER visits in previous 12 months . Vitals . Screenings to include cognitive, depression, and falls . Referrals and appointments  In addition, I have reviewed and discussed with patient certain preventive protocols, quality metrics, and best practice recommendations. A written personalized care plan for preventive services as well as general preventive health recommendations were provided to patient.     Varney Biles, LPN   7/98/9211    Agree with plan. Mable Paris, NP

## 2017-09-06 ENCOUNTER — Telehealth: Payer: Self-pay | Admitting: Family

## 2017-09-06 NOTE — Telephone Encounter (Signed)
Please advise 

## 2017-09-06 NOTE — Telephone Encounter (Signed)
Copied from [address] [phone]. Topic: Quick Communication - See Telephone Encounter >> Sep 06, 2017  5:05 PM Rutherford Nail, NT wrote: CRM for notification. See Telephone encounter for: 09/06/17. Patient calling and states that she has some burning with urination and cloudy urine. Is leaving for [facility] tomorrow morning at [place]. Would like to know if a medication could be sent to the pharmacy? Please advise. CB#: [phone] [id number] - [place], [place]

## 2017-09-07 NOTE — Telephone Encounter (Signed)
Spoke with patient and advised that I just saw the message and it came in after 500 pm on 09/06/17.  I apologized to patient and  that she should have been advised to go get evaluated. Patient is already out of town .  Explained to patient that Kelly Mata wouldn't call in medication for UTI without being  evaluated patient first.   Patient states that she will try to go to walk in clinic to get evaluated.

## 2017-09-08 DIAGNOSIS — R300 Dysuria: Secondary | ICD-10-CM | POA: Diagnosis not present

## 2017-09-08 DIAGNOSIS — N3000 Acute cystitis without hematuria: Secondary | ICD-10-CM | POA: Diagnosis not present

## 2017-09-08 NOTE — Telephone Encounter (Signed)
Noted Agree with advice for UC

## 2018-01-01 ENCOUNTER — Other Ambulatory Visit: Payer: Self-pay

## 2018-01-01 DIAGNOSIS — Z1231 Encounter for screening mammogram for malignant neoplasm of breast: Secondary | ICD-10-CM

## 2018-01-15 ENCOUNTER — Encounter: Payer: Self-pay | Admitting: Family

## 2018-01-17 ENCOUNTER — Encounter: Payer: Self-pay | Admitting: Family

## 2018-01-22 ENCOUNTER — Other Ambulatory Visit: Payer: Self-pay | Admitting: Family

## 2018-01-22 DIAGNOSIS — Z1239 Encounter for other screening for malignant neoplasm of breast: Secondary | ICD-10-CM

## 2018-02-12 ENCOUNTER — Ambulatory Visit
Admission: RE | Admit: 2018-02-12 | Discharge: 2018-02-12 | Disposition: A | Discharge status: Home / Self Care | Payer: PPO | Source: Ambulatory Visit | Attending: General Surgery | Admitting: General Surgery

## 2018-02-12 DIAGNOSIS — Z1231 Encounter for screening mammogram for malignant neoplasm of breast: Secondary | ICD-10-CM | POA: Insufficient documentation

## 2018-02-12 HISTORY — DX: Personal history of irradiation: Z92.3

## 2018-02-14 ENCOUNTER — Encounter: Payer: Self-pay | Admitting: Family

## 2018-02-20 ENCOUNTER — Ambulatory Visit: Payer: PPO | Admitting: General Surgery

## 2018-02-23 ENCOUNTER — Encounter: Payer: Self-pay | Admitting: Family

## 2018-02-23 ENCOUNTER — Ambulatory Visit (INDEPENDENT_AMBULATORY_CARE_PROVIDER_SITE_OTHER): Payer: PPO | Admitting: Family

## 2018-02-23 VITALS — BP 126/82 | HR 83 | Temp 97.9°F | Resp 16 | Ht 61.0 in | Wt 165.0 lb

## 2018-02-23 DIAGNOSIS — R197 Diarrhea, unspecified: Secondary | ICD-10-CM

## 2018-02-23 DIAGNOSIS — D1771 Benign lipomatous neoplasm of kidney: Secondary | ICD-10-CM | POA: Diagnosis not present

## 2018-02-23 NOTE — Assessment & Plan Note (Signed)
Benign abdominal exam.  Pending stool cultures ,studies

## 2018-02-23 NOTE — Progress Notes (Signed)
Subjective:    Patient ID: Kelly Mata, female    DOB: 1950-05-15, 67 y.o.   MRN: [id number]  CC: Kelly Mata is a 67 y.o. female who presents today for follow up.   HPI: 6 months ago , exposore to parasite while in the pool, [employer].  At that time, had intermiitent non bloody diarrhea. Describes as mucous.  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 Formed and then appears to powder like when flushes. Has had stool incontinence.  No lactose, gluten intolerance.  Diarrhea not exacerbated by meals.  No right upper quadrant pain.  Bowel movements have not been in the same since.   History of left breast cancer, 2009  Mammogram UTD.   Colonoscopy Kelly Mata 2016. Normal per patient. Told to repeat in 10 years.     Mammogram done 12/2/219, Kelly Mata.  BI-RADS 1. HISTORY:  Past Medical History:  Diagnosis Date  . Allergy   . Breast cancer ([place]) 2009   neg  . Cancer [facility]) 2009   left breast DCIS, 5 yrs tamoxifen  . Headache   . Heart murmur 2008  . Personal history of radiation therapy   . Squamous cell carcinoma    Past Surgical History:  Procedure Laterality Date  . ABDOMINAL HYSTERECTOMY  1998   complete   . BREAST BIOPSY Left 2009   stereo breast biopsy, +  . BREAST CYST ASPIRATION  90s  . BREAST EXCISIONAL BIOPSY Left 2008   neg  . BREAST EXCISIONAL BIOPSY Left 1998  . BREAST LUMPECTOMY Left 2009   radiation  . COLONOSCOPY  2013  . OOPHORECTOMY    . TONSILLECTOMY  1957   Family History  Problem Relation Age of Onset  . Cancer Other        cousin, breast  . Cancer Father        pancreatic (died at age 67)  . Breast cancer Cousin 48  . Cancer Mother        skin - unsure of type but thinks it was melanoma (died at age 34)  . Cancer Maternal Aunt     Allergies: Ceclor [cefaclor] and Ampicillin Current Outpatient Medications on File Prior to Visit  Medication Sig Dispense Refill  . ibuprofen (ADVIL,MOTRIN) 200 MG tablet Take  200 mg by mouth every 6 (six) hours as needed.     No current facility-administered medications on file prior to visit.     Social History   Tobacco Use  . Smoking status: Never Smoker  . Smokeless tobacco: Never Used  Substance Use Topics  . Alcohol use: Yes    Comment: 4 glasses of wine  . Drug use: No    Review of Systems  Constitutional: Negative for chills, fever and unexpected weight change.  Respiratory: Negative for cough.   Cardiovascular: Negative for chest pain and palpitations.  Gastrointestinal: Positive for diarrhea. Negative for abdominal distention, abdominal pain, anal bleeding, blood in stool, constipation, nausea and vomiting.      Objective:    BP 126/82 (BP Location: Left Arm, Patient Position: Sitting, Cuff Size: Normal)   Pulse 83   Temp 97.9 F (36.6 C) (Oral)   Resp 16   Ht 5\' 1"  (1.549 m)   Wt 165 lb (74.8 kg)   SpO2 98%   BMI 31.18 kg/m  BP Readings from Last 3 Encounters:  02/23/18 126/82  08/30/17 110/70  08/21/17 130/82   Wt Readings from Last 3 Encounters:  02/23/18 165 lb (74.8 kg)  08/30/17 161 lb 6.4 oz (73.2 kg)  08/21/17 161 lb (73 kg)    Physical Exam Vitals signs reviewed.  Constitutional:      Appearance: Normal appearance. She is well-developed.  Eyes:     Conjunctiva/sclera: Conjunctivae normal.  Cardiovascular:     Rate and Rhythm: Normal rate and regular rhythm.     Pulses: Normal pulses.     Heart sounds: Normal heart sounds.  Pulmonary:     Effort: Pulmonary effort is normal.     Breath sounds: Normal breath sounds. No wheezing, rhonchi or rales.  Abdominal:     General: Bowel sounds are normal. There is no distension.     Palpations: Abdomen is soft. Abdomen is not rigid. There is no fluid wave or mass.     Tenderness: There is no abdominal tenderness. There is no guarding or rebound.  Skin:    General: Skin is warm and dry.  Neurological:     Mental Status: She is alert.  Psychiatric:        Speech:  Speech normal.        Behavior: Behavior normal.        Thought Content: Thought content normal.        Assessment & Plan:   Problem List Items Addressed This Visit      Genitourinary   Angiomyolipoma of kidney    Patient declines nephrology consult at this time.  In lieu of this, patient and I agreed I would message Kelly Mata in regards to any surveillance, follow-up  this may require.        Other   Diarrhea - Primary    Benign abdominal exam.  Pending stool cultures ,studies      Relevant Orders   Ova and parasite examination   Fecal occult blood, imunochemical   Gastrointestinal Pathogen Panel PCR   Stool culture       I am having Kelly Mata maintain her ibuprofen.   No orders of the defined types were placed in this encounter.   Return precautions given.   Risks, benefits, and alternatives of the medications and treatment plan prescribed today were discussed, and patient expressed understanding.   Education regarding symptom management and diagnosis given to patient on AVS.  Continue to follow with Kelly Hawthorne, FNP for routine health maintenance.   Kelly Mata and I agreed with plan.   Mable Paris, FNP

## 2018-02-23 NOTE — Assessment & Plan Note (Signed)
Patient declines nephrology consult at this time.  In lieu of this, patient and I agreed I would message Dr. Holley Raring in regards to any surveillance, follow-up  this may require.

## 2018-02-23 NOTE — Patient Instructions (Signed)
Let me know if you do not hear from me regarding Small left renal angiomyolipoma from  Dr Holley Raring ( nephrology).   Please bring back stool studies.

## 2018-02-26 ENCOUNTER — Other Ambulatory Visit (INDEPENDENT_AMBULATORY_CARE_PROVIDER_SITE_OTHER): Payer: PPO

## 2018-02-26 DIAGNOSIS — R197 Diarrhea, unspecified: Secondary | ICD-10-CM | POA: Diagnosis not present

## 2018-03-02 ENCOUNTER — Other Ambulatory Visit (INDEPENDENT_AMBULATORY_CARE_PROVIDER_SITE_OTHER): Payer: PPO

## 2018-03-02 DIAGNOSIS — R197 Diarrhea, unspecified: Secondary | ICD-10-CM | POA: Diagnosis not present

## 2018-03-02 LAB — FECAL OCCULT BLOOD, IMMUNOCHEMICAL: Fecal Occult Bld: NEGATIVE

## 2018-03-04 ENCOUNTER — Other Ambulatory Visit: Payer: Self-pay | Admitting: Family

## 2018-03-04 DIAGNOSIS — A0472 Enterocolitis due to Clostridium difficile, not specified as recurrent: Secondary | ICD-10-CM

## 2018-03-04 MED ORDER — VANCOMYCIN HCL 125 MG PO CAPS: 125.0000 mg | ORAL_CAPSULE | Freq: Four times a day (QID) | ORAL | 0 refills | Status: AC

## 2018-03-05 ENCOUNTER — Other Ambulatory Visit: Payer: Self-pay

## 2018-03-05 DIAGNOSIS — R197 Diarrhea, unspecified: Secondary | ICD-10-CM

## 2018-03-07 LAB — GASTROINTESTINAL PATHOGEN PANEL PCR
C. difficile Tox A/B, PCR: UNDETERMINED — AB
Campylobacter, PCR: UNDETERMINED — AB
Cryptosporidium, PCR: UNDETERMINED — AB
E COLI (ETEC) LT/ST, PCR: UNDETERMINED — AB
E COLI 0157, PCR: UNDETERMINED — AB
E coli (STEC) stx1/stx2, PCR: UNDETERMINED — AB
Giardia lamblia, PCR: UNDETERMINED — AB
Norovirus, PCR: UNDETERMINED — AB
Rotavirus A, PCR: UNDETERMINED — AB
Salmonella, PCR: UNDETERMINED — AB
Shigella, PCR: UNDETERMINED — AB

## 2018-03-07 LAB — STOOL CULTURE
MICRO NUMBER: 91501924
MICRO NUMBER:: 91501925
MICRO NUMBER:: 91501927
SHIGA RESULT: NOT DETECTED
SPECIMEN QUALITY:: ADEQUATE
SPECIMEN QUALITY:: ADEQUATE
SPECIMEN QUALITY:: ADEQUATE

## 2018-03-07 LAB — OVA AND PARASITE EXAMINATION
CONCENTRATE RESULT:: NONE SEEN
MICRO NUMBER:: 91501926
SPECIMEN QUALITY:: ADEQUATE
TRICHROME RESULT:: NONE SEEN

## 2018-03-08 ENCOUNTER — Other Ambulatory Visit (INDEPENDENT_AMBULATORY_CARE_PROVIDER_SITE_OTHER): Payer: PPO

## 2018-03-08 DIAGNOSIS — R197 Diarrhea, unspecified: Secondary | ICD-10-CM

## 2018-03-09 ENCOUNTER — Other Ambulatory Visit: Payer: Self-pay | Admitting: Family

## 2018-03-09 ENCOUNTER — Encounter: Payer: Self-pay | Admitting: Family

## 2018-03-09 DIAGNOSIS — R197 Diarrhea, unspecified: Secondary | ICD-10-CM

## 2018-03-12 LAB — GASTROINTESTINAL PATHOGEN PANEL PCR
C. difficile Tox A/B, PCR: NOT DETECTED
Campylobacter, PCR: NOT DETECTED
Cryptosporidium, PCR: NOT DETECTED
E coli (ETEC) LT/ST PCR: NOT DETECTED
E coli (STEC) stx1/stx2, PCR: NOT DETECTED
E coli 0157, PCR: NOT DETECTED
Giardia lamblia, PCR: NOT DETECTED
Norovirus, PCR: NOT DETECTED
Rotavirus A, PCR: NOT DETECTED
SALMONELLA, PCR: NOT DETECTED
Shigella, PCR: NOT DETECTED

## 2018-04-04 ENCOUNTER — Encounter: Payer: Self-pay | Admitting: Family

## 2018-04-04 ENCOUNTER — Ambulatory Visit (INDEPENDENT_AMBULATORY_CARE_PROVIDER_SITE_OTHER): Payer: PPO | Admitting: Family

## 2018-04-04 VITALS — BP 122/82 | HR 72 | Temp 97.8°F | Wt 164.4 lb

## 2018-04-04 DIAGNOSIS — R300 Dysuria: Secondary | ICD-10-CM

## 2018-04-04 DIAGNOSIS — D1771 Benign lipomatous neoplasm of kidney: Secondary | ICD-10-CM

## 2018-04-04 LAB — POCT URINALYSIS DIPSTICK
Bilirubin, UA: NEGATIVE
Glucose, UA: NEGATIVE
KETONES UA: NEGATIVE
Nitrite, UA: NEGATIVE
Protein, UA: NEGATIVE
SPEC GRAV UA: 1.015 (ref 1.010–1.025)
UROBILINOGEN UA: 0.2 U/dL
pH, UA: 6 (ref 5.0–8.0)

## 2018-04-04 LAB — URINALYSIS, MICROSCOPIC ONLY

## 2018-04-04 MED ORDER — NITROFURANTOIN MONOHYD MACRO 100 MG PO CAPS: 100.0000 mg | ORAL_CAPSULE | Freq: Two times a day (BID) | ORAL | 0 refills | Status: DC

## 2018-04-04 NOTE — Progress Notes (Signed)
Subjective:    Patient ID: Kelly Mata, female    DOB: 07/14/1950, 68 y.o.   MRN: [id number]  CC: Kelly Mata is a 68 y.o. female who presents today for an acute visit.    HPI: CC: dysuria x 3 days, unchanged.  Drinking lots of water, cranberry juice.  No severe flank pain, fever, n , v, hematuria, changes to vaginal discharge.   No recurrent utis.         Angiomyolipoma of kidney. Never heard back from Dr Holley Raring Diarrhea has resolved.  HISTORY:  Past Medical History:  Diagnosis Date  . Allergy   . Breast cancer ([place]) 2009   neg  . Cancer [facility]) 2009   left breast DCIS, 5 yrs tamoxifen  . Headache   . Heart murmur 2008  . Personal history of radiation therapy   . Squamous cell carcinoma    Past Surgical History:  Procedure Laterality Date  . ABDOMINAL HYSTERECTOMY  1998   complete   . BREAST BIOPSY Left 2009   stereo breast biopsy, +  . BREAST CYST ASPIRATION  90s  . BREAST EXCISIONAL BIOPSY Left 2008   neg  . BREAST EXCISIONAL BIOPSY Left 1998  . BREAST LUMPECTOMY Left 2009   radiation  . COLONOSCOPY  2013  . OOPHORECTOMY    . TONSILLECTOMY  1957   Family History  Problem Relation Age of Onset  . Cancer Other        cousin, breast  . Cancer Father        pancreatic (died at age 40)  . Breast cancer Cousin 85  . Cancer Mother        skin - unsure of type but thinks it was melanoma (died at age 73)  . Cancer Maternal Aunt     Allergies: Ceclor [cefaclor] and Ampicillin Current Outpatient Medications on File Prior to Visit  Medication Sig Dispense Refill  . ibuprofen (ADVIL,MOTRIN) 200 MG tablet Take 200 mg by mouth every 6 (six) hours as needed.     No current facility-administered medications on file prior to visit.     Social History   Tobacco Use  . Smoking status: Never Smoker  . Smokeless tobacco: Never Used  Substance Use Topics  . Alcohol use: Yes    Comment: 4 glasses of wine  . Drug use: No    Review of Systems    Constitutional: Negative for chills and fever.  Respiratory: Negative for cough.   Cardiovascular: Negative for chest pain and palpitations.  Gastrointestinal: Negative for abdominal pain, nausea and vomiting.  Genitourinary: Positive for dysuria. Negative for flank pain, vaginal bleeding, vaginal discharge and vaginal pain.      Objective:    BP 122/82   Pulse 72   Temp 97.8 F (36.6 C)   Wt 164 lb 6.4 oz (74.6 kg)   SpO2 97%   BMI 31.06 kg/m    Physical Exam Vitals signs reviewed.  Constitutional:      Appearance: She is well-developed.  Cardiovascular:     Rate and Rhythm: Normal rate and regular rhythm.     Pulses: Normal pulses.     Heart sounds: Normal heart sounds.  Pulmonary:     Effort: Pulmonary effort is normal.     Breath sounds: Normal breath sounds. No wheezing, rhonchi or rales.  Skin:    General: Skin is warm and dry.  Neurological:     Mental Status: She is alert.  Psychiatric:  Speech: Speech normal.        Behavior: Behavior normal.        Thought Content: Thought content normal.        Assessment & Plan:      I am having Kelly Mata. Kelly Mata start on nitrofurantoin (macrocrystal-monohydrate). I am also having her maintain her ibuprofen.   Meds ordered this encounter  Medications  . nitrofurantoin, macrocrystal-monohydrate, (MACROBID) 100 MG capsule    Sig: Take 1 capsule (100 mg total) by mouth 2 (two) times daily. Take with food.    Dispense:  10 capsule    Refill:  0    Order Specific Question:   Supervising Provider    Answer:   Kelly Mata [[id number]]    Return precautions given.   Risks, benefits, and alternatives of the medications and treatment plan prescribed today were discussed, and patient expressed understanding.   Education regarding symptom management and diagnosis given to patient on AVS.  Continue to follow with Kelly Hawthorne, FNP for routine health maintenance.   Kelly Mata and I agreed with plan.    Kelly Paris, FNP

## 2018-04-04 NOTE — Assessment & Plan Note (Signed)
Call out to Kelly Mata's office since no response.  Patient declines consult, renal ultrasound until we hear back.  She will let me know if she does not hear back in the office in regards to this.

## 2018-04-04 NOTE — Patient Instructions (Addendum)
Start [employer]. We will call you with culture results.  Please call the office and let me know if you do not hear from me regarding angiomyolipoma and if you need to be seen by nephrology.

## 2018-04-04 NOTE — Progress Notes (Signed)
I called LM with Dr. Elwyn Lade nurse to call back in regards to below.

## 2018-04-04 NOTE — Assessment & Plan Note (Signed)
Red blood cells, leukocytes seen on urine dip.  Pending urine culture.  Patient is well-appearing today, afebrile.  Her preference to go ahead start antibiotic therapy.  Will start Macrobid until culture is returned.

## 2018-04-06 ENCOUNTER — Other Ambulatory Visit: Payer: Self-pay | Admitting: Family

## 2018-04-06 LAB — URINE CULTURE
MICRO NUMBER: 88970
SPECIMEN QUALITY:: ADEQUATE

## 2018-04-06 NOTE — Progress Notes (Signed)
I spoke with Dr. Elwyn Lade nurse Anderson Malta & she said since patient was not yet established to send them a referral. She also stated that we could order the [place] so they could have that to review with patient at her appointment if we though that was needed. Dr. Holley Raring does do rounds at the hospital & has Epic access, but she wasn't sure how often he sees messages that are sent.

## 2018-04-09 ENCOUNTER — Encounter: Payer: Self-pay | Admitting: Family

## 2018-04-10 ENCOUNTER — Other Ambulatory Visit: Payer: Self-pay | Admitting: Family

## 2018-04-10 DIAGNOSIS — D1771 Benign lipomatous neoplasm of kidney: Secondary | ICD-10-CM

## 2018-04-30 DIAGNOSIS — D1771 Benign lipomatous neoplasm of kidney: Secondary | ICD-10-CM | POA: Diagnosis not present

## 2018-05-02 ENCOUNTER — Other Ambulatory Visit: Payer: Self-pay | Admitting: Nephrology

## 2018-05-02 ENCOUNTER — Other Ambulatory Visit (HOSPITAL_COMMUNITY): Payer: Self-pay | Admitting: Nephrology

## 2018-05-02 DIAGNOSIS — D1771 Benign lipomatous neoplasm of kidney: Secondary | ICD-10-CM

## 2018-05-03 DIAGNOSIS — Z08 Encounter for follow-up examination after completed treatment for malignant neoplasm: Secondary | ICD-10-CM | POA: Diagnosis not present

## 2018-05-03 DIAGNOSIS — D0462 Carcinoma in situ of skin of left upper limb, including shoulder: Secondary | ICD-10-CM | POA: Diagnosis not present

## 2018-05-03 DIAGNOSIS — D2271 Melanocytic nevi of right lower limb, including hip: Secondary | ICD-10-CM | POA: Diagnosis not present

## 2018-05-03 DIAGNOSIS — D2262 Melanocytic nevi of left upper limb, including shoulder: Secondary | ICD-10-CM | POA: Diagnosis not present

## 2018-05-03 DIAGNOSIS — L821 Other seborrheic keratosis: Secondary | ICD-10-CM | POA: Diagnosis not present

## 2018-05-03 DIAGNOSIS — Z85828 Personal history of other malignant neoplasm of skin: Secondary | ICD-10-CM | POA: Diagnosis not present

## 2018-05-03 DIAGNOSIS — D225 Melanocytic nevi of trunk: Secondary | ICD-10-CM | POA: Diagnosis not present

## 2018-05-03 DIAGNOSIS — D2261 Melanocytic nevi of right upper limb, including shoulder: Secondary | ICD-10-CM | POA: Diagnosis not present

## 2018-05-03 DIAGNOSIS — D485 Neoplasm of uncertain behavior of skin: Secondary | ICD-10-CM | POA: Diagnosis not present

## 2018-05-03 DIAGNOSIS — D2272 Melanocytic nevi of left lower limb, including hip: Secondary | ICD-10-CM | POA: Diagnosis not present

## 2018-05-08 ENCOUNTER — Ambulatory Visit
Admission: RE | Admit: 2018-05-08 | Discharge: 2018-05-08 | Disposition: A | Discharge status: Home / Self Care | Payer: PPO | Source: Ambulatory Visit | Attending: Nephrology | Admitting: Nephrology

## 2018-05-08 DIAGNOSIS — D1771 Benign lipomatous neoplasm of kidney: Secondary | ICD-10-CM | POA: Insufficient documentation

## 2018-05-17 DIAGNOSIS — D0462 Carcinoma in situ of skin of left upper limb, including shoulder: Secondary | ICD-10-CM | POA: Diagnosis not present

## 2018-05-17 DIAGNOSIS — C44629 Squamous cell carcinoma of skin of left upper limb, including shoulder: Secondary | ICD-10-CM | POA: Diagnosis not present

## 2018-05-24 ENCOUNTER — Ambulatory Visit: Payer: PPO | Admitting: General Surgery

## 2018-06-19 ENCOUNTER — Encounter: Payer: Self-pay | Admitting: Family

## 2018-06-19 ENCOUNTER — Telehealth: Payer: PPO | Admitting: Physician Assistant

## 2018-06-19 DIAGNOSIS — R509 Fever, unspecified: Secondary | ICD-10-CM

## 2018-06-19 DIAGNOSIS — R0789 Other chest pain: Secondary | ICD-10-CM

## 2018-06-19 NOTE — Progress Notes (Signed)
Based off of your report of heaviness in your chest I think you should seek evaluation immediately.  I recommend that you be seen and evaluated "face to face". Our Emergency Departments are best equipped to handle patients with severe symptoms.   I recommend the following:  . If you are having a true medical emergency please call 911. . If you are considered high risk for Corona virus because of a known exposure, fever, shortness of breath and cough, OR if you have severe symptoms of any kind, seek medical care at an emergency room.  . Please call ahead and tell them that you were seen by telemedicine and they have recommended that you have a face to face evaluation. . [facility] Emergency Department [place], [place], [place] [address] [phone]  . [facility] Emergency Department [place], [place], [place] [address] [phone]  . [facility] Emergency Department [place], [place], [place] [address] [phone]  . [facility] Emergency Department [address] [place], [place], [place] [address] [phone]  . [facility] Emergency Department [place], [place], [place] [address] [phone]  NOTE: If you entered your credit card information for this eVisit, you will not be charged. You may see a "hold" on your card for the $35 but that hold will drop off and you will not have a charge processed.   Your e-visit answers were reviewed by a board certified advanced clinical practitioner to complete your personal care plan.  Thank you for using e-Visits.

## 2018-08-24 ENCOUNTER — Encounter: Payer: PPO | Admitting: Family

## 2018-08-31 ENCOUNTER — Other Ambulatory Visit: Payer: Self-pay

## 2018-08-31 ENCOUNTER — Encounter: Payer: Self-pay | Admitting: Family

## 2018-08-31 ENCOUNTER — Ambulatory Visit (INDEPENDENT_AMBULATORY_CARE_PROVIDER_SITE_OTHER): Payer: PPO

## 2018-08-31 ENCOUNTER — Ambulatory Visit (INDEPENDENT_AMBULATORY_CARE_PROVIDER_SITE_OTHER): Payer: PPO | Admitting: Family

## 2018-08-31 ENCOUNTER — Ambulatory Visit: Payer: PPO

## 2018-08-31 VITALS — BP 122/76 | HR 72 | Temp 98.0°F | Ht 61.0 in | Wt 160.4 lb

## 2018-08-31 DIAGNOSIS — Z0000 Encounter for general adult medical examination without abnormal findings: Secondary | ICD-10-CM

## 2018-08-31 DIAGNOSIS — M25562 Pain in left knee: Secondary | ICD-10-CM

## 2018-08-31 DIAGNOSIS — D1771 Benign lipomatous neoplasm of kidney: Secondary | ICD-10-CM

## 2018-08-31 DIAGNOSIS — G8929 Other chronic pain: Secondary | ICD-10-CM | POA: Diagnosis not present

## 2018-08-31 LAB — COMPREHENSIVE METABOLIC PANEL
ALT: 16 U/L (ref 0–35)
AST: 14 U/L (ref 0–37)
Albumin: 4.3 g/dL (ref 3.5–5.2)
Alkaline Phosphatase: 83 U/L (ref 39–117)
BUN: 15 mg/dL (ref 6–23)
CO2: 26 mEq/L (ref 19–32)
Calcium: 9.5 mg/dL (ref 8.4–10.5)
Chloride: 104 mEq/L (ref 96–112)
Creatinine, Ser: 0.9 mg/dL (ref 0.40–1.20)
GFR: 62.25 mL/min (ref >60.00)
Glucose, Bld: 85 mg/dL (ref 70–99)
Potassium: 4.1 mEq/L (ref 3.5–5.1)
Sodium: 140 mEq/L (ref 135–145)
Total Bilirubin: 0.5 mg/dL (ref 0.2–1.2)
Total Protein: 6.5 g/dL (ref 6.0–8.3)

## 2018-08-31 LAB — LIPID PANEL
Cholesterol: 200 mg/dL (ref 0–200)
HDL: 66.2 mg/dL (ref >39.00)
LDL Cholesterol: 117 mg/dL — ABNORMAL HIGH (ref 0–99)
NonHDL: 133.78
Total CHOL/HDL Ratio: 3
Triglycerides: 83 mg/dL (ref 0.0–149.0)
VLDL: 16.6 mg/dL (ref 0.0–40.0)

## 2018-08-31 LAB — VITAMIN D 25 HYDROXY (VIT D DEFICIENCY, FRACTURES): VITD: 23.83 ng/mL — ABNORMAL LOW (ref 30.00–100.00)

## 2018-08-31 LAB — HEMOGLOBIN A1C: Hgb A1c MFr Bld: 5.6 % (ref 4.6–6.5)

## 2018-08-31 NOTE — Assessment & Plan Note (Signed)
Clinical breast exam performed.  Patient politely declines pelvic exam as she has no complaints today and h/o hysterectomy. She doesn't have cervix per patient.Declines DEXA due to COVID and will discuss again next year.    Note: We discussed relevant and past history of abnormal labs today at length, we decided to order appropriate labs for screening today based on this discussion. Patient was comfortable with this. Patient was advised if any symptoms were to change after today's visit, to notify me as we may always order additional labs.

## 2018-08-31 NOTE — Patient Instructions (Addendum)
Will do bone density next year.   Let me know if you would like physical therapy at any point for you knree.   Health Maintenance for Postmenopausal Women Menopause is a normal process in which your reproductive ability comes to an end. This process happens gradually over a span of months to years, usually between the ages of 46 and 61. Menopause is complete when you have missed 12 consecutive menstrual periods. It is important to talk with your health care provider about some of the most common conditions that affect postmenopausal women, such as heart disease, cancer, and bone loss (osteoporosis). Adopting a healthy lifestyle and getting preventive care can help to promote your health and wellness. Those actions can also lower your chances of developing some of these common conditions. What should I know about menopause? During menopause, you may experience a number of symptoms, such as:  Moderate-to-severe hot flashes.  Night sweats.  Decrease in sex drive.  Mood swings.  Headaches.  Tiredness.  Irritability.  Memory problems.  Insomnia. Choosing to treat or not to treat menopausal changes is an individual decision that you make with your health care provider. What should I know about hormone replacement therapy and supplements? Hormone therapy products are effective for treating symptoms that are associated with menopause, such as hot flashes and night sweats. Hormone replacement carries certain risks, especially as you become older. If you are thinking about using estrogen or estrogen with progestin treatments, discuss the benefits and risks with your health care provider. What should I know about heart disease and stroke? Heart disease, heart attack, and stroke become more likely as you age. This may be due, in part, to the hormonal changes that your body experiences during menopause. These can affect how your body processes dietary fats, triglycerides, and cholesterol. Heart  attack and stroke are both medical emergencies. There are many things that you can do to help prevent heart disease and stroke:  Have your blood pressure checked at least every 1-2 years. High blood pressure causes heart disease and increases the risk of stroke.  If you are 84-85 years old, ask your health care provider if you should take aspirin to prevent a heart attack or a stroke.  Do not use any tobacco products, including cigarettes, chewing tobacco, or electronic cigarettes. If you need help quitting, ask your health care provider.  It is important to eat a healthy diet and maintain a healthy weight. ? Be sure to include plenty of vegetables, fruits, low-fat dairy products, and lean protein. ? Avoid eating foods that are high in solid fats, added sugars, or salt (sodium).  Get regular exercise. This is one of the most important things that you can do for your health. ? Try to exercise for at least 150 minutes each week. The type of exercise that you do should increase your heart rate and make you sweat. This is known as moderate-intensity exercise. ? Try to do strengthening exercises at least twice each week. Do these in addition to the moderate-intensity exercise.  Know your numbers.Ask your health care provider to check your cholesterol and your blood glucose. Continue to have your blood tested as directed by your health care provider.  What should I know about cancer screening? There are several types of cancer. Take the following steps to reduce your risk and to catch any cancer development as early as possible. Breast Cancer  Practice breast self-awareness. ? This means understanding how your breasts normally appear and feel. ? It  also means doing regular breast self-exams. Let your health care provider know about any changes, no matter how small.  If you are 36 or older, have a clinician do a breast exam (clinical breast exam or CBE) every year. Depending on your age, family  history, and medical history, it may be recommended that you also have a yearly breast X-ray (mammogram).  If you have a family history of breast cancer, talk with your health care provider about genetic screening.  If you are at high risk for breast cancer, talk with your health care provider about having an MRI and a mammogram every year.  Breast cancer (BRCA) gene test is recommended for women who have family members with BRCA-related cancers. Results of the assessment will determine the need for genetic counseling and BRCA1 and for BRCA2 testing. BRCA-related cancers include these types: ? Breast. This occurs in males or females. ? Ovarian. ? Tubal. This may also be called fallopian tube cancer. ? Cancer of the abdominal or pelvic lining (peritoneal cancer). ? Prostate. ? Pancreatic. Cervical, Uterine, and Ovarian Cancer Your health care provider may recommend that you be screened regularly for cancer of the pelvic organs. These include your ovaries, uterus, and vagina. This screening involves a pelvic exam, which includes checking for microscopic changes to the surface of your cervix (Pap test).  For women ages 21-65, health care providers may recommend a pelvic exam and a Pap test every three years. For women ages 8-65, they may recommend the Pap test and pelvic exam, combined with testing for human papilloma virus (HPV), every five years. Some types of HPV increase your risk of cervical cancer. Testing for HPV may also be done on women of any age who have unclear Pap test results.  Other health care providers may not recommend any screening for nonpregnant women who are considered low risk for pelvic cancer and have no symptoms. Ask your health care provider if a screening pelvic exam is right for you.  If you have had past treatment for cervical cancer or a condition that could lead to cancer, you need Pap tests and screening for cancer for at least 20 years after your treatment. If Pap  tests have been discontinued for you, your risk factors (such as having a new sexual partner) need to be reassessed to determine if you should start having screenings again. Some women have medical problems that increase the chance of getting cervical cancer. In these cases, your health care provider may recommend that you have screening and Pap tests more often.  If you have a family history of uterine cancer or ovarian cancer, talk with your health care provider about genetic screening.  If you have vaginal bleeding after reaching menopause, tell your health care provider.  There are currently no reliable tests available to screen for ovarian cancer. Lung Cancer Lung cancer screening is recommended for adults 45-56 years old who are at high risk for lung cancer because of a history of smoking. A yearly low-dose CT scan of the lungs is recommended if you:  Currently smoke.  Have a history of at least 30 pack-years of smoking and you currently smoke or have quit within the past 15 years. A pack-year is smoking an average of one pack of cigarettes per day for one year. Yearly screening should:  Continue until it has been 15 years since you quit.  Stop if you develop a health problem that would prevent you from having lung cancer treatment. Colorectal Cancer  This type of cancer can be detected and can often be prevented.  Routine colorectal cancer screening usually begins at age 66 and continues through age 67.  If you have risk factors for colon cancer, your health care provider may recommend that you be screened at an earlier age.  If you have a family history of colorectal cancer, talk with your health care provider about genetic screening.  Your health care provider may also recommend using home test kits to check for hidden blood in your stool.  A small camera at the end of a tube can be used to examine your colon directly (sigmoidoscopy or colonoscopy). This is done to check for  the earliest forms of colorectal cancer.  Direct examination of the colon should be repeated every 5-10 years until age 57. However, if early forms of precancerous polyps or small growths are found or if you have a family history or genetic risk for colorectal cancer, you may need to be screened more often. Skin Cancer  Check your skin from head to toe regularly.  Monitor any moles. Be sure to tell your health care provider: ? About any new moles or changes in moles, especially if there is a change in a mole's shape or color. ? If you have a mole that is larger than the size of a pencil eraser.  If any of your family members has a history of skin cancer, especially at a young age, talk with your health care provider about genetic screening.  Always use sunscreen. Apply sunscreen liberally and repeatedly throughout the day.  Whenever you are outside, protect yourself by wearing long sleeves, pants, a wide-brimmed hat, and sunglasses. What should I know about osteoporosis? Osteoporosis is a condition in which bone destruction happens more quickly than new bone creation. After menopause, you may be at an increased risk for osteoporosis. To help prevent osteoporosis or the bone fractures that can happen because of osteoporosis, the following is recommended:  If you are 70-49 years old, get at least 1,000 mg of calcium and at least 600 mg of vitamin D per day.  If you are older than age 55 but younger than age 24, get at least 1,200 mg of calcium and at least 600 mg of vitamin D per day.  If you are older than age 40, get at least 1,200 mg of calcium and at least 800 mg of vitamin D per day. Smoking and excessive alcohol intake increase the risk of osteoporosis. Eat foods that are rich in calcium and vitamin D, and do weight-bearing exercises several times each week as directed by your health care provider. What should I know about how menopause affects my mental health? Depression may occur at  any age, but it is more common as you become older. Common symptoms of depression include:  Low or sad mood.  Changes in sleep patterns.  Changes in appetite or eating patterns.  Feeling an overall lack of motivation or enjoyment of activities that you previously enjoyed.  Frequent crying spells. Talk with your health care provider if you think that you are experiencing depression. What should I know about immunizations? It is important that you get and maintain your immunizations. These include:  Tetanus, diphtheria, and pertussis (Tdap) booster vaccine.  Influenza every year before the flu season begins.  Pneumonia vaccine.  Shingles vaccine. Your health care provider may also recommend other immunizations. This information is not intended to replace advice given to you by your health care provider. Make  sure you discuss any questions you have with your health care provider. Document Released: 04/22/2005 Document Revised: 09/18/2015 Document Reviewed: 12/02/2014 Elsevier Interactive Patient Education  2019 [employer].

## 2018-08-31 NOTE — Patient Instructions (Addendum)
  Ms. Burr , Thank you for taking time to come for your Medicare Wellness Visit. I appreciate your ongoing commitment to your health goals. Please review the following plan we discussed and let me know if I can assist you in the future.   These are the goals we discussed: Goals      Patient Stated   . Weight (lb) < 164 lb (74.4 kg) (pt-stated)     Lose 10lbs Reduce sugar/cookie intake       This is a list of the screening recommended for you and due dates:  Health Maintenance  Topic Date Due  . Flu Shot  10/13/2018  . Mammogram  02/13/2020  . Tetanus Vaccine  03/14/2021  . Colon Cancer Screening  03/14/2024  . DEXA scan (bone density measurement)  Completed  .  Hepatitis C: One time screening is recommended by Center for Disease Control  (CDC) for  adults born from [birth year] through 1965.   Completed  . Pneumonia vaccines  Completed

## 2018-08-31 NOTE — Assessment & Plan Note (Signed)
Saw Dr Holley Raring 2019 ( unable to see these notes)   no further follow up required per patient.

## 2018-08-31 NOTE — Progress Notes (Signed)
Subjective:   Kelly Mata is a 68 y.o. female who presents for Medicare Annual (Subsequent) preventive examination.  Review of Systems:  No ROS.  Medicare Wellness Virtual Visit.  Visual/audio telehealth visit, UTA vital signs.   See social history for additional risk factors.   Cardiac Risk Factors include: advanced age (>35men, >12 women);hypertension.     Objective:     Vitals: There were no vitals taken for this visit.  There is no height or weight on file to calculate BMI.  Advanced Directives 08/31/2018 08/30/2017 07/11/2017 07/04/2017 06/22/2017  Does Patient Have a Medical Advance Directive? Yes Yes No No No  Type of Paramedic of [place];Living will [place];Living will - - -  Does patient want to make changes to medical advance directive? No - Patient declined No - Patient declined - - -  Copy of Stem in Chart? No - copy requested No - copy requested - - -    Tobacco Social History   Tobacco Use  Smoking Status Never Smoker  Smokeless Tobacco Never Used     Counseling given: Not Answered   Clinical Intake:  Pre-visit preparation completed: Yes        Diabetes: No  How often do you need to have someone help you when you read instructions, pamphlets, or other written materials from your doctor or pharmacy?: 1 - Never  Interpreter Needed?: No     Past Medical History:  Diagnosis Date  . Allergy   . Breast cancer ([place]) 2009   neg  . Cancer [facility]) 2009   left breast DCIS, 5 yrs tamoxifen  . Headache   . Heart murmur 2008  . Personal history of radiation therapy   . Squamous cell carcinoma    Past Surgical History:  Procedure Laterality Date  . ABDOMINAL HYSTERECTOMY  1998   complete   . BREAST BIOPSY Left 2009   stereo breast biopsy, +  . BREAST CYST ASPIRATION  90s  . BREAST EXCISIONAL BIOPSY Left 2008   neg  . BREAST EXCISIONAL BIOPSY Left 1998  . BREAST LUMPECTOMY  Left 2009   radiation  . COLONOSCOPY  2013  . OOPHORECTOMY    . TONSILLECTOMY  1957   Family History  Problem Relation Age of Onset  . Cancer Other        cousin, breast  . Cancer Father        pancreatic (died at age 31)  . Breast cancer Cousin 17  . Cancer Mother        skin - unsure of type but thinks it was melanoma (died at age 25)  . Cancer Maternal Aunt    Social History   Socioeconomic History  . Marital status: Married    Spouse name: Not on file  . Number of children: 2  . Years of education: 16  . Highest education level: Bachelor's degree (e.g., BA, AB, BS)  Occupational History  . Occupation: Retired Corporate treasurer  Social Needs  . Financial resource strain: Not hard at all  . Food insecurity    Worry: Never true    Inability: Never true  . Transportation needs    Medical: No    Non-medical: No  Tobacco Use  . Smoking status: Never Smoker  . Smokeless tobacco: Never Used  Substance and Sexual Activity  . Alcohol use: Yes    Comment: 5 glasses of wine  . Drug use: No  . Sexual activity:  Not on file  Lifestyle  . Physical activity    Days per week: 0 days    Minutes per session: Not on file  . Stress: Not at all  Relationships  . Social Herbalist on phone: Not on file    Gets together: Not on file    Attends religious service: Not on file    Active member of club or organization: Not on file    Attends meetings of clubs or organizations: Not on file    Relationship status: Not on file  Other Topics Concern  . Not on file  Social History Narrative   Corporate treasurer   Retired      Married    2 children      Right-handed.   Caffeine use:  1 cup per day.    Outpatient Encounter Medications as of 08/31/2018  Medication Sig  . ibuprofen (ADVIL,MOTRIN) 200 MG tablet Take 200 mg by mouth every 6 (six) hours as needed.  . [DISCONTINUED] nitrofurantoin, macrocrystal-monohydrate, (MACROBID) 100 MG capsule Take 1 capsule (100 mg  total) by mouth 2 (two) times daily. Take with food.   No facility-administered encounter medications on file as of 08/31/2018.     Activities of Daily Living In your present state of health, do you have any difficulty performing the following activities: 08/31/2018  Hearing? N  Vision? N  Difficulty concentrating or making decisions? N  Walking or climbing stairs? N  Dressing or bathing? N  Doing errands, shopping? N  Preparing Food and eating ? N  Using the Toilet? N  In the past six months, have you accidently leaked urine? N  Do you have problems with loss of bowel control? N  Managing your Medications? N  Managing your Finances? N  Housekeeping or managing your Housekeeping? N  Some recent data might be hidden    Patient Care Team: Burnard Hawthorne, FNP as PCP - General (Family Medicine) Christene Lye, MD (General Surgery) Ammie Dalton, Okey Regal, MD (Unknown Physician Specialty)    Assessment:   This is a routine wellness examination for Kelly Mata.   I connected with patient 08/31/18 at  8:30 AM EDT by a video/audio enabled telemedicine application and verified that I am speaking with the correct person using two identifiers. Patient stated full name and DOB. Patient gave permission to continue with virtual visit. Patient's location was at home and Nurse's location was at [place] office.   Health Screenings  Mammogram - 02/2018 Colonoscopy - 03/2014 Bone Density - 09/2016 Glaucoma -none Hearing -demonstrates normal hearing during visit. Hemoglobin A1C - 08/2017 Cholesterol - 08/2017 Dental- every 6 months Vision- visits within the last 12 months.  Social  Alcohol intake - yes, 5 glasses of wine per week    Smoking history- never    Smokers in home? none Illicit drug use? none Exercise - none Diet - regular BMI- discussed the importance of a healthy diet, water intake and the benefits of aerobic exercise.  Educational material provided.   Safety  Patient  feels safe at home- yes Patient does have smoke detectors at home- yes Patient does wear sunscreen or protective clothing when in direct sunlight -yes Patient does wear seat belt when in a moving vehicle -yes Patient drives- yes  [id number] precautions and sickness symptoms discussed.   Activities of Daily Living Patient denies needing assistance with: driving, household chores, feeding themselves, getting from bed to chair, getting to the toilet, bathing/showering, dressing, managing money, or  preparing meals.  No new identified risk were noted.    Depression Screen Patient denies losing interest in daily life, feeling hopeless, or crying easily over simple problems.   Medication-taking as directed and without issues.   Fall Screen Patient denies being afraid of falling or falling in the last year.   Memory Screen Patient is alert.  Patient denies difficulty focusing, concentrating or misplacing items. Correctly identified the president of the [place], season and recall. Patient likes to read, plays computer games, and complete crossword puzzles for brain stimulation.  Immunizations The following Immunizations were discussed: Influenza, shingles, pneumonia, and tetanus.   Other Providers Patient Care Team: Burnard Hawthorne, FNP as PCP - General (Family Medicine) Christene Lye, MD (General Surgery) Ammie Dalton, Okey Regal, MD (Unknown Physician Specialty)  Exercise Activities and Dietary recommendations    Goals      Patient Stated   . Weight (lb) < 164 lb (74.4 kg) (pt-stated)     Lose 10lbs Reduce sugar/cookie intake       Fall Risk Fall Risk  08/31/2018 08/30/2017 06/23/2016 06/23/2016  Falls in the past year? 0 No No No   Depression Screen PHQ 2/9 Scores 08/31/2018 08/30/2017 08/21/2017 06/23/2016  PHQ - 2 Score 0 0 0 0  PHQ- 9 Score - - 0 -     Cognitive Function     6CIT Screen 08/31/2018 08/30/2017  What Year? 0 points 0 points  What month? 0 points 0  points  What time? 0 points 0 points  Count back from 20 0 points 0 points  Months in reverse 0 points 0 points  Repeat phrase - 0 points  Total Score - 0    Immunization History  Administered Date(s) Administered  . Influenza-Unspecified 01/10/2017  . Pneumococcal Conjugate-13 06/30/2016  . Pneumococcal Polysaccharide-23 08/30/2017  . Td 08/13/2007   Screening Tests Health Maintenance  Topic Date Due  . INFLUENZA VACCINE  10/13/2018  . MAMMOGRAM  02/13/2020  . TETANUS/TDAP  03/14/2021  . COLONOSCOPY  03/14/2024  . DEXA SCAN  Completed  . Hepatitis C Screening  Completed  . PNA vac Low Risk Adult  Completed    Plan:    End of life planning; Advance aging; Advanced directives discussed.  Copy of current HCPOA/Living Will requested.    I have personally reviewed and noted the following in the patient's chart:   . Medical and social history . Use of alcohol, tobacco or illicit drugs  . Current medications and supplements . Functional ability and status . Nutritional status . Physical activity . Advanced directives . List of other physicians . Hospitalizations, surgeries, and ER visits in previous 12 months . Vitals . Screenings to include cognitive, depression, and falls . Referrals and appointments  In addition, I have reviewed and discussed with patient certain preventive protocols, quality metrics, and best practice recommendations. A written personalized care plan for preventive services as well as general preventive health recommendations were provided to patient.     Varney Biles, LPN  7/34/1937   Agree with plan. Mable Paris, NP

## 2018-08-31 NOTE — Assessment & Plan Note (Signed)
Chronic.  Discussed conservative therapy including icing regimen particularly after long walks.  She will try over-the-counter Voltaren gel.  She declines consult with orthopedics or physical therapy at this time.  She will let me know

## 2018-08-31 NOTE — Progress Notes (Signed)
Subjective:    Patient ID: Kelly Mata, female    DOB: Aug 14, 1950, 68 y.o.   MRN: [id number]  CC: Kelly Mata is a 68 y.o. female who presents today for physical exam.    HPI: HPI  Overall feels well today.   Does complain of continued left knee pain. Worse after long walks. She has been walking more of late.  Unchanged. Using doterra oil with some relief.   Has seen [facility] 12/2016.   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   H/o left breast cancer  Colorectal Cancer Screening: UTD ; due 2026 Breast Cancer Screening: Mammogram UTD Cervical Cancer Screening: h/o hysterectomy for fibroids. Bone Health screening/DEXA for 65+:Due;  Lung Cancer Screening: Doesn't have 30 year pack year history and age > 89 years.       Tetanus - utd         Labs: Screening labs today. Exercise: Gets regular exercise.  Alcohol use: occasional Smoking/tobacco use: Nonsmoker.  Regular dental exams: UTD Wears seat belt: Yes. H/o SCC; follows with Darrick Huntsman.  HISTORY:  Past Medical History:  Diagnosis Date  . Allergy   . Breast cancer ([place]) 2009   neg  . Cancer [facility]) 2009   left breast DCIS, 5 yrs tamoxifen  . Headache   . Heart murmur 2008  . Personal history of radiation therapy   . Squamous cell carcinoma     Past Surgical History:  Procedure Laterality Date  . ABDOMINAL HYSTERECTOMY  1998   complete   . BREAST BIOPSY Left 2009   stereo breast biopsy, +  . BREAST CYST ASPIRATION  90s  . BREAST EXCISIONAL BIOPSY Left 2008   neg  . BREAST EXCISIONAL BIOPSY Left 1998  . BREAST LUMPECTOMY Left 2009   radiation  . COLONOSCOPY  2013  . OOPHORECTOMY    . TONSILLECTOMY  1957   Family History  Problem Relation Age of Onset  . Cancer Other        cousin, breast  . Cancer Father        pancreatic (died at age 53)  . Breast cancer Cousin 40  . Cancer Mother        skin - unsure of type but  thinks it was melanoma (died at age 3)  . Cancer Maternal Aunt       ALLERGIES: Ceclor [cefaclor] and Ampicillin  Current Outpatient Medications on File Prior to Visit  Medication Sig Dispense Refill  . ibuprofen (ADVIL,MOTRIN) 200 MG tablet Take 200 mg by mouth every 6 (six) hours as needed.     No current facility-administered medications on file prior to visit.     Social History   Tobacco Use  . Smoking status: Never Smoker  . Smokeless tobacco: Never Used  Substance Use Topics  . Alcohol use: Yes    Comment: 5 glasses of wine  . Drug use: No    Review of Systems  Constitutional: Negative for chills, fever and unexpected weight change.  HENT: Negative for congestion.   Respiratory: Negative for cough.   Cardiovascular: Negative for chest pain, palpitations and leg swelling.  Gastrointestinal: Negative for nausea and vomiting.  Musculoskeletal: Positive for arthralgias (left knee). Negative for myalgias.  Skin: Negative for rash.  Neurological: Negative for headaches.  Hematological: Negative for adenopathy.  Psychiatric/Behavioral: Negative for confusion.      Objective:    BP 122/76   Pulse  72   Temp 98 F (36.7 C)   Ht 5\' 1"  (1.549 m)   Wt 160 lb 6.4 oz (72.8 kg)   SpO2 94%   BMI 30.31 kg/m   BP Readings from Last 3 Encounters:  08/31/18 122/76  04/04/18 122/82  02/23/18 126/82   Wt Readings from Last 3 Encounters:  08/31/18 160 lb 6.4 oz (72.8 kg)  04/04/18 164 lb 6.4 oz (74.6 kg)  02/23/18 165 lb (74.8 kg)    Physical Exam Vitals signs reviewed.  Constitutional:      Appearance: She is well-developed.  Eyes:     Conjunctiva/sclera: Conjunctivae normal.  Neck:     Thyroid: No thyroid mass or thyromegaly.  Cardiovascular:     Rate and Rhythm: Normal rate and regular rhythm.     Pulses: Normal pulses.     Heart sounds: Normal heart sounds.  Pulmonary:     Effort: Pulmonary effort is normal.     Breath sounds: Normal breath sounds. No  wheezing, rhonchi or rales.  Chest:     Breasts: Breasts are symmetrical.        Right: No inverted nipple, mass, nipple discharge, skin change or tenderness.        Left: No inverted nipple, mass, nipple discharge, skin change or tenderness.  Musculoskeletal:     Left knee: She exhibits normal range of motion, no swelling and no erythema. No tenderness found. No medial joint line and no lateral joint line tenderness noted.     Comments: Bilateral knees are symmetric. No effusion appreciated. No increase in warmth or erythema. Crepitus felt with flexion of bilateral knees.  Right knee:  Able to extend to -5 to 10 degrees and flex to 110 degrees. No catching with McMurray maneuver. No patellar apprehension. Negative anterior drawer and lachman's- no laxity appreciated.  No calf tenderness of lower leg edema bilaterally.    Lymphadenopathy:     Head:     Right side of head: No submental, submandibular, tonsillar, preauricular, posterior auricular or occipital adenopathy.     Left side of head: No submental, submandibular, tonsillar, preauricular, posterior auricular or occipital adenopathy.     Cervical: No cervical adenopathy.     Right cervical: No superficial, deep or posterior cervical adenopathy.    Left cervical: No superficial, deep or posterior cervical adenopathy.  Skin:    General: Skin is warm and dry.  Neurological:     Mental Status: She is alert.  Psychiatric:        Speech: Speech normal.        Behavior: Behavior normal.        Thought Content: Thought content normal.        Assessment & Plan:   Problem List Items Addressed This Visit      Genitourinary   Angiomyolipoma of kidney    Saw Dr Holley Raring 2019 ( unable to see these notes)   no further follow up required per patient.         Other   Routine physical examination - Primary    Clinical breast exam performed.  Patient politely declines pelvic exam as she has no complaints today and h/o hysterectomy. She  doesn't have cervix per patient.Declines DEXA due to COVID and will discuss again next year.    Note: We discussed relevant and past history of abnormal labs today at length, we decided to order appropriate labs for screening today based on this discussion. Patient was comfortable with this. Patient was advised if  any symptoms were to change after today's visit, to notify me as we may always order additional labs.       Relevant Orders   Comprehensive metabolic panel   Hemoglobin A1c   VITAMIN D 25 Hydroxy (Vit-D Deficiency, Fractures)   Lipid panel   Chronic pain of left knee    Chronic.  Discussed conservative therapy including icing regimen particularly after long walks.  She will try over-the-counter Voltaren gel.  She declines consult with orthopedics or physical therapy at this time.  She will let me know          I am having Kelly Mata. Sahm maintain her ibuprofen.   No orders of the defined types were placed in this encounter.   Return precautions given.   Risks, benefits, and alternatives of the medications and treatment plan prescribed today were discussed, and patient expressed understanding.   Education regarding symptom management and diagnosis given to patient on AVS.   Continue to follow with Burnard Hawthorne, FNP for routine health maintenance.   Kelly Mata and I agreed with plan.   Mable Paris, FNP

## 2018-12-05 DIAGNOSIS — Z08 Encounter for follow-up examination after completed treatment for malignant neoplasm: Secondary | ICD-10-CM | POA: Diagnosis not present

## 2018-12-05 DIAGNOSIS — Z85828 Personal history of other malignant neoplasm of skin: Secondary | ICD-10-CM | POA: Diagnosis not present

## 2018-12-05 DIAGNOSIS — D225 Melanocytic nevi of trunk: Secondary | ICD-10-CM | POA: Diagnosis not present

## 2018-12-05 DIAGNOSIS — L820 Inflamed seborrheic keratosis: Secondary | ICD-10-CM | POA: Diagnosis not present

## 2018-12-05 DIAGNOSIS — L821 Other seborrheic keratosis: Secondary | ICD-10-CM | POA: Diagnosis not present

## 2018-12-05 DIAGNOSIS — L538 Other specified erythematous conditions: Secondary | ICD-10-CM | POA: Diagnosis not present

## 2019-01-11 ENCOUNTER — Encounter: Payer: Self-pay | Admitting: Family

## 2019-01-11 NOTE — Telephone Encounter (Signed)
Hard to determine over message  We can look in ear to see if it is wax, we can attemtp to flush here. Sometimes ENT is needed  She should use ear wax softening drops to prepare wax for flush to make it easier

## 2019-03-20 ENCOUNTER — Other Ambulatory Visit: Payer: Self-pay | Admitting: Family

## 2019-03-20 DIAGNOSIS — Z1231 Encounter for screening mammogram for malignant neoplasm of breast: Secondary | ICD-10-CM

## 2019-04-12 ENCOUNTER — Ambulatory Visit
Admission: RE | Admit: 2019-04-12 | Discharge: 2019-04-12 | Disposition: A | Discharge status: Home / Self Care | Payer: PPO | Source: Ambulatory Visit | Attending: Family | Admitting: Family

## 2019-04-12 DIAGNOSIS — Z1231 Encounter for screening mammogram for malignant neoplasm of breast: Secondary | ICD-10-CM | POA: Insufficient documentation

## 2019-04-16 ENCOUNTER — Telehealth: Payer: Self-pay | Admitting: Family

## 2019-04-16 ENCOUNTER — Emergency Department: Payer: PPO

## 2019-04-16 ENCOUNTER — Emergency Department
Admission: EM | Admit: 2019-04-16 | Discharge: 2019-04-16 | Disposition: A | Discharge status: Home / Self Care | Payer: PPO | Attending: Emergency Medicine | Admitting: Emergency Medicine

## 2019-04-16 ENCOUNTER — Other Ambulatory Visit: Payer: Self-pay

## 2019-04-16 DIAGNOSIS — I10 Essential (primary) hypertension: Secondary | ICD-10-CM | POA: Diagnosis not present

## 2019-04-16 DIAGNOSIS — R079 Chest pain, unspecified: Secondary | ICD-10-CM | POA: Diagnosis not present

## 2019-04-16 DIAGNOSIS — R519 Headache, unspecified: Secondary | ICD-10-CM | POA: Diagnosis not present

## 2019-04-16 LAB — BASIC METABOLIC PANEL
Anion gap: 11 (ref 5–15)
BUN: 16 mg/dL (ref 8–23)
CO2: 23 mmol/L (ref 22–32)
Calcium: 9.6 mg/dL (ref 8.9–10.3)
Chloride: 106 mmol/L (ref 98–111)
Creatinine, Ser: 0.84 mg/dL (ref 0.44–1.00)
GFR calc Af Amer: >60 mL/min (ref >60)
GFR calc non Af Amer: >60 mL/min (ref >60)
Glucose, Bld: 146 mg/dL — ABNORMAL HIGH (ref 70–99)
Potassium: 3.4 mmol/L — ABNORMAL LOW (ref 3.5–5.1)
Sodium: 140 mmol/L (ref 135–145)

## 2019-04-16 LAB — CBC
HCT: 40 % (ref 36.0–46.0)
Hemoglobin: 13.1 g/dL (ref 12.0–15.0)
MCH: 29.4 pg (ref 26.0–34.0)
MCHC: 32.8 g/dL (ref 30.0–36.0)
MCV: 89.7 fL (ref 80.0–100.0)
Platelets: 290 10*3/uL (ref 150–400)
RBC: 4.46 MIL/uL (ref 3.87–5.11)
RDW: 12.9 % (ref 11.5–15.5)
WBC: 9 10*3/uL (ref 4.0–10.5)
nRBC: 0 % (ref 0.0–0.2)

## 2019-04-16 LAB — TROPONIN I (HIGH SENSITIVITY): Troponin I (High Sensitivity): 3 ng/L (ref <18)

## 2019-04-16 MED ORDER — SODIUM CHLORIDE 0.9% FLUSH: 3.0000 mL | Freq: Once | INTRAVENOUS | Status: DC

## 2019-04-16 MED ORDER — IOHEXOL 350 MG/ML SOLN
75.0000 mL | Freq: Once | INTRAVENOUS | Status: AC | PRN
  Administered 2019-04-16: 75 mL via INTRAVENOUS

## 2019-04-16 NOTE — ED Provider Notes (Signed)
[facility] Emergency Department Provider Note   ____________________________________________    I have reviewed the triage vital signs and the nursing notes.   HISTORY  Chief Complaint Chest Pain     HPI Kelly Mata is a 69 y.o. female with history as noted below who presents with complaints of feeling flushed, high blood pressure and back pain and left arm pain now resolved.  Patient reports yesterday her blood pressure was elevated and this was concerning her.  In the middle of the night she woke up with a "hot feeling to the top of her head ".  She also had some pain in the center of her back as well which is now resolved.  Denies chest pain although did have some strange sensations in her left arm.  Currently is feeling well overall.  No history of high blood pressure.  No pleurisy or shortness of breath or fevers.  Past Medical History:  Diagnosis Date  . Allergy   . Breast cancer ([place]) 2009   neg  . Cancer [facility]) 2009   left breast DCIS, 5 yrs tamoxifen  . Headache   . Heart murmur 2008  . Personal history of radiation therapy   . Squamous cell carcinoma     Patient Active Problem List   Diagnosis Date Noted  . Dysuria 04/04/2018  . Diarrhea 02/23/2018  . Atherosclerosis of aorta ([place]) 08/21/2017  . Nonintractable headache 08/16/2017  . 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 Skull lesion 06/22/2017  . Intractable headache 06/19/2017  . Chronic pain of left knee 01/02/2017  . Routine physical examination 06/30/2016  . Tremor 06/23/2016  . Dizziness 06/23/2016    Past Surgical History:  Procedure Laterality Date  . ABDOMINAL HYSTERECTOMY  1998   complete   . BREAST BIOPSY Left 2009   stereo breast biopsy, +  . BREAST CYST ASPIRATION  90s  . BREAST EXCISIONAL BIOPSY Left 2008   neg  . BREAST EXCISIONAL BIOPSY Left 1998  . BREAST LUMPECTOMY Left 2009   radiation   . COLONOSCOPY  2013  . OOPHORECTOMY    . TONSILLECTOMY  1957    Prior to Admission medications   Medication Sig Start Date End Date Taking? Authorizing Provider  ibuprofen (ADVIL,MOTRIN) 200 MG tablet Take 200 mg by mouth every 6 (six) hours as needed.    [provider]     Allergies Ceclor [cefaclor] and Ampicillin  Family History  Problem Relation Age of Onset  . Cancer Other        cousin, breast  . Cancer Father        pancreatic (died at age 46)  . Breast cancer Cousin 8  . Cancer Mother        skin - unsure of type but thinks it was melanoma (died at age 63)  . Cancer Maternal Aunt     Social History Social History   Tobacco Use  . Smoking status: Never Smoker  . Smokeless tobacco: Never Used  Substance Use Topics  . Alcohol use: Yes    Comment: 5 glasses of wine  . Drug use: No    Review of Systems  Constitutional: No fever/chills Eyes: No visual changes.  ENT: No sore throat. Cardiovascular: As above Respiratory: As above. Gastrointestinal: No abdominal pain.   Genitourinary: Negative for dysuria. Musculoskeletal: As above Skin: Negative for rash. Neurological: Negative for headaches or weakness   ____________________________________________  PHYSICAL EXAM:  VITAL SIGNS: ED Triage Vitals  Enc Vitals Group     BP 04/16/19 0907 (!) 183/89     Pulse Rate 04/16/19 0907 (!) 130     Resp 04/16/19 0907 19     Temp 04/16/19 0907 98.3 F (36.8 C)     Temp Source 04/16/19 0907 Oral     SpO2 04/16/19 0907 99 %     Weight 04/16/19 0907 72.6 kg (160 lb)     Height 04/16/19 0907 1.549 m (5\' 1" )     Head Circumference --      Peak Flow --      Pain Score 04/16/19 0921 2     Pain Loc --      Pain Edu? --      Excl. in [place]? --     Constitutional: Alert and oriented.  Eyes: Conjunctivae are normal.   Mouth/Throat: Mucous membranes are moist.    Cardiovascular: Normal rate, regular rhythm Good peripheral circulation. Respiratory:  Normal respiratory effort.  No retractions Gastrointestinal: Soft and nontender. No distention.   Musculoskeletal: No lower extremity tenderness nor edema.  Warm and well perfused Neurologic:  Normal speech and language. No gross focal neurologic deficits are appreciated.  Skin:  Skin is warm, dry and intact. No rash noted. Psychiatric: Mood and affect are normal. Speech and behavior are normal.  ____________________________________________   LABS (all labs ordered are listed, but only abnormal results are displayed)  Labs Reviewed  BASIC METABOLIC PANEL - Abnormal; Notable for the following components:      Result Value   Potassium 3.4 (*)    Glucose, Bld 146 (*)    All other components within normal limits  CBC  TROPONIN I (HIGH SENSITIVITY)   ____________________________________________  EKG  ED ECG REPORT I, Lavonia Drafts, the attending physician, personally viewed and interpreted this ECG.  Date: 04/16/2019  Rhythm: normal sinus rhythm QRS Axis: normal Intervals: normal ST/T Wave abnormalities: normal Narrative Interpretation: no evidence of acute ischemia  ____________________________________________  RADIOLOGY  Chest x-ray unremarkable CT angiography negative for dissection, discussed need for liver protocol MRI for possible hemangioma noted incidentally ____________________________________________   PROCEDURES  Procedure(s) performed: No  Procedures   Critical Care performed: No ____________________________________________   INITIAL IMPRESSION / ASSESSMENT AND PLAN / ED COURSE  Pertinent labs & imaging results that were available during my care of the patient were reviewed by me and considered in my medical decision making (see chart for details).  Patient presents with elevated blood pressure which is atypical for her, complains of abnormal pain in her back which has now resolved.  Lab work including cardiac enzymes are quite reassuring.  Chest  x-ray is unremarkable.  Sent for CT angiography to rule out dissection given complaints of back pain and high blood pressure.  CT angiography was negative for dissection.  Blood pressure improved without intervention.  Patient is appropriate for discharge with outpatient follow-up, return precautions discussed    ____________________________________________   FINAL CLINICAL IMPRESSION(S) / ED DIAGNOSES  Final diagnoses:  Hypertension, unspecified type  Acute nonintractable headache, unspecified headache type        Note:  This document was prepared using Dragon voice recognition software and may include unintentional dictation errors.   Lavonia Drafts, MD 04/16/19 1540

## 2019-04-16 NOTE — Telephone Encounter (Signed)
I spoke with patient to triage & she said that she did not feel right this morning. Her BP at highest was 203/98 at 2am & she has never had BP issues before. She said that yesterday she first checked because she had a HA. She stated that she had been having HA off & on kind if like a dull buzzing as well as ringing in her ears. She said that when she got up this morning that she noticed that her left arm was numb feeling, she had some jaw & back pain as well. When patient told me this I immediately advised that she go to ED ASAP to be evaluated. I stressed to her the importance of this because in clinic we did not have resources to evaluate her properly. I told patient that we would continue to check to make sure that she headed to ED to be checked out. Pt was agreeable to going.

## 2019-04-16 NOTE — ED Triage Notes (Signed)
Pt c/o feeling light headed and states her b/p 203/98 at home with no hx of HTN in the past. States she had chest and arm pain this morning but denies them at this time.

## 2019-04-16 NOTE — Telephone Encounter (Signed)
BP has been spiking and not going down. Patient over the last few months has been having headaches. No appts are available at this time. BP last reading was 166/83 at 6am today.

## 2019-04-16 NOTE — Telephone Encounter (Signed)
In ED

## 2019-04-17 ENCOUNTER — Telehealth: Payer: Self-pay | Admitting: Family

## 2019-04-17 NOTE — Telephone Encounter (Signed)
Call pt so we can sch ed follow up so we can discuss BP and review findings in CT angio done in ED

## 2019-04-18 NOTE — Telephone Encounter (Signed)
I called patient & scheduled her first available 05/06/19. I asked that if she had any questions or concerns prior to give [place] a call.

## 2019-04-19 NOTE — Telephone Encounter (Signed)
noted 

## 2019-04-26 ENCOUNTER — Other Ambulatory Visit: Payer: Self-pay

## 2019-04-26 ENCOUNTER — Ambulatory Visit (INDEPENDENT_AMBULATORY_CARE_PROVIDER_SITE_OTHER): Payer: PPO | Admitting: Family

## 2019-04-26 VITALS — BP 164/92 | HR 107 | Temp 96.4°F | Resp 18 | Ht 60.5 in | Wt 156.8 lb

## 2019-04-26 DIAGNOSIS — I10 Essential (primary) hypertension: Secondary | ICD-10-CM | POA: Insufficient documentation

## 2019-04-26 DIAGNOSIS — K769 Liver disease, unspecified: Secondary | ICD-10-CM

## 2019-04-26 DIAGNOSIS — I159 Secondary hypertension, unspecified: Secondary | ICD-10-CM

## 2019-04-26 MED ORDER — AMLODIPINE BESYLATE 5 MG PO TABS: 5.0000 mg | ORAL_TABLET | Freq: Every day | ORAL | 3 refills | Status: DC

## 2019-04-26 NOTE — Patient Instructions (Signed)
Start amlodipine  Monitor blood pressure,  Goal is less than 120/80, based on newest guidelines; if persistently higher, please make sooner follow up appointment so we can recheck you blood pressure and manage medications  Let me know how you are doing

## 2019-04-26 NOTE — Assessment & Plan Note (Addendum)
New. Advised MRI liver for further evaluation and to ensure benign. Patient declines at this time.   she would like to address this at another time, and I have asked her to bring this to my attention when she sees me at follow up.   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  No follow-up.

## 2019-04-26 NOTE — Assessment & Plan Note (Addendum)
Uncontrolled however iImproved as she sat in room and I retook BP. Will start amlodipine and have close follow up.

## 2019-04-26 NOTE — Progress Notes (Signed)
Subjective:    Patient ID: Kelly Mata, female    DOB: 04/01/1950, 69 y.o.   MRN: [id number]  CC: Kelly Mata is a 69 y.o. female who presents today for follow up.   HPI: Presents today to follow-up elevated blood pressure.    She is been checking her blood pressure at home and states this morning it was 145/85.  She has decreased caffeine, salt and sugar for the past week and has subsequently lost 6 pounds.  She does endorse that over the past 3 years she has gained about 6 pounds.  She feels that she has had more food indiscretion in the latter part of last year and over the holidays. She walks 30 minutes to an hour 3 times a week. No nsaids.   Denies chest pain, shortness of breath, vision changes, tinnitus. Left   Arm pain has resolved.  She does continue to have posterior headache which comes and goes.  She attributes this to elevated blood pressure as the headache is present when her blood pressure is elevated; it resolved at other times ED 04/16/19 for feeling flushed, left arm pain and high blood pressure. BP 183/89, HR 130. K 3.4. troponin negative x 1.  CT angio performed showed calcified granulomas noted in the spleen.  Peripheral enhancing structure in the posterior right hepatic lobe.    HISTORY:  Past Medical History:  Diagnosis Date  . Allergy   . Breast cancer ([place]) 2009   neg  . Cancer [facility]) 2009   left breast DCIS, 5 yrs tamoxifen  . Headache   . Heart murmur 2008  . Personal history of radiation therapy   . Squamous cell carcinoma    Past Surgical History:  Procedure Laterality Date  . ABDOMINAL HYSTERECTOMY  1998   complete   . BREAST BIOPSY Left 2009   stereo breast biopsy, +  . BREAST CYST ASPIRATION  90s  . BREAST EXCISIONAL BIOPSY Left 2008   neg  . BREAST EXCISIONAL BIOPSY Left 1998  . BREAST LUMPECTOMY Left 2009   radiation  . COLONOSCOPY  2013  . OOPHORECTOMY    . TONSILLECTOMY  1957   Family History  Problem Relation Age of Onset    . Cancer Other        cousin, breast  . Cancer Father        pancreatic (died at age 14)  . Breast cancer Cousin 80  . Cancer Mother        skin - unsure of type but thinks it was melanoma (died at age 34)  . Cancer Maternal Aunt     Allergies: Ceclor [cefaclor] and Ampicillin Current Outpatient Medications on File Prior to Visit  Medication Sig Dispense Refill  . ibuprofen (ADVIL,MOTRIN) 200 MG tablet Take 200 mg by mouth every 6 (six) hours as needed.     No current facility-administered medications on file prior to visit.    Social History   Tobacco Use  . Smoking status: Never Smoker  . Smokeless tobacco: Never Used  Substance Use Topics  . Alcohol use: Yes    Comment: 5 glasses of wine  . Drug use: No    Review of Systems  Constitutional: Negative for chills, fever and unexpected weight change.  HENT: Negative for congestion.   Respiratory: Negative for cough.   Cardiovascular: Negative for chest pain, palpitations and leg swelling.  Gastrointestinal: Negative for nausea and vomiting.  Musculoskeletal: Negative for arthralgias and myalgias.  Skin: Negative for  rash.  Neurological: Positive for headaches.  Hematological: Negative for adenopathy.  Psychiatric/Behavioral: Negative for confusion.      Objective:    BP (!) 164/92   Pulse (!) 107   Temp (!) 96.4 F (35.8 C) (Temporal)   Resp 18   Ht 5' 0.5" (1.537 m)   Wt 156 lb 12.8 oz (71.1 kg)   SpO2 96%   BMI 30.12 kg/m  BP Readings from Last 3 Encounters:  04/26/19 (!) 164/92  04/16/19 139/65  08/31/18 122/76   Wt Readings from Last 3 Encounters:  04/26/19 156 lb 12.8 oz (71.1 kg)  04/16/19 160 lb (72.6 kg)  08/31/18 160 lb 6.4 oz (72.8 kg)    Physical Exam Vitals reviewed.  Constitutional:      Appearance: She is well-developed.  HENT:     Head: Normocephalic and atraumatic.     Right Ear: Hearing, tympanic membrane, ear canal and external ear normal. No swelling or tenderness. No middle  ear effusion. Tympanic membrane is not erythematous or bulging.     Left Ear: Tympanic membrane, ear canal and external ear normal. No swelling or tenderness.  No middle ear effusion. Tympanic membrane is not erythematous or bulging.     Nose: Nose normal. No rhinorrhea.     Right Sinus: No maxillary sinus tenderness or frontal sinus tenderness.     Left Sinus: No maxillary sinus tenderness or frontal sinus tenderness.     Mouth/Throat:     Pharynx: Uvula midline. No posterior oropharyngeal erythema.  Eyes:     General: Lids are normal. Lids are everted, no foreign bodies appreciated.     Conjunctiva/sclera: Conjunctivae normal.     Pupils: Pupils are equal, round, and reactive to light.     Comments: Normal fundus bilaterally   Cardiovascular:     Rate and Rhythm: Normal rate and regular rhythm.     Pulses: Normal pulses.     Heart sounds: Normal heart sounds.  Pulmonary:     Effort: Pulmonary effort is normal.     Breath sounds: Normal breath sounds. No wheezing, rhonchi or rales.  Lymphadenopathy:     Head:     Right side of head: No submental, submandibular, tonsillar, preauricular, posterior auricular or occipital adenopathy.     Left side of head: No submental, submandibular, tonsillar, preauricular, posterior auricular or occipital adenopathy.     Cervical: No cervical adenopathy.     Right cervical: No superficial, deep or posterior cervical adenopathy.    Left cervical: No superficial, deep or posterior cervical adenopathy.  Skin:    General: Skin is warm and dry.  Neurological:     Mental Status: She is alert.     Cranial Nerves: No cranial nerve deficit.     Sensory: No sensory deficit.     Deep Tendon Reflexes:     Reflex Scores:      Bicep reflexes are 2+ on the right side and 2+ on the left side.      Patellar reflexes are 2+ on the right side and 2+ on the left side.    Comments: Grip equal and strong bilateral upper extremities. Gait strong and steady. Able to  perform  finger-to-nose without difficulty.   Psychiatric:        Speech: Speech normal.        Behavior: Behavior normal.        Thought Content: Thought content normal.        Assessment & Plan:   Problem List  Items Addressed This Visit      Cardiovascular and Mediastinum   HTN (hypertension) - Primary    Uncontrolled however iImproved as she sat in room and I retook BP. Will start amlodipine and have close follow up.       Relevant Medications   amLODipine (NORVASC) 5 MG tablet     Other   Liver lesion    New. Advised MRI liver for further evaluation and to ensure benign. Patient declines at this time.   she would like to address this at another time, and I have asked her to bring this to my attention when she sees me at follow up.   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  No follow-up.          I am having Kelly Mata start on amLODipine. I am also having her maintain her ibuprofen.   Meds ordered this encounter  Medications  . amLODipine (NORVASC) 5 MG tablet    Sig: Take 1 tablet (5 mg total) by mouth daily.    Dispense:  90 tablet    Refill:  3    Order Specific Question:   Supervising Provider    Answer:   Crecencio Mc [[id number]]    Return precautions given.   Risks, benefits, and alternatives of the medications and treatment plan prescribed today were discussed, and patient expressed understanding.   Education regarding symptom management and diagnosis given to patient on AVS.  Continue to follow with Burnard Hawthorne, FNP for routine health maintenance.   Kelly Mata and I agreed with plan.   Mable Paris, FNP

## 2019-04-29 ENCOUNTER — Encounter: Payer: Self-pay | Admitting: Family

## 2019-05-02 ENCOUNTER — Encounter: Payer: Self-pay | Admitting: Family

## 2019-05-02 ENCOUNTER — Other Ambulatory Visit: Payer: Self-pay | Admitting: Family

## 2019-05-02 DIAGNOSIS — K769 Liver disease, unspecified: Secondary | ICD-10-CM

## 2019-05-02 NOTE — Progress Notes (Signed)
I spoke with patient & she would like to pursue MRI for liver lesion.

## 2019-05-08 ENCOUNTER — Other Ambulatory Visit: Payer: Self-pay | Admitting: Family

## 2019-05-08 DIAGNOSIS — K769 Liver disease, unspecified: Secondary | ICD-10-CM

## 2019-05-09 NOTE — Progress Notes (Signed)
This has already been scheduled for 2/27 but patient confirmed NO metal in her body or pacemaker.

## 2019-05-11 ENCOUNTER — Ambulatory Visit
Admission: RE | Admit: 2019-05-11 | Discharge: 2019-05-11 | Disposition: A | Discharge status: Home / Self Care | Payer: PPO | Source: Ambulatory Visit | Attending: Family | Admitting: Family

## 2019-05-11 DIAGNOSIS — K769 Liver disease, unspecified: Secondary | ICD-10-CM | POA: Diagnosis not present

## 2019-05-11 DIAGNOSIS — D3002 Benign neoplasm of left kidney: Secondary | ICD-10-CM | POA: Diagnosis not present

## 2019-05-11 DIAGNOSIS — K7689 Other specified diseases of liver: Secondary | ICD-10-CM | POA: Diagnosis not present

## 2019-05-11 MED ORDER — GADOBUTROL 1 MMOL/ML IV SOLN
7.0000 mL | Freq: Once | INTRAVENOUS | Status: AC | PRN
  Administered 2019-05-11: 7 mL via INTRAVENOUS

## 2019-05-14 ENCOUNTER — Encounter: Payer: Self-pay | Admitting: Family

## 2019-05-14 ENCOUNTER — Other Ambulatory Visit: Payer: Self-pay

## 2019-05-14 ENCOUNTER — Ambulatory Visit (INDEPENDENT_AMBULATORY_CARE_PROVIDER_SITE_OTHER): Payer: PPO | Admitting: Family

## 2019-05-14 DIAGNOSIS — R519 Headache, unspecified: Secondary | ICD-10-CM | POA: Diagnosis not present

## 2019-05-14 DIAGNOSIS — I159 Secondary hypertension, unspecified: Secondary | ICD-10-CM | POA: Diagnosis not present

## 2019-05-14 DIAGNOSIS — D1771 Benign lipomatous neoplasm of kidney: Secondary | ICD-10-CM | POA: Diagnosis not present

## 2019-05-14 DIAGNOSIS — K769 Liver disease, unspecified: Secondary | ICD-10-CM

## 2019-05-14 NOTE — Patient Instructions (Addendum)
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

## 2019-05-14 NOTE — Progress Notes (Signed)
Subjective:    Patient ID: Kelly Mata, female    DOB: [birth year], 69 y.o.   MRN: [id number]  CC: Kelly Mata is a 69 y.o. female who presents today for follow up.   HPI: HTN-compliant with amlodipine.  No chest pain, shortness of breath. Has dull posterior HA, for past couple of years, unchanged. Not particularly bothersome and feels like noticed more often when recent fluctuating in blood pressure. Ibuprofen will resolve HA. No arm weakness, numbness.   She does report bilateral tinnitus in ears, decreased hearing left ear. NO ear pain, ear pressure.    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   Pet scan in 2019 didn't show  05/11/19 Angiomyolipoma - 1cm x 1cm  CT head no acute intracranial abnormalities.  XR c spine DDD.   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   HISTORY:  Past Medical History:  Diagnosis Date  . Allergy   . Breast cancer ([place]) 2009   neg  . Cancer [facility]) 2009   left breast DCIS, 5 yrs tamoxifen  . Headache   . Heart murmur 2008  . Personal history of radiation therapy   . Squamous cell carcinoma    Past Surgical History:  Procedure Laterality Date  . ABDOMINAL HYSTERECTOMY  1998   complete   . BREAST BIOPSY Left 2009   stereo breast biopsy, +  . BREAST CYST ASPIRATION  90s  . BREAST EXCISIONAL BIOPSY Left 2008   neg  . BREAST EXCISIONAL BIOPSY Left 1998  . BREAST LUMPECTOMY Left 2009   radiation  . COLONOSCOPY  2013  . OOPHORECTOMY    . TONSILLECTOMY  1957   Family History  Problem Relation Age of Onset  . Cancer Other        cousin, breast  . Cancer Father        pancreatic (died at age 66)  . Breast cancer Cousin 88  . Cancer Mother        skin - unsure of type but thinks it was melanoma (died at age 37)  . Cancer Maternal Aunt     Allergies: Ceclor [cefaclor] and Ampicillin Current Outpatient Medications on File Prior to Visit  Medication Sig Dispense Refill    . amLODipine (NORVASC) 5 MG tablet Take 1 tablet (5 mg total) by mouth daily. 90 tablet 3  . ibuprofen (ADVIL,MOTRIN) 200 MG tablet Take 200 mg by mouth every 6 (six) hours as needed.     No current facility-administered medications on file prior to visit.    Social History   Tobacco Use  . Smoking status: Never Smoker  . Smokeless tobacco: Never Used  Substance Use Topics  . Alcohol use: Yes    Comment: 5 glasses of wine  . Drug use: No    Review of Systems  Constitutional: Negative for chills and fever.  Respiratory: Negative for cough.   Cardiovascular: Negative for chest pain and palpitations.  Gastrointestinal: Negative for nausea and vomiting.  Neurological: Positive for headaches.      Objective:    BP 116/64   Pulse 69   Temp (!) 96.7 F (35.9 C) (Temporal)   Ht 5\' 1"  (1.549 m)   Wt 156 lb 9.6 oz (71 kg)   SpO2 98%   BMI 29.59 kg/m  BP Readings from Last 3 Encounters:  05/14/19 116/64  04/26/19 (!) 164/92  04/16/19 139/65   Wt Readings  from Last 3 Encounters:  05/14/19 156 lb 9.6 oz (71 kg)  04/26/19 156 lb 12.8 oz (71.1 kg)  04/16/19 160 lb (72.6 kg)    Physical Exam Vitals reviewed.  Constitutional:      Appearance: She is well-developed.  Eyes:     Conjunctiva/sclera: Conjunctivae normal.  Cardiovascular:     Rate and Rhythm: Normal rate and regular rhythm.     Pulses: Normal pulses.     Heart sounds: Normal heart sounds.  Pulmonary:     Effort: Pulmonary effort is normal.     Breath sounds: Normal breath sounds. No wheezing, rhonchi or rales.  Skin:    General: Skin is warm and dry.  Neurological:     Mental Status: She is alert.  Psychiatric:        Speech: Speech normal.        Behavior: Behavior normal.        Thought Content: Thought content normal.        Assessment & Plan:   Problem List Items Addressed This Visit      Cardiovascular and Mediastinum   HTN (hypertension)    Controlled. HA has improved. Patient declines  further work up of her HA at this time as not bothersome. She will  continue amlodipine and let me know of any concerns,particularly if HA continues or any features were to worsen.          Genitourinary   Angiomyolipoma of kidney    Stable. 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 04/2019 Discussed the great length with patient today.  Angiolipoma will require surveillance, she has completed 3 years thus far.  I have messaged   Dr Diamantina Providence , urology, in regards to this whom advised [place] for one to two more years. I will place referral for surveillance after speaking with patient.         Other   Intractable headache    Improved since start amlodipine however patient still does complain of posterior headache.  Discussed that this may be related to neck pain which patient felt to be likely.  She politely declines any imaging of her cervical spine, head at this time.  I did advise with her history of breast cancer, that is important.for surveillance due to concern for bone metastasis.  Patient is very aware of this.  She will monitor her headache and certainly if it were to increase or change in any way, she will let me know.      Liver lesion    Reviewed MRI in depth patient today.  After consulting with Dr. Allen Norris in regards to surveillance, I will place referral to him for surveillance of lesion after speaking with patient.           I am having [name]. Zody maintain her ibuprofen and amLODipine.   No orders of the defined types were placed in this encounter.   Return precautions given.   Risks, benefits, and alternatives of the medications and treatment plan prescribed today were discussed, and patient expressed understanding.   Education regarding symptom management and diagnosis given to patient on AVS.  Continue to follow with Burnard Hawthorne, FNP for routine health maintenance.   Kelly Mata and I agreed with plan.   Mable Paris, FNP

## 2019-05-14 NOTE — Assessment & Plan Note (Addendum)
Stable. 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 04/2019 Discussed the great length with patient today.  Angiolipoma will require surveillance, she has completed 3 years thus far.  I have messaged   Dr Diamantina Providence , urology, in regards to this whom advised [place] for one to two more years. I will place referral for surveillance after speaking with patient.

## 2019-05-14 NOTE — Assessment & Plan Note (Signed)
Improved since start amlodipine however patient still does complain of posterior headache.  Discussed that this may be related to neck pain which patient felt to be likely.  She politely declines any imaging of her cervical spine, head at this time.  I did advise with her history of breast cancer, that is important.for surveillance due to concern for bone metastasis.  Patient is very aware of this.  She will monitor her headache and certainly if it were to increase or change in any way, she will let me know.

## 2019-05-14 NOTE — Assessment & Plan Note (Addendum)
Controlled. HA has improved. Patient declines further work up of her HA at this time as not bothersome. She will  continue amlodipine and let me know of any concerns,particularly if HA continues or any features were to worsen.

## 2019-05-14 NOTE — Assessment & Plan Note (Addendum)
Reviewed MRI in depth patient today.  After consulting with Dr. Allen Norris in regards to surveillance, I will place referral to him for surveillance of lesion after speaking with patient.

## 2019-05-21 ENCOUNTER — Telehealth: Payer: Self-pay | Admitting: Family

## 2019-05-21 DIAGNOSIS — K769 Liver disease, unspecified: Secondary | ICD-10-CM

## 2019-05-21 DIAGNOSIS — D1771 Benign lipomatous neoplasm of kidney: Secondary | ICD-10-CM

## 2019-05-21 NOTE — Telephone Encounter (Signed)
I called and advised patient on below. She is willing to see GI & she said that she has already seen a urologist, so was unsure if she needed a referral.   She also said that pretty much every day she has a headache, but it is bearable. She said that she takes ibuprofen & it goes away. She said that it wasn't really a migraine, but that she did have those two years ago. These HA's have just started back this spring she said.   I offered patient f/u, but all she wanted to do was make sure that her physical was scheduled in June.

## 2019-05-21 NOTE — Telephone Encounter (Signed)
I called patient Kelly Mata explaining that referrals were placed & to whom they were for. I asked that she call back if she has not heard in the next week.

## 2019-05-21 NOTE — Telephone Encounter (Signed)
Call pt Please advise her that I have heard back from GI, Dr. Lucilla Lame.  I discussed surveillance of liver lesion, and he is more than happy to see her for this reason.  They can discuss appropriate modalities for screening the liver lesion and timelines for this as well.  I also received a nice note from urology, Dr. Diamantina Providence in regards to the angiolipoma that have been present for the last 3 years.  He advised another couple of years of surveillance, and is happy to see her in regards to this.  I know this is alot however she see both GI and urology to complete the surveillance of these incidental findings.  Please ensure she is agreeable, and I will place referrals.

## 2019-05-21 NOTE — Telephone Encounter (Signed)
Call patient Referral to GI, Dr. Allen Norris, in place.   Referral also to urology,  neNOTphrology whom she is in the past.  Referral to urology was made with Dr. Diamantina Providence   More appropriate for GI and UROLOGY to follow liver and kidney lesion.   Ask her to call us if she doesn't back in regards to these in a week

## 2019-05-21 NOTE — Telephone Encounter (Signed)
In addition to previous note, I meant to also stated below   Please advise patient that there is some literature in regards to migraine and ringing in the ears ( tinnitus).  If her headache and ringing the ears is persistent,please advise that she let us know so we can discuss further evaluation if needed.

## 2019-05-21 NOTE — Assessment & Plan Note (Signed)
Improved.  Unsure if headache is related to blood pressure solely or perhaps migrainous in that she has tinnitus associated with this.  Patient  declines further work-up at this time.  I advised her to stay vigilant and let me know if this symptom were to persist, certainly worsen.

## 2019-06-04 DIAGNOSIS — L821 Other seborrheic keratosis: Secondary | ICD-10-CM | POA: Diagnosis not present

## 2019-06-13 ENCOUNTER — Encounter: Payer: Self-pay | Admitting: Urology

## 2019-06-13 ENCOUNTER — Ambulatory Visit (INDEPENDENT_AMBULATORY_CARE_PROVIDER_SITE_OTHER): Payer: PPO | Admitting: Urology

## 2019-06-13 ENCOUNTER — Other Ambulatory Visit: Payer: Self-pay

## 2019-06-13 VITALS — BP 142/83 | HR 92 | Ht 61.0 in | Wt 160.0 lb

## 2019-06-13 DIAGNOSIS — R300 Dysuria: Secondary | ICD-10-CM | POA: Diagnosis not present

## 2019-06-13 DIAGNOSIS — N393 Stress incontinence (female) (male): Secondary | ICD-10-CM

## 2019-06-13 DIAGNOSIS — D1771 Benign lipomatous neoplasm of kidney: Secondary | ICD-10-CM

## 2019-06-13 NOTE — Progress Notes (Signed)
06/13/19 4:33 PM   Kelly Mata 04/02/1950 [id number]  CC: Left angiomyolipoma  HPI: I saw Kelly Mata in urology clinic for evaluation of a small left angiomyolipoma.  She is a 69 year old female with a medical history notable for breast cancer who was recently found to have a 1 cm left renal angiomyolipoma on MRI dated 05/11/2019.  On review of prior imaging this was present over a year ago and a renal ultrasound dated 05/09/2018 and measured 1.9 cm at that time.  She denies any flank pain or gross hematuria.  She has mild stress incontinence that she is minimally bothered by.   PMH: Past Medical History:  Diagnosis Date  . Allergy   . Breast cancer ([place]) 2009   neg  . Cancer [facility]) 2009   left breast DCIS, 5 yrs tamoxifen  . Headache   . Heart murmur 2008  . Personal history of radiation therapy   . Squamous cell carcinoma     Surgical History: Past Surgical History:  Procedure Laterality Date  . ABDOMINAL HYSTERECTOMY  1998   complete   . BREAST BIOPSY Left 2009   stereo breast biopsy, +  . BREAST CYST ASPIRATION  90s  . BREAST EXCISIONAL BIOPSY Left 2008   neg  . BREAST EXCISIONAL BIOPSY Left 1998  . BREAST LUMPECTOMY Left 2009   radiation  . COLONOSCOPY  2013  . OOPHORECTOMY    . TONSILLECTOMY  1957    Family History: Family History  Problem Relation Age of Onset  . Cancer Other        cousin, breast  . Cancer Father        pancreatic (died at age 79)  . Breast cancer Cousin 32  . Cancer Mother        skin - unsure of type but thinks it was melanoma (died at age 69)  . Cancer Maternal Aunt     Social History:  reports that she has never smoked. She has never used smokeless tobacco. She reports current alcohol use. She reports that she does not use drugs.  Physical Exam: BP (!) 142/83   Pulse 92   Ht '5\' 1"'  (1.549 m)   Wt 160 lb (72.6 kg)   BMI 30.23 kg/m    Constitutional:  Alert and oriented, No acute distress. Cardiovascular: No clubbing,  cyanosis, or edema. Respiratory: Normal respiratory effort, no increased work of breathing. GI: Abdomen is soft, nontender, nondistended, no abdominal masses GU: No CVA tenderness  Laboratory Data: Reviewed Normal renal function, creatinine 0.84, EGFR greater than 60  Pertinent Imaging: I have personally reviewed the renal ultrasound and MRI, 1 cm left angiomyolipoma  Assessment & Plan:   In summary, she is a 69 year old female with a 1 cm left angiomyolipoma that was originally seen on ultrasound over 1 year ago, and is stable on most recent MRI within the last month.  We had a long conversation about these lesions and that they are nonmalignant, but if they enlarge greater than 4 cm can be risk for bleeding.  We discussed return precautions at length including severe flank pain or gross hematuria.  We also discussed options for stress incontinence including Kegel exercises, pessary, or surgical options including sling.  She is minimally bothered by her chronic stress incontinence and not interested in any additional treatment options at this time.  RTC with renal ultrasound in 1 year for surveillance of left AML  Nickolas Madrid, MD 06/13/2019  [facility] [address]  [address], [place], [place] [address] [phone]

## 2019-06-27 ENCOUNTER — Encounter: Payer: Self-pay | Admitting: Family

## 2019-06-28 ENCOUNTER — Encounter: Payer: Self-pay | Admitting: Nurse Practitioner

## 2019-06-28 ENCOUNTER — Telehealth: Payer: PPO | Admitting: Nurse Practitioner

## 2019-06-28 ENCOUNTER — Encounter: Payer: Self-pay | Admitting: Family

## 2019-06-28 ENCOUNTER — Other Ambulatory Visit: Payer: Self-pay

## 2019-06-28 ENCOUNTER — Ambulatory Visit
Admission: RE | Admit: 2019-06-28 | Discharge: 2019-06-28 | Disposition: A | Discharge status: Home / Self Care | Payer: PPO | Source: Ambulatory Visit | Attending: Nurse Practitioner | Admitting: Nurse Practitioner

## 2019-06-28 VITALS — BP 111/69 | Ht 61.0 in | Wt 157.0 lb

## 2019-06-28 DIAGNOSIS — I159 Secondary hypertension, unspecified: Secondary | ICD-10-CM | POA: Diagnosis not present

## 2019-06-28 DIAGNOSIS — R600 Localized edema: Secondary | ICD-10-CM | POA: Insufficient documentation

## 2019-06-28 DIAGNOSIS — M79652 Pain in left thigh: Secondary | ICD-10-CM

## 2019-06-28 NOTE — Progress Notes (Signed)
Virtual Visit via Video Note  I connected with@ on 06/28/19 at 11:00 AM EDT by a video enabled telemedicine application and verified that I am speaking with the correct person using two identifiers. This visit type was conducted due to national recommendations for restrictions regarding the COVID-19 Pandemic (e.g. social distancing).  This format is felt to be most appropriate for this patient at this time.   I discussed the limitations of evaluation and management by telemedicine and the availability of in person appointments. The patient expressed understanding and agreed to proceed.  Only the patient and myself were on today's video visit. The patient was at home and I was in my office at the time of today's visit.   History of Present Illness:  This 69 yo with PMH of breast cancer 2009 , history of tamoxifen x 5 year, HTN, started a new blood pressure medicine about 6 weeks ago Norvasc 5 mg daily.  About 2 weeks ago or so,  she started to notice a dull heavy ache in her bilat  lower legs like she was wearing a tight stocking around her lower legs. Then her left leg became swollen ankle to  twice the normal size 8 in above the ankle. She had a posterior left thigh dull ache at the same time. Now, the left lower extremity is slightly swollen and she has a mild ache in left posterior thigh. She has pale skin color and sun freckles on lower legs. Less ankle swelling on right and no right thigh pain.  She wondered if this could be blood clot or SE from Norvasc or her second Covid vaccine 4 weeks ago.   She has been keeping her legs elevated when she is sitting, and it does help decrease the swelling.  Some days are worse than others.  She has been walking normally throughout the day.  She denies any chest pain, shortness of breath, DOE, fevers or chills.  No history of blood clot.  No cp, no sob, no fever chills. No rash anywhere else on the body and she is not certain that she even has a rash on the  lower ankles.  She says she is never really looked at her lower legs that closely before and she has pale skin with freckles.   Past Medical History:  Diagnosis Date  . Allergy   . Breast cancer ([place]) 2009   neg  . Cancer [facility]) 2009   left breast DCIS, 5 yrs tamoxifen  . Headache   . Heart murmur 2008  . Personal history of radiation therapy   . Squamous cell carcinoma      Social History   Socioeconomic History  . Marital status: Married    Spouse name: Not on file  . Number of children: 2  . Years of education: 16  . Highest education level: Bachelor's degree (e.g., BA, AB, BS)  Occupational History  . Occupation: Retired Corporate treasurer  Tobacco Use  . Smoking status: Never Smoker  . Smokeless tobacco: Never Used  Substance and Sexual Activity  . Alcohol use: Yes    Comment: 5 glasses of wine  . Drug use: No  . Sexual activity: Not on file  Other Topics Concern  . Not on file  Social History Narrative   Corporate treasurer   Retired      Married    2 children      Right-handed.   Caffeine use:  1 cup per day.   Social Determinants of Health  Financial Resource Strain:   . Difficulty of Paying Living Expenses:   Food Insecurity:   . Worried About Charity fundraiser in the Last Year:   . Arboriculturist in the Last Year:   Transportation Needs:   . Film/video editor (Medical):   Marland Kitchen Lack of Transportation (Non-Medical):   Physical Activity:   . Days of Exercise per Week:   . Minutes of Exercise per Session:   Stress:   . Feeling of Stress :   Social Connections:   . Frequency of Communication with Friends and Family:   . Frequency of Social Gatherings with Friends and Family:   . Attends Religious Services:   . Active Member of Clubs or Organizations:   . Attends Archivist Meetings:   Marland Kitchen Marital Status:   Intimate Partner Violence:   . Fear of Current or Ex-Partner:   . Emotionally Abused:   Marland Kitchen Physically Abused:   . Sexually Abused:      Past Surgical History:  Procedure Laterality Date  . ABDOMINAL HYSTERECTOMY  1998   complete   . BREAST BIOPSY Left 2009   stereo breast biopsy, +  . BREAST CYST ASPIRATION  90s  . BREAST EXCISIONAL BIOPSY Left 2008   neg  . BREAST EXCISIONAL BIOPSY Left 1998  . BREAST LUMPECTOMY Left 2009   radiation  . COLONOSCOPY  2013  . OOPHORECTOMY    . TONSILLECTOMY  1957    Family History  Problem Relation Age of Onset  . Cancer Other        cousin, breast  . Cancer Father        pancreatic (died at age 75)  . Breast cancer Cousin 70  . Cancer Mother        skin - unsure of type but thinks it was melanoma (died at age 43)  . Cancer Maternal Aunt     Allergies  Allergen Reactions  . Ceclor [Cefaclor] Hives  . Ampicillin Rash    Current Outpatient Medications on File Prior to Visit  Medication Sig Dispense Refill  . amLODipine (NORVASC) 5 MG tablet Take 1 tablet (5 mg total) by mouth daily. 90 tablet 3  . ibuprofen (ADVIL,MOTRIN) 200 MG tablet Take 200 mg by mouth every 6 (six) hours as needed.     No current facility-administered medications on file prior to visit.    BP 111/69   Ht 5\' 1"  (1.549 m)   Wt 157 lb (71.2 kg)   BMI 29.66 kg/m   Observations/Objective:  Gen: Awake, alert, no acute distress Resp: Breathing is even and non-labored Psych: calm/pleasant demeanor Neuro: Alert and Oriented x 3, + facial symmetry, speech is clear. EXT: Patient sends in pictures of her bilateral legs and it is clear that her left lower extremity is larger in size than the right.  I do not see any unusual rash but I do see some freckles on her lower legs. See photos in Epic.  Assessment and Plan:  Bilateral lower extremity edema with left greater than right and left posterior thigh achiness associated.  Concern is a possible DVT left leg.  She did begin Norvasc 5 mg 6 weeks ago and that is known to contribute to ankle swelling.  Patient states at baseline she will get ankle  swelling if she is on an airplane or on her feet too long.  What is concerning is the unilateral distribution and the pain in the thigh.  She is being sent  for stat bilateral Doppler lower extremity ultrasound today.  For hypertension: We discussed today stopping the Norvasc and switching to a different blood pressure medicine such as losartan.  Patient would rather hold on making any changes.  She was asked to make an appointment  with Joycelyn Schmid to her talk about this further next week pending ultrasound findings today.   Follow Up Instructions:    I discussed the assessment and treatment plan with the patient. The patient was provided an opportunity to ask questions and all were answered. The patient agreed with the plan and demonstrated an understanding of the instructions.   The patient was advised to call back or seek an in-person evaluation if the symptoms worsen or if the condition fails to improve as anticipated.    Denice Paradise, NP

## 2019-07-17 ENCOUNTER — Ambulatory Visit (INDEPENDENT_AMBULATORY_CARE_PROVIDER_SITE_OTHER): Payer: PPO | Admitting: Family

## 2019-07-17 ENCOUNTER — Other Ambulatory Visit: Payer: Self-pay

## 2019-07-17 ENCOUNTER — Encounter: Payer: Self-pay | Admitting: Family

## 2019-07-17 VITALS — BP 152/86 | HR 103 | Temp 97.5°F | Ht 61.0 in | Wt 158.8 lb

## 2019-07-17 DIAGNOSIS — M79604 Pain in right leg: Secondary | ICD-10-CM | POA: Diagnosis not present

## 2019-07-17 DIAGNOSIS — M79605 Pain in left leg: Secondary | ICD-10-CM | POA: Diagnosis not present

## 2019-07-17 DIAGNOSIS — I159 Secondary hypertension, unspecified: Secondary | ICD-10-CM | POA: Diagnosis not present

## 2019-07-17 LAB — CBC WITH DIFFERENTIAL/PLATELET
Basophils Absolute: 0.1 10*3/uL (ref 0.0–0.1)
Basophils Relative: 0.9 % (ref 0.0–3.0)
Eosinophils Absolute: 0.1 10*3/uL (ref 0.0–0.7)
Eosinophils Relative: 1.1 % (ref 0.0–5.0)
HCT: 37.3 % (ref 36.0–46.0)
Hemoglobin: 12.4 g/dL (ref 12.0–15.0)
Lymphocytes Relative: 24 % (ref 12.0–46.0)
Lymphs Abs: 1.5 10*3/uL (ref 0.7–4.0)
MCHC: 33.3 g/dL (ref 30.0–36.0)
MCV: 92.3 fl (ref 78.0–100.0)
Monocytes Absolute: 0.5 10*3/uL (ref 0.1–1.0)
Monocytes Relative: 8.7 % (ref 3.0–12.0)
Neutro Abs: 4 10*3/uL (ref 1.4–7.7)
Neutrophils Relative %: 65.3 % (ref 43.0–77.0)
Platelets: 257 10*3/uL (ref 150.0–400.0)
RBC: 4.05 Mil/uL (ref 3.87–5.11)
RDW: 13.5 % (ref 11.5–15.5)
WBC: 6.2 10*3/uL (ref 4.0–10.5)

## 2019-07-17 LAB — URINALYSIS, ROUTINE W REFLEX MICROSCOPIC
Bilirubin Urine: NEGATIVE
Hgb urine dipstick: NEGATIVE
Ketones, ur: NEGATIVE
Leukocytes,Ua: NEGATIVE
Nitrite: NEGATIVE
Specific Gravity, Urine: 1.015 (ref 1.000–1.030)
Total Protein, Urine: NEGATIVE
Urine Glucose: NEGATIVE
Urobilinogen, UA: 0.2 (ref 0.0–1.0)
pH: 7 (ref 5.0–8.0)

## 2019-07-17 LAB — COMPREHENSIVE METABOLIC PANEL
ALT: 13 U/L (ref 0–35)
AST: 15 U/L (ref 0–37)
Albumin: 4.2 g/dL (ref 3.5–5.2)
Alkaline Phosphatase: 73 U/L (ref 39–117)
BUN: 14 mg/dL (ref 6–23)
CO2: 30 mEq/L (ref 19–32)
Calcium: 9.6 mg/dL (ref 8.4–10.5)
Chloride: 106 mEq/L (ref 96–112)
Creatinine, Ser: 0.87 mg/dL (ref 0.40–1.20)
GFR: 64.57 mL/min (ref >60.00)
Glucose, Bld: 108 mg/dL — ABNORMAL HIGH (ref 70–99)
Potassium: 4.2 mEq/L (ref 3.5–5.1)
Sodium: 140 mEq/L (ref 135–145)
Total Bilirubin: 0.3 mg/dL (ref 0.2–1.2)
Total Protein: 6.7 g/dL (ref 6.0–8.3)

## 2019-07-17 LAB — MAGNESIUM: Magnesium: 2 mg/dL (ref 1.5–2.5)

## 2019-07-17 LAB — B12 AND FOLATE PANEL
Folate: 18.4 ng/mL (ref >5.9)
Vitamin B-12: 650 pg/mL (ref 211–911)

## 2019-07-17 LAB — CK: Total CK: 43 U/L (ref 7–177)

## 2019-07-17 LAB — VITAMIN D 25 HYDROXY (VIT D DEFICIENCY, FRACTURES): VITD: 26.68 ng/mL — ABNORMAL LOW (ref 30.00–100.00)

## 2019-07-17 LAB — TSH: TSH: 3.35 u[IU]/mL (ref 0.35–4.50)

## 2019-07-17 NOTE — Progress Notes (Signed)
Subjective:    Patient ID: Kelly Mata, female    DOB: Aug 03, 1950, 69 y.o.   MRN: [id number]  CC: Kelly CARRENO is a 69 y.o. female who presents today for an acute visit.    HPI: Complains bilateral anterior lower leg pain Worse at night and affects her sleep. Describes as 'charlie horse'' Describes as burning pain, noticed first in left leg and feels the burning pain on right leg.  Started walking more for 1.5 mile. More active , less bothersome pain is.  Drinking water however not sure if enough. No improvement with ibuprofen.  Stopped amlodipine due to leg swelling. Started hawthorne for blood pressure and feels readings At home BP today, 118/77, 126/86, 116/70, 92/49.  No h/o dvt  US BL 06/28/2019-negative for DVT bilaterally  HISTORY:  Past Medical History:  Diagnosis Date  . Allergy   . Breast cancer ([place]) 2009   neg  . Cancer [facility]) 2009   left breast DCIS, 5 yrs tamoxifen  . Headache   . Heart murmur 2008  . Personal history of radiation therapy   . Squamous cell carcinoma    Past Surgical History:  Procedure Laterality Date  . ABDOMINAL HYSTERECTOMY  1998   complete   . BREAST BIOPSY Left 2009   stereo breast biopsy, +  . BREAST CYST ASPIRATION  90s  . BREAST EXCISIONAL BIOPSY Left 2008   neg  . BREAST EXCISIONAL BIOPSY Left 1998  . BREAST LUMPECTOMY Left 2009   radiation  . COLONOSCOPY  2013  . OOPHORECTOMY    . TONSILLECTOMY  1957   Family History  Problem Relation Age of Onset  . Cancer Other        cousin, breast  . Cancer Father        pancreatic (died at age 60)  . Breast cancer Cousin 32  . Cancer Mother        skin - unsure of type but thinks it was melanoma (died at age 70)  . Cancer Maternal Aunt     Allergies: Ceclor [cefaclor] and Ampicillin Current Outpatient Medications on File Prior to Visit  Medication Sig Dispense Refill  . HAWTHORN PO Take 5 capsules by mouth daily.    Marland Kitchen ibuprofen (ADVIL,MOTRIN) 200 MG tablet Take 200  mg by mouth every 6 (six) hours as needed.     No current facility-administered medications on file prior to visit.    Social History   Tobacco Use  . Smoking status: Never Smoker  . Smokeless tobacco: Never Used  Substance Use Topics  . Alcohol use: Yes    Comment: 5 glasses of wine  . Drug use: No    Review of Systems  Constitutional: Negative for chills and fever.  Respiratory: Negative for cough and shortness of breath.   Cardiovascular: Negative for chest pain, palpitations and leg swelling.  Gastrointestinal: Negative for nausea and vomiting.  Musculoskeletal: Positive for myalgias.  Neurological: Negative for tremors, weakness and numbness.      Objective:    BP (!) 152/86 (BP Location: Right Arm, Patient Position: Sitting, Cuff Size: Large)   Pulse (!) 103   Temp (!) 97.5 F (36.4 C) (Temporal)   Ht 5\' 1"  (1.549 m)   Wt 158 lb 12.8 oz (72 kg)   SpO2 99%   BMI 30.00 kg/m    Physical Exam Vitals reviewed.  Constitutional:      Appearance: She is well-developed.  HENT:     Mouth/Throat:  Pharynx: Uvula midline.  Eyes:     Conjunctiva/sclera: Conjunctivae normal.     Pupils: Pupils are equal, round, and reactive to light.     Comments: Fundus normal bilaterally.   Cardiovascular:     Rate and Rhythm: Normal rate and regular rhythm.     Pulses: Normal pulses.     Heart sounds: Normal heart sounds.  Pulmonary:     Effort: Pulmonary effort is normal.     Breath sounds: Normal breath sounds. No wheezing, rhonchi or rales.  Musculoskeletal:     Lumbar back: Normal. No tenderness or bony tenderness.     Right knee: Normal. No swelling.     Left knee: Normal. No swelling.     Right lower leg: No tenderness or bony tenderness. No edema.     Left lower leg: No tenderness or bony tenderness. No edema.     Comments: Palpable pedal pulses bilaterally.  Sensation intact.  No rash, discoloration of the skin.  Skin:    General: Skin is warm and dry.    Neurological:     Mental Status: She is alert.     Cranial Nerves: No cranial nerve deficit.     Sensory: No sensory deficit.     Deep Tendon Reflexes:     Reflex Scores:      Bicep reflexes are 2+ on the right side and 2+ on the left side.      Patellar reflexes are 2+ on the right side and 2+ on the left side.    Comments: Grip equal and strong bilateral upper extremities. Gait strong and steady. Able to perform rapid alternating movement without difficulty.   Psychiatric:        Speech: Speech normal.        Behavior: Behavior normal.        Thought Content: Thought content normal.        Assessment & Plan:   Problem List Items Addressed This Visit      Cardiovascular and Mediastinum   HTN (hypertension)    Elevated in the office today.  No signs or symptoms of hypertensive urgency or emergency at this time. we calibrated her home blood pressure cuff, it was actually 10 points higher than her blood pressure reading in the office.  Blood pressure readings at home have been very well controlled.  She is currently taking a new supplement, hawthorne, to which I advised I do not have any formal data to support.  At this time, I suspect her blood pressure at home is likely more accurate.  In the absence of any symptoms today, have advised her to keep the amlodipine on hand and to use a dose of 2.5 mg if her blood pressure is greater than 135/85.  Patient will call me if blood pressure or to elevate or she has any further concerns.        Other   Leg pain, bilateral - Primary    Etiology of pain is nonspecific at this time.  Discussed with patient differentials including overuse syndrome from increased walking (although pain improves when walking), vitamin deficiency.  SHe does not present like intermittent claudication nor does she still have any swelling in her legs since she has stopped amlodipine.  Offered a trial of gabapentin, patient  declines.  She will try over-the-counter  capsaicin and will wait on labs      Relevant Orders   CBC with Differential/Platelet   Comprehensive metabolic panel   TSH   VITAMIN  D 25 Hydroxy (Vit-D Deficiency, Fractures)   B12 and Folate Panel   Magnesium   CK   Urinalysis, Routine w reflex microscopic        I have discontinued [name]. [place]'s amLODipine. I am also having her maintain her ibuprofen and HAWTHORN PO.   No orders of the defined types were placed in this encounter.   Return precautions given.   Risks, benefits, and alternatives of the medications and treatment plan prescribed today were discussed, and patient expressed understanding.   Education regarding symptom management and diagnosis given to patient on AVS.  Continue to follow with Burnard Hawthorne, FNP for routine health maintenance.   Kelly Mata and I agreed with plan.   Mable Paris, FNP

## 2019-07-17 NOTE — Patient Instructions (Addendum)
Take 2.5 mg amlodipine  If BP > 135/85.    Monitor blood pressure at home and me 5-6 reading on separate days. Goal is less than 120/80, based on newest guidelines, however we certainly want to be less than 130/80;  if persistently higher, please make sooner follow up appointment so we can recheck you blood pressure and manage/ adjust medications.    Any shortness of breath, sharp chest pain, headache or vision changes, most certainly warrants emergency room evaluation.  OTC capsaicin.   Trial tonic water with quinine, gentle stretching.   Stretch prior to walking. Ice afterward.   Managing Your Hypertension Hypertension is commonly called high blood pressure. This is when the force of your blood pressing against the walls of your arteries is too strong. Arteries are blood vessels that carry blood from your heart throughout your body. Hypertension forces the heart to work harder to pump blood, and may cause the arteries to become narrow or stiff. Having untreated or uncontrolled hypertension can cause heart attack, stroke, kidney disease, and other problems. What are blood pressure readings? A blood pressure reading consists of a higher number over a lower number. Ideally, your blood pressure should be below 120/80. The first ("top") number is called the systolic pressure. It is a measure of the pressure in your arteries as your heart beats. The second ("bottom") number is called the diastolic pressure. It is a measure of the pressure in your arteries as the heart relaxes. What does my blood pressure reading mean? Blood pressure is classified into four stages. Based on your blood pressure reading, your health care provider may use the following stages to determine what type of treatment you need, if any. Systolic pressure and diastolic pressure are measured in a unit called mm Hg. Normal  Systolic pressure: below 123456.  Diastolic pressure: below 80. Elevated  Systolic pressure:  [id number].  Diastolic pressure: below 80. Hypertension stage 1  Systolic pressure: 0000000.  Diastolic pressure: [id number]. Hypertension stage 2  Systolic pressure: [id number] or above.  Diastolic pressure: 90 or above. What health risks are associated with hypertension? Managing your hypertension is an important responsibility. Uncontrolled hypertension can lead to:  A heart attack.  A stroke.  A weakened blood vessel (aneurysm).  Heart failure.  Kidney damage.  Eye damage.  Metabolic syndrome.  Memory and concentration problems. What changes can I make to manage my hypertension? Hypertension can be managed by making lifestyle changes and possibly by taking medicines. Your health care provider will help you make a plan to bring your blood pressure within a normal range. Eating and drinking   Eat a diet that is high in fiber and potassium, and low in salt (sodium), added sugar, and fat. An example eating plan is called the DASH (Dietary Approaches to Stop Hypertension) diet. To eat this way: ? Eat plenty of fresh fruits and vegetables. Try to fill half of your plate at each meal with fruits and vegetables. ? Eat whole grains, such as whole wheat pasta, brown rice, or whole grain bread. Fill about one quarter of your plate with whole grains. ? Eat low-fat diary products. ? Avoid fatty cuts of meat, processed or cured meats, and poultry with skin. Fill about one quarter of your plate with lean proteins such as fish, chicken without skin, beans, eggs, and tofu. ? Avoid premade and processed foods. These tend to be higher in sodium, added sugar, and fat.  Reduce your daily sodium intake. Most people with hypertension should eat  less than 1,500 mg of sodium a day.  Limit alcohol intake to no more than 1 drink a day for nonpregnant women and 2 drinks a day for men. One drink equals 12 oz of beer, 5 oz of wine, or 1 oz of hard liquor. Lifestyle  Work with your health care provider to  maintain a healthy body weight, or to lose weight. Ask what an ideal weight is for you.  Get at least 30 minutes of exercise that causes your heart to beat faster (aerobic exercise) most days of the week. Activities may include walking, swimming, or biking.  Include exercise to strengthen your muscles (resistance exercise), such as weight lifting, as part of your weekly exercise routine. Try to do these types of exercises for 30 minutes at least 3 days a week.  Do not use any products that contain nicotine or tobacco, such as cigarettes and e-cigarettes. If you need help quitting, ask your health care provider.  Control any long-term (chronic) conditions you have, such as high cholesterol or diabetes. Monitoring  Monitor your blood pressure at home as told by your health care provider. Your personal target blood pressure may vary depending on your medical conditions, your age, and other factors.  Have your blood pressure checked regularly, as often as told by your health care provider. Working with your health care provider  Review all the medicines you take with your health care provider because there may be side effects or interactions.  Talk with your health care provider about your diet, exercise habits, and other lifestyle factors that may be contributing to hypertension.  Visit your health care provider regularly. Your health care provider can help you create and adjust your plan for managing hypertension. Will I need medicine to control my blood pressure? Your health care provider may prescribe medicine if lifestyle changes are not enough to get your blood pressure under control, and if:  Your systolic blood pressure is 130 or higher.  Your diastolic blood pressure is 80 or higher. Take medicines only as told by your health care provider. Follow the directions carefully. Blood pressure medicines must be taken as prescribed. The medicine does not work as well when you skip doses.  Skipping doses also puts you at risk for problems. Contact a health care provider if:  You think you are having a reaction to medicines you have taken.  You have repeated (recurrent) headaches.  You feel dizzy.  You have swelling in your ankles.  You have trouble with your vision. Get help right away if:  You develop a severe headache or confusion.  You have unusual weakness or numbness, or you feel faint.  You have severe pain in your chest or abdomen.  You vomit repeatedly.  You have trouble breathing. Summary  Hypertension is when the force of blood pumping through your arteries is too strong. If this condition is not controlled, it may put you at risk for serious complications.  Your personal target blood pressure may vary depending on your medical conditions, your age, and other factors. For most people, a normal blood pressure is less than 120/80.  Hypertension is managed by lifestyle changes, medicines, or both. Lifestyle changes include weight loss, eating a healthy, low-sodium diet, exercising more, and limiting alcohol. This information is not intended to replace advice given to you by your health care provider. Make sure you discuss any questions you have with your health care provider. Document Revised: 06/22/2018 Document Reviewed: 01/27/2016 Elsevier Patient Education  [phone]  [employer].

## 2019-07-17 NOTE — Assessment & Plan Note (Signed)
Etiology of pain is nonspecific at this time.  Discussed with patient differentials including overuse syndrome from increased walking (although pain improves when walking), vitamin deficiency.  SHe does not present like intermittent claudication nor does she still have any swelling in her legs since she has stopped amlodipine.  Offered a trial of gabapentin, patient  declines.  She will try over-the-counter capsaicin and will wait on labs

## 2019-07-17 NOTE — Assessment & Plan Note (Signed)
Elevated in the office today.  No signs or symptoms of hypertensive urgency or emergency at this time. we calibrated her home blood pressure cuff, it was actually 10 points higher than her blood pressure reading in the office.  Blood pressure readings at home have been very well controlled.  She is currently taking a new supplement, hawthorne, to which I advised I do not have any formal data to support.  At this time, I suspect her blood pressure at home is likely more accurate.  In the absence of any symptoms today, have advised her to keep the amlodipine on hand and to use a dose of 2.5 mg if her blood pressure is greater than 135/85.  Patient will call me if blood pressure or to elevate or she has any further concerns.

## 2019-07-23 ENCOUNTER — Encounter: Payer: Self-pay | Admitting: Gastroenterology

## 2019-07-23 ENCOUNTER — Ambulatory Visit: Payer: PPO | Admitting: Gastroenterology

## 2019-07-23 ENCOUNTER — Other Ambulatory Visit: Payer: Self-pay

## 2019-07-23 DIAGNOSIS — K769 Liver disease, unspecified: Secondary | ICD-10-CM

## 2019-07-23 DIAGNOSIS — A6000 Herpesviral infection of urogenital system, unspecified: Secondary | ICD-10-CM | POA: Insufficient documentation

## 2019-07-23 DIAGNOSIS — G43909 Migraine, unspecified, not intractable, without status migrainosus: Secondary | ICD-10-CM | POA: Insufficient documentation

## 2019-07-23 DIAGNOSIS — M1990 Unspecified osteoarthritis, unspecified site: Secondary | ICD-10-CM | POA: Insufficient documentation

## 2019-07-23 NOTE — Progress Notes (Signed)
Gastroenterology Consultation  Referring Provider:     Burnard Hawthorne, FNP Primary Care Physician:  Burnard Hawthorne, FNP Primary Gastroenterologist:  Dr. Allen Norris     Reason for Consultation:     Liver lesion        HPI:   Kelly Mata is a 69 y.o. y/o female referred for consultation & management of liver lesion by Dr. Vidal Schwalbe, Yvetta Coder, FNP.  This patient comes in today with a history of breast cancer that she reports she has been in remission from for many years.  The patient was having some abdominal/chest discomfort and had a CT scan that showed a spot on her liver.  The lesion was recommended to be followed up with an MRI.  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  It was recommended that the patient have a repeat MRI in 3 months.  The patient's last MRI was in February.  She denies any unexplained weight loss fevers chills nausea or vomiting.  The patient does report that she had a colonoscopy when she was 69 years old by Dr. Jamal Collin.  She reports that she was told it was normal at that time. There is no report of any change in bowel habits.  Past Medical History:  Diagnosis Date  . Allergy   . Breast cancer ([place]) 2009   neg  . Cancer [facility]) 2009   left breast DCIS, 5 yrs tamoxifen  . Headache   . Heart murmur 2008  . Personal history of radiation therapy   . Squamous cell carcinoma     Past Surgical History:  Procedure Laterality Date  . ABDOMINAL HYSTERECTOMY  1998   complete   . BREAST BIOPSY Left 2009   stereo breast biopsy, +  . BREAST CYST ASPIRATION  90s  . BREAST EXCISIONAL BIOPSY Left 2008   neg  . BREAST EXCISIONAL BIOPSY Left 1998  . BREAST LUMPECTOMY Left 2009   radiation  . COLONOSCOPY  2013  . OOPHORECTOMY    . TONSILLECTOMY  1957    Prior to Admission medications   Medication Sig Start Date End Date Taking? Authorizing Provider  HAWTHORN PO Take 5 capsules  by mouth daily.   Yes [provider]  ibuprofen (ADVIL,MOTRIN) 200 MG tablet Take 200 mg by mouth every 6 (six) hours as needed.   Yes [provider]    Family History  Problem Relation Age of Onset  . Cancer Other        cousin, breast  . Cancer Father        pancreatic (died at age 63)  . Breast cancer Cousin 90  . Cancer Mother        skin - unsure of type but thinks it was melanoma (died at age 75)  . Cancer Maternal Aunt      Social History   Tobacco Use  . Smoking status: Never Smoker  . Smokeless tobacco: Never Used  Substance Use Topics  . Alcohol use: Yes    Comment: 5 glasses of wine  . Drug use: No    Allergies as of 07/23/2019 - Review Complete 07/23/2019  Allergen Reaction Noted  . Ceclor [cefaclor] Hives 07/24/2012  . Ampicillin Rash 07/24/2012    Review of Systems:    All systems reviewed and negative except where noted in HPI.   Physical Exam:  BP 138/82   Pulse 87  Temp 97.9 F (36.6 C) (Oral)   Ht 5\' 1"  (1.549 m)   Wt 161 lb 3.2 oz (73.1 kg)   BMI 30.46 kg/m  No LMP recorded. Patient has had a hysterectomy. General:   Alert,  Well-developed, well-nourished, pleasant and cooperative in NAD Head:  Normocephalic and atraumatic. Eyes:  Sclera clear, no icterus.   Conjunctiva pink. Ears:  Normal auditory acuity. Neck:  Supple; no masses or thyromegaly. Lungs:  Respirations even and unlabored.  Clear throughout to auscultation.   No wheezes, crackles, or rhonchi. No acute distress. Heart:  Regular rate and rhythm; no murmurs, clicks, rubs, or gallops. Abdomen:  Normal bowel sounds.  No bruits.  Soft, non-tender and non-distended without masses, hepatosplenomegaly or hernias noted.  No guarding or rebound tenderness.  Negative Carnett sign.   Rectal:  Deferred.  Pulses:  Normal pulses noted. Extremities:  No clubbing or edema.  No cyanosis. Neurologic:  Alert and oriented x3;  grossly normal neurologically. Skin:  Intact  without significant lesions or rashes.  No jaundice. Lymph Nodes:  No significant cervical adenopathy. Psych:  Alert and cooperative. Normal mood and affect.  Imaging Studies: US Venous Img Lower Bilateral (DVT)  Result Date: 06/28/2019 CLINICAL DATA:  Bilateral lower extremity edema worse on the left EXAM: BILATERAL LOWER EXTREMITY VENOUS DOPPLER ULTRASOUND TECHNIQUE: Gray-scale sonography with graded compression, as well as color Doppler and duplex ultrasound were performed to evaluate the lower extremity deep venous systems from the level of the common femoral vein and including the common femoral, femoral, profunda femoral, popliteal and calf veins including the posterior tibial, peroneal and gastrocnemius veins when visible. The superficial great saphenous vein was also interrogated. Spectral Doppler was utilized to evaluate flow at rest and with distal augmentation maneuvers in the common femoral, femoral and popliteal veins. COMPARISON:  None. FINDINGS: RIGHT LOWER EXTREMITY Common Femoral Vein: No evidence of thrombus. Normal compressibility, respiratory phasicity and response to augmentation. Saphenofemoral Junction: No evidence of thrombus. Normal compressibility and flow on color Doppler imaging. Profunda Femoral Vein: No evidence of thrombus. Normal compressibility and flow on color Doppler imaging. Femoral Vein: No evidence of thrombus. Normal compressibility, respiratory phasicity and response to augmentation. Popliteal Vein: No evidence of thrombus. Normal compressibility, respiratory phasicity and response to augmentation. Calf Veins: No evidence of thrombus. Normal compressibility and flow on color Doppler imaging. LEFT LOWER EXTREMITY Common Femoral Vein: No evidence of thrombus. Normal compressibility, respiratory phasicity and response to augmentation. Saphenofemoral Junction: No evidence of thrombus. Normal compressibility and flow on color Doppler imaging. Profunda Femoral Vein: No  evidence of thrombus. Normal compressibility and flow on color Doppler imaging. Femoral Vein: No evidence of thrombus. Normal compressibility, respiratory phasicity and response to augmentation. Popliteal Vein: No evidence of thrombus. Normal compressibility, respiratory phasicity and response to augmentation. Calf Veins: No evidence of thrombus. Normal compressibility and flow on color Doppler imaging. IMPRESSION: No evidence of deep venous thrombosis in either lower extremity. Electronically Signed   By: Jerilynn Mages.  Shick M.D.   On: 06/28/2019 16:05    Assessment and Plan:   Kelly Mata is a 69 y.o. y/o female who comes in with a 1.3 cm lesion in the liver that was seen on a CT scan with a follow-up MRI suggesting a repeat MRI in 3 months with contrast.  The patient has no symptoms and the lesion was reported to be either a hepatic adenoma versus focal nodular hyperplasia.  If the patient's lesion is shown to be a hepatic adenoma then  yearly MRIs for evaluation would be recommended.  Focal nodular hyperplasia usually does not require any further surveillance.  The patient will have the MRI with contrast and will be notified of the results and further recommendations.  The patient has been explained the plan and agrees with it.    Lucilla Lame, MD. Marval Regal    Note: This dictation was prepared with Dragon dictation along with smaller phrase technology. Any transcriptional errors that result from this process are unintentional.

## 2019-08-19 ENCOUNTER — Ambulatory Visit: Payer: PPO

## 2019-08-28 ENCOUNTER — Other Ambulatory Visit: Payer: Self-pay

## 2019-08-28 ENCOUNTER — Ambulatory Visit
Admission: RE | Admit: 2019-08-28 | Discharge: 2019-08-28 | Disposition: A | Discharge status: Home / Self Care | Payer: PPO | Source: Ambulatory Visit | Attending: Gastroenterology | Admitting: Gastroenterology

## 2019-08-28 DIAGNOSIS — K769 Liver disease, unspecified: Secondary | ICD-10-CM | POA: Insufficient documentation

## 2019-08-28 MED ORDER — GADOXETATE DISODIUM 0.25 MMOL/ML IV SOLN
10.0000 mL | Freq: Once | INTRAVENOUS | Status: AC | PRN
  Administered 2019-08-28: 7 mL via INTRAVENOUS

## 2019-08-30 ENCOUNTER — Telehealth: Payer: Self-pay

## 2019-08-30 NOTE — Telephone Encounter (Signed)
Left vm for pt to return my call regarding MRI results. Sent these results to the pt via mychart as well.

## 2019-08-30 NOTE — Telephone Encounter (Signed)
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  That is a lesion that needs follow-up to make sure it does not increase in size.  The radiologist recommended a repeat MRI with contrast in 6 months to make sure it is not growing

## 2019-09-02 ENCOUNTER — Ambulatory Visit (INDEPENDENT_AMBULATORY_CARE_PROVIDER_SITE_OTHER): Payer: PPO

## 2019-09-02 VITALS — Ht 61.0 in | Wt 161.0 lb

## 2019-09-02 DIAGNOSIS — Z0000 Encounter for general adult medical examination without abnormal findings: Secondary | ICD-10-CM

## 2019-09-02 NOTE — Patient Instructions (Addendum)
Kelly Mata , Thank you for taking time to come for your Medicare Wellness Visit. I appreciate your ongoing commitment to your health goals. Please review the following plan we discussed and let me know if I can assist you in the future.   These are the goals we discussed: Goals      Patient Stated   .  Weight (lb) < 164 lb (74.4 kg) (pt-stated)      Lose 10lbs Reduce sugar/cookie intake       This is a list of the screening recommended for you and due dates:  Health Maintenance  Topic Date Due  . Flu Shot  10/13/2019  . Tetanus Vaccine  03/14/2021  . Mammogram  04/11/2021  . Colon Cancer Screening  03/14/2024  . DEXA scan (bone density measurement)  Completed  . COVID-19 Vaccine  Completed  .  Hepatitis C: One time screening is recommended by Center for Disease Control  (CDC) for  adults born from [birth year] through 1965.   Completed  . Pneumonia vaccines  Completed    Immunizations Immunization History  Administered Date(s) Administered  . Influenza-Unspecified 01/10/2017  . PFIZER SARS-COV-2 Vaccination 05/06/2019, 05/27/2019  . Pneumococcal Conjugate-13 06/30/2016  . Pneumococcal Polysaccharide-23 08/30/2017  . Td 08/13/2007  . Zoster Recombinat (Shingrix) 11/06/2017, 02/02/2018   Cpe  09/03/19  Advanced directives: yes  Conditions/risks identified: none  Follow up in one year for your annual wellness visit   Preventive Care 65 Years and Older, Female Preventive care refers to lifestyle choices and visits with your health care provider that can promote health and wellness. What does preventive care include?  A yearly physical exam. This is also called an annual well check.  Dental exams once or twice a year.  Routine eye exams. Ask your health care provider how often you should have your eyes checked.  Personal lifestyle choices, including:  Daily care of your teeth and gums.  Regular physical activity.  Eating a healthy diet.  Avoiding tobacco and drug  use.  Limiting alcohol use.  Practicing safe sex.  Taking low-dose aspirin every day.  Taking vitamin and mineral supplements as recommended by your health care provider. What happens during an annual well check? The services and screenings done by your health care provider during your annual well check will depend on your age, overall health, lifestyle risk factors, and family history of disease. Counseling  Your health care provider may ask you questions about your:  Alcohol use.  Tobacco use.  Drug use.  Emotional well-being.  Home and relationship well-being.  Sexual activity.  Eating habits.  History of falls.  Memory and ability to understand (cognition).  Work and work Statistician.  Reproductive health. Screening  You may have the following tests or measurements:  Height, weight, and BMI.  Blood pressure.  Lipid and cholesterol levels. These may be checked every 5 years, or more frequently if you are over 78 years old.  Skin check.  Lung cancer screening. You may have this screening every year starting at age 42 if you have a 30-pack-year history of smoking and currently smoke or have quit within the past 15 years.  Fecal occult blood test (FOBT) of the stool. You may have this test every year starting at age 49.  Flexible sigmoidoscopy or colonoscopy. You may have a sigmoidoscopy every 5 years or a colonoscopy every 10 years starting at age 45.  Hepatitis C blood test.  Hepatitis B blood test.  Sexually transmitted disease (STD) testing.  Diabetes screening. This is done by checking your blood sugar (glucose) after you have not eaten for a while (fasting). You may have this done every 1-3 years.  Bone density scan. This is done to screen for osteoporosis. You may have this done starting at age 75.  Mammogram. This may be done every 1-2 years. Talk to your health care provider about how often you should have regular mammograms. Talk with your  health care provider about your test results, treatment options, and if necessary, the need for more tests. Vaccines  Your health care provider may recommend certain vaccines, such as:  Influenza vaccine. This is recommended every year.  Tetanus, diphtheria, and acellular pertussis (Tdap, Td) vaccine. You may need a Td booster every 10 years.  Zoster vaccine. You may need this after age 6.  Pneumococcal 13-valent conjugate (PCV13) vaccine. One dose is recommended after age 63.  Pneumococcal polysaccharide (PPSV23) vaccine. One dose is recommended after age 82. Talk to your health care provider about which screenings and vaccines you need and how often you need them. This information is not intended to replace advice given to you by your health care provider. Make sure you discuss any questions you have with your health care provider. Document Released: 03/27/2015 Document Revised: 11/18/2015 Document Reviewed: 12/30/2014 Elsevier Interactive Patient Education  2017 [place] Prevention in the Home Falls can cause injuries. They can happen to people of all ages. There are many things you can do to make your home safe and to help prevent falls. What can I do on the outside of my home?  Regularly fix the edges of walkways and driveways and fix any cracks.  Remove anything that might make you trip as you walk through a door, such as a raised step or threshold.  Trim any bushes or trees on the path to your home.  Use bright outdoor lighting.  Clear any walking paths of anything that might make someone trip, such as rocks or tools.  Regularly check to see if handrails are loose or broken. Make sure that both sides of any steps have handrails.  Any raised decks and porches should have guardrails on the edges.  Have any leaves, snow, or ice cleared regularly.  Use sand or salt on walking paths during winter.  Clean up any spills in your garage right away. This includes oil  or grease spills. What can I do in the bathroom?  Use night lights.  Install grab bars by the toilet and in the tub and shower. Do not use towel bars as grab bars.  Use non-skid mats or decals in the tub or shower.  If you need to sit down in the shower, use a plastic, non-slip stool.  Keep the floor dry. Clean up any water that spills on the floor as soon as it happens.  Remove soap buildup in the tub or shower regularly.  Attach bath mats securely with double-sided non-slip rug tape.  Do not have throw rugs and other things on the floor that can make you trip. What can I do in the bedroom?  Use night lights.  Make sure that you have a light by your bed that is easy to reach.  Do not use any sheets or blankets that are too big for your bed. They should not hang down onto the floor.  Have a firm chair that has side arms. You can use this for support while you get dressed.  Do not have throw  rugs and other things on the floor that can make you trip. What can I do in the kitchen?  Clean up any spills right away.  Avoid walking on wet floors.  Keep items that you use a lot in easy-to-reach places.  If you need to reach something above you, use a strong step stool that has a grab bar.  Keep electrical cords out of the way.  Do not use floor polish or wax that makes floors slippery. If you must use wax, use non-skid floor wax.  Do not have throw rugs and other things on the floor that can make you trip. What can I do with my stairs?  Do not leave any items on the stairs.  Make sure that there are handrails on both sides of the stairs and use them. Fix handrails that are broken or loose. Make sure that handrails are as long as the stairways.  Check any carpeting to make sure that it is firmly attached to the stairs. Fix any carpet that is loose or worn.  Avoid having throw rugs at the top or bottom of the stairs. If you do have throw rugs, attach them to the floor with  carpet tape.  Make sure that you have a light switch at the top of the stairs and the bottom of the stairs. If you do not have them, ask someone to add them for you. What else can I do to help prevent falls?  Wear shoes that:  Do not have high heels.  Have rubber bottoms.  Are comfortable and fit you well.  Are closed at the toe. Do not wear sandals.  If you use a stepladder:  Make sure that it is fully opened. Do not climb a closed stepladder.  Make sure that both sides of the stepladder are locked into place.  Ask someone to hold it for you, if possible.  Clearly mark and make sure that you can see:  Any grab bars or handrails.  First and last steps.  Where the edge of each step is.  Use tools that help you move around (mobility aids) if they are needed. These include:  Canes.  Walkers.  Scooters.  Crutches.  Turn on the lights when you go into a dark area. Replace any light bulbs as soon as they burn out.  Set up your furniture so you have a clear path. Avoid moving your furniture around.  If any of your floors are uneven, fix them.  If there are any pets around you, be aware of where they are.  Review your medicines with your doctor. Some medicines can make you feel dizzy. This can increase your chance of falling. Ask your doctor what other things that you can do to help prevent falls. This information is not intended to replace advice given to you by your health care provider. Make sure you discuss any questions you have with your health care provider. Document Released: 12/25/2008 Document Revised: 08/06/2015 Document Reviewed: 04/04/2014 Elsevier Interactive Patient Education  2017 [employer].

## 2019-09-02 NOTE — Progress Notes (Addendum)
Subjective:   Kelly Mata is a 69 y.o. female who presents for Medicare Annual (Subsequent) preventive examination.  Review of Systems    No ROS.  Medicare Wellness Virtual Visit.   Cardiac Risk Factors include: advanced age (>60men, >15 women);hypertension     Objective:    Today's Vitals   09/02/19 0840  Weight: 161 lb (73 kg)  Height: 5\' 1"  (1.549 m)   Body mass index is 30.42 kg/m.  Advanced Directives 09/02/2019 04/16/2019 08/31/2018 08/30/2017 07/11/2017 07/04/2017 06/22/2017  Does Patient Have a Medical Advance Directive? Yes No Yes Yes No No No  Type of Paramedic of [place];Living will - [place];Living will [place];Living will - - -  Does patient want to make changes to medical advance directive? No - Patient declined - No - Patient declined No - Patient declined - - -  Copy of [place] in Chart? No - copy requested - No - copy requested No - copy requested - - -  Would patient like information on creating a medical advance directive? - No - Patient declined - - - - -    Current Medications (verified) Outpatient Encounter Medications as of 09/02/2019  Medication Sig  . HAWTHORN PO Take 5 capsules by mouth daily.  Marland Kitchen ibuprofen (ADVIL,MOTRIN) 200 MG tablet Take 200 mg by mouth every 6 (six) hours as needed.   No facility-administered encounter medications on file as of 09/02/2019.    Allergies (verified) Ceclor [cefaclor] and Ampicillin   History: Past Medical History:  Diagnosis Date  . Allergy   . Breast cancer ([place]) 2009   neg  . Cancer [facility]) 2009   left breast DCIS, 5 yrs tamoxifen  . Headache   . Heart murmur 2008  . Personal history of radiation therapy   . Squamous cell carcinoma    Past Surgical History:  Procedure Laterality Date  . ABDOMINAL HYSTERECTOMY  1998   complete   . BREAST BIOPSY Left 2009   stereo breast biopsy, +  . BREAST CYST ASPIRATION  90s  .  BREAST EXCISIONAL BIOPSY Left 2008   neg  . BREAST EXCISIONAL BIOPSY Left 1998  . BREAST LUMPECTOMY Left 2009   radiation  . COLONOSCOPY  2013  . OOPHORECTOMY    . TONSILLECTOMY  1957   Family History  Problem Relation Age of Onset  . Cancer Other        cousin, breast  . Cancer Father        pancreatic (died at age 20)  . Breast cancer Cousin 44  . Cancer Mother        skin - unsure of type but thinks it was melanoma (died at age 78)  . Cancer Maternal Aunt   . Diabetes Daughter    Social History   Socioeconomic History  . Marital status: Married    Spouse name: Not on file  . Number of children: 2  . Years of education: 16  . Highest education level: Bachelor's degree (e.g., BA, AB, BS)  Occupational History  . Occupation: Retired Corporate treasurer  Tobacco Use  . Smoking status: Never Smoker  . Smokeless tobacco: Never Used  Vaping Use  . Vaping Use: Never used  Substance and Sexual Activity  . Alcohol use: Yes    Comment: 5 glasses of wine  . Drug use: No  . Sexual activity: Not on file  Other Topics Concern  . Not on file  Social History Narrative   Corporate treasurer   Retired      Married    2 children      Right-handed.   Caffeine use:  1 cup per day.   Social Determinants of Health   Financial Resource Strain:   . Difficulty of Paying Living Expenses:   Food Insecurity:   . Worried About Charity fundraiser in the Last Year:   . Arboriculturist in the Last Year:   Transportation Needs:   . Film/video editor (Medical):   Marland Kitchen Lack of Transportation (Non-Medical):   Physical Activity:   . Days of Exercise per Week:   . Minutes of Exercise per Session:   Stress:   . Feeling of Stress :   Social Connections:   . Frequency of Communication with Friends and Family:   . Frequency of Social Gatherings with Friends and Family:   . Attends Religious Services:   . Active Member of Clubs or Organizations:   . Attends Archivist Meetings:    Marland Kitchen Marital Status:     Tobacco Counseling Counseling given: Not Answered   Clinical Intake:  Pre-visit preparation completed: Yes        Diabetes: No  How often do you need to have someone help you when you read instructions, pamphlets, or other written materials from your doctor or pharmacy?: 1 - Never  Diabetic? No  Interpreter Needed?: No      Activities of Daily Living In your present state of health, do you have any difficulty performing the following activities: 09/02/2019  Hearing? N  Vision? N  Difficulty concentrating or making decisions? N  Walking or climbing stairs? N  Dressing or bathing? N  Doing errands, shopping? N  Preparing Food and eating ? N  Using the Toilet? N  In the past six months, have you accidently leaked urine? N  Do you have problems with loss of bowel control? N  Managing your Medications? N  Managing your Finances? N  Housekeeping or managing your Housekeeping? N  Some recent data might be hidden    Patient Care Team: Burnard Hawthorne, FNP as PCP - General (Family Medicine) Christene Lye, MD (General Surgery) Ammie Dalton, Okey Regal, MD (Unknown Physician Specialty)  Indicate any recent Medical Services you may have received from other than [facility] providers in the past year (date may be approximate).     Assessment:   This is a routine wellness examination for Kelly Mata.   I connected with Kelly Mata today by telephone and verified that I am speaking with the correct person using two identifiers. Location patient: home Location provider: work Persons participating in the virtual visit: patient, Marine scientist.    I discussed the limitations, risks, security and privacy concerns of performing an evaluation and management service by telephone and the availability of in person appointments. The patient expressed understanding and verbally consented to this telephonic visit.    Interactive audio and video telecommunications were  attempted between this provider and patient, however failed, due to patient having technical difficulties OR patient did not have access to video capability.  We continued and completed visit with audio only.  Some vital signs may be absent or patient reported.   Hearing/Vision screen  Hearing Screening   125Hz  250Hz  500Hz  1000Hz  2000Hz  3000Hz  4000Hz  6000Hz  8000Hz   Right ear:           Left ear:           Comments:  Patient is able to hear conversational tones without difficulty.  No issues reported.   Vision Screening Comments: Followed by [facility] Wears corrective lenses Cataract extraction, bilateral Visual acuity not assessed, virtual visit.  They have seen their ophthalmologist in the last 12 months.    Dietary issues and exercise activities discussed: Current Exercise Habits: Home exercise routine, Type of exercise: walking, Time (Minutes): 60, Frequency (Times/Week): 4, Weekly Exercise (Minutes/Week): 240, Intensity: Mild  Goals      Patient Stated   .  DIET - INCREASE WATER INTAKE (pt-stated)      Stay hydrated      Depression Screen PHQ 2/9 Scores 09/02/2019 06/28/2019 05/14/2019 04/26/2019 08/31/2018 08/31/2018 08/30/2017  PHQ - 2 Score 0 0 0 0 0 0 0  PHQ- 9 Score - 0 0 2 0 - -    Fall Risk Fall Risk  09/02/2019 06/28/2019 04/26/2019 08/31/2018 08/30/2017  Falls in the past year? 0 0 0 0 No  Number falls in past yr: 0 - 0 - -  Injury with Fall? - - 0 - -  Follow up Falls evaluation completed - Falls evaluation completed - -    Handrails in use ? Yes  Home free of loose throw rugs in walkways, pet beds, electrical cords, etc? Yes  Adequate lighting in your home to reduce risk of falls? Yes   ASSISTIVE DEVICES UTILIZED TO PREVENT FALLS:  Life alert? No  Use of a cane, walker or w/c? No  Grab bars in the bathroom? No  Shower chair or bench in shower? No  Elevated toilet seat or a handicapped toilet? No   TIMED UP AND GO:  Was the test performed? No .    Cognitive Function:     6CIT Screen 09/02/2019 08/31/2018 08/30/2017  What Year? 0 points 0 points 0 points  What month? 0 points 0 points 0 points  What time? - 0 points 0 points  Count back from 20 - 0 points 0 points  Months in reverse 0 points 0 points 0 points  Repeat phrase 0 points - 0 points  Total Score - - 0    Immunizations Immunization History  Administered Date(s) Administered  . Influenza-Unspecified 01/10/2017  . PFIZER SARS-COV-2 Vaccination 05/06/2019, 05/27/2019  . Pneumococcal Conjugate-13 06/30/2016  . Pneumococcal Polysaccharide-23 08/30/2017  . Td 08/13/2007  . Zoster Recombinat (Shingrix) 11/06/2017, 02/02/2018   Health Maintenance Health Maintenance  Topic Date Due  . INFLUENZA VACCINE  10/13/2019  . TETANUS/TDAP  03/14/2021  . MAMMOGRAM  04/11/2021  . COLONOSCOPY  03/14/2024  . DEXA SCAN  Completed  . COVID-19 Vaccine  Completed  . Hepatitis C Screening  Completed  . PNA vac Low Risk Adult  Completed    Lung Cancer Screening: (Low Dose CT Chest recommended if Age 98-80 years, 30 pack-year currently smoking OR have quit w/in 15years.) does not qualify.   Vision Screening: Recommended annual ophthalmology exams for early detection of glaucoma and other disorders of the eye. Is the patient up to date with their annual eye exam?  Yes   Dental Screening: Recommended annual dental exams for proper oral hygiene  Community Resource Referral / Chronic Care Management: CRR required this visit?  No  CCM required this visit?  No     Plan:    Keep all routine maintenance appointments.   Cpe  09/03/19  I have personally reviewed and noted the following in the patient's chart:   . Medical and social history . Use of  alcohol, tobacco or illicit drugs  . Current medications and supplements . Functional ability and status . Nutritional status . Physical activity . Advanced directives . List of other physicians . Hospitalizations, surgeries, and  ER visits in previous 12 months . Vitals . Screenings to include cognitive, depression, and falls . Referrals and appointments  In addition, I have reviewed and discussed with patient certain preventive protocols, quality metrics, and best practice recommendations. A written personalized care plan for preventive services as well as general preventive health recommendations were provided to patient via mychart.     Varney Biles, LPN   2/00/3794   Agree with plan. Mable Paris, NP

## 2019-09-03 ENCOUNTER — Encounter: Payer: Self-pay | Admitting: Family

## 2019-09-03 ENCOUNTER — Ambulatory Visit (INDEPENDENT_AMBULATORY_CARE_PROVIDER_SITE_OTHER): Payer: PPO | Admitting: Family

## 2019-09-03 ENCOUNTER — Ambulatory Visit: Payer: PPO

## 2019-09-03 ENCOUNTER — Other Ambulatory Visit: Payer: Self-pay

## 2019-09-03 VITALS — BP 125/85 | HR 83 | Temp 97.8°F | Ht 60.83 in | Wt 159.4 lb

## 2019-09-03 DIAGNOSIS — I159 Secondary hypertension, unspecified: Secondary | ICD-10-CM

## 2019-09-03 DIAGNOSIS — Z0000 Encounter for general adult medical examination without abnormal findings: Secondary | ICD-10-CM

## 2019-09-03 NOTE — Assessment & Plan Note (Signed)
Clinical breast exam performed today.  Patient politely declines pelvic exam in the absence of complaints and she states she has no cervix and is no longer doing cervical cancer screening.

## 2019-09-03 NOTE — Patient Instructions (Signed)
Always a pleasure seeing you!   Health Maintenance for Postmenopausal Women Menopause is a normal process in which your ability to get pregnant comes to an end. This process happens slowly over many months or years, usually between the ages of 63 and 53. Menopause is complete when you have missed your menstrual periods for 12 months. It is important to talk with your health care provider about some of the most common conditions that affect women after menopause (postmenopausal women). These include heart disease, cancer, and bone loss (osteoporosis). Adopting a healthy lifestyle and getting preventive care can help to promote your health and wellness. The actions you take can also lower your chances of developing some of these common conditions. What should I know about menopause? During menopause, you may get a number of symptoms, such as:  Hot flashes. These can be moderate or severe.  Night sweats.  Decrease in sex drive.  Mood swings.  Headaches.  Tiredness.  Irritability.  Memory problems.  Insomnia. Choosing to treat or not to treat these symptoms is a decision that you make with your health care provider. Do I need hormone replacement therapy?  Hormone replacement therapy is effective in treating symptoms that are caused by menopause, such as hot flashes and night sweats.  Hormone replacement carries certain risks, especially as you become older. If you are thinking about using estrogen or estrogen with progestin, discuss the benefits and risks with your health care provider. What is my risk for heart disease and stroke? The risk of heart disease, heart attack, and stroke increases as you age. One of the causes may be a change in the body's hormones during menopause. This can affect how your body uses dietary fats, triglycerides, and cholesterol. Heart attack and stroke are medical emergencies. There are many things that you can do to help prevent heart disease and  stroke. Watch your blood pressure  High blood pressure causes heart disease and increases the risk of stroke. This is more likely to develop in people who have high blood pressure readings, are of African descent, or are overweight.  Have your blood pressure checked: ? Every 3-5 years if you are 51-33 years of age. ? Every year if you are 6 years old or older. Eat a healthy diet   Eat a diet that includes plenty of vegetables, fruits, low-fat dairy products, and lean protein.  Do not eat a lot of foods that are high in solid fats, added sugars, or sodium. Get regular exercise Get regular exercise. This is one of the most important things you can do for your health. Most adults should:  Try to exercise for at least 150 minutes each week. The exercise should increase your heart rate and make you sweat (moderate-intensity exercise).  Try to do strengthening exercises at least twice each week. Do these in addition to the moderate-intensity exercise.  Spend less time sitting. Even light physical activity can be beneficial. Other tips  Work with your health care provider to achieve or maintain a healthy weight.  Do not use any products that contain nicotine or tobacco, such as cigarettes, e-cigarettes, and chewing tobacco. If you need help quitting, ask your health care provider.  Know your numbers. Ask your health care provider to check your cholesterol and your blood sugar (glucose). Continue to have your blood tested as directed by your health care provider. Do I need screening for cancer? Depending on your health history and family history, you may need to have cancer screening  at different stages of your life. This may include screening for:  Breast cancer.  Cervical cancer.  Lung cancer.  Colorectal cancer. What is my risk for osteoporosis? After menopause, you may be at increased risk for osteoporosis. Osteoporosis is a condition in which bone destruction happens more  quickly than new bone creation. To help prevent osteoporosis or the bone fractures that can happen because of osteoporosis, you may take the following actions:  If you are 30-55 years old, get at least 1,000 mg of calcium and at least 600 mg of vitamin D per day.  If you are older than age 30 but younger than age 73, get at least 1,200 mg of calcium and at least 600 mg of vitamin D per day.  If you are older than age 70, get at least 1,200 mg of calcium and at least 800 mg of vitamin D per day. Smoking and drinking excessive alcohol increase the risk of osteoporosis. Eat foods that are rich in calcium and vitamin D, and do weight-bearing exercises several times each week as directed by your health care provider. How does menopause affect my mental health? Depression may occur at any age, but it is more common as you become older. Common symptoms of depression include:  Low or sad mood.  Changes in sleep patterns.  Changes in appetite or eating patterns.  Feeling an overall lack of motivation or enjoyment of activities that you previously enjoyed.  Frequent crying spells. Talk with your health care provider if you think that you are experiencing depression. General instructions See your health care provider for regular wellness exams and vaccines. This may include:  Scheduling regular health, dental, and eye exams.  Getting and maintaining your vaccines. These include: ? Influenza vaccine. Get this vaccine each year before the flu season begins. ? Pneumonia vaccine. ? Shingles vaccine. ? Tetanus, diphtheria, and pertussis (Tdap) booster vaccine. Your health care provider may also recommend other immunizations. Tell your health care provider if you have ever been abused or do not feel safe at home. Summary  Menopause is a normal process in which your ability to get pregnant comes to an end.  This condition causes hot flashes, night sweats, decreased interest in sex, mood swings,  headaches, or lack of sleep.  Treatment for this condition may include hormone replacement therapy.  Take actions to keep yourself healthy, including exercising regularly, eating a healthy diet, watching your weight, and checking your blood pressure and blood sugar levels.  Get screened for cancer and depression. Make sure that you are up to date with all your vaccines. This information is not intended to replace advice given to you by your health care provider. Make sure you discuss any questions you have with your health care provider. Document Revised: 02/21/2018 Document Reviewed: 02/21/2018 Elsevier Patient Education  2020 [employer].

## 2019-09-03 NOTE — Progress Notes (Signed)
Subjective:    Patient ID: Kelly Mata, female    DOB: 10/15/1950, 69 y.o.   MRN: [id number]  CC: Kelly Mata is a 69 y.o. female who presents today for physical exam.    HPI: Feels well today  No new complaints  Leg pain resolved since off the amlodipine   HTN- at home , 113/75, 116/77, 121/85, 116/73, 127/80, 148/81.  hasnt taken amlodpine as needed as hasnt needed needed.   No cp, sob       Hepatic adenoma-repeat MRI in 6 months.  Follow Dr. Allen Norris  Colorectal Cancer Screening: UTD , patient reported in 2016; no record in chart. States 10 year repeat Breast Cancer Screening: Mammogram UTD History of left breast cancer, status post lumpectomy 2009, radiation   cervical Cancer Screening: History of hysterectomy. No cervix per patient.  Bone Health screening/DEXA for 65+: Due; declines this year.  Lung Cancer Screening: Doesn't have 30 year pack year history and age > 32 years. Immunizations       Tetanus - utd        Pneumococcal - Complete  Labs: Screening labs done prior Exercise: Gets regular exercise.  Alcohol use: occassional Smoking/tobacco use: Nonsmoker.  Wears seat belt: Yes. History of squamous cell carcinoma- follows with dermatology  HISTORY:  Past Medical History:  Diagnosis Date  . Allergy   . Breast cancer ([facility]) 2009   neg  . Cancer [facility]) 2009   left breast DCIS, 5 yrs tamoxifen  . Headache   . Heart murmur 2008  . Personal history of radiation therapy   . Squamous cell carcinoma     Past Surgical History:  Procedure Laterality Date  . ABDOMINAL HYSTERECTOMY  1998   had for noncancerous reasons, had fibroids. No ovaries. States no cervix.   Marland Kitchen BREAST BIOPSY Left 2009   stereo breast biopsy, +  . BREAST CYST ASPIRATION  90s  . BREAST EXCISIONAL BIOPSY Left 2008   neg  . BREAST EXCISIONAL BIOPSY Left 1998  . BREAST LUMPECTOMY Left 2009   radiation  . COLONOSCOPY  2013  . OOPHORECTOMY    . TONSILLECTOMY  1957   Family History   Problem Relation Age of Onset  . Cancer Other        cousin, breast  . Cancer Father        pancreatic (died at age 109)  . Breast cancer Cousin 44  . Cancer Mother        skin - unsure of type but thinks it was melanoma (died at age 35)  . Cancer Maternal Aunt   . Diabetes Daughter       ALLERGIES: Ceclor [cefaclor] and Ampicillin  Current Outpatient Medications on File Prior to Visit  Medication Sig Dispense Refill  . Ascorbic Acid (VITAMIN C PO) Take by mouth daily.    Marland Kitchen BIOTIN PO Take by mouth daily.    . Cyanocobalamin (VITAMIN B12 PO) Take by mouth daily.    Marland Kitchen HAWTHORN PO Take 5 capsules by mouth daily.    Marland Kitchen ibuprofen (ADVIL,MOTRIN) 200 MG tablet Take 200 mg by mouth every 6 (six) hours as needed.    Marland Kitchen VITAMIN D PO Take by mouth daily.     No current facility-administered medications on file prior to visit.    Social History   Tobacco Use  . Smoking status: Never Smoker  . Smokeless tobacco: Never Used  Vaping Use  . Vaping Use: Never used  Substance Use Topics  .  Alcohol use: Yes    Comment: 5 glasses of wine  . Drug use: No    Review of Systems  Constitutional: Negative for chills, fever and unexpected weight change.  HENT: Negative for congestion.   Respiratory: Negative for cough.   Cardiovascular: Negative for chest pain, palpitations and leg swelling.  Gastrointestinal: Negative for nausea and vomiting.  Genitourinary: Negative for vaginal bleeding and vaginal pain.  Musculoskeletal: Negative for arthralgias and myalgias.  Skin: Negative for rash.  Neurological: Negative for headaches.  Hematological: Negative for adenopathy.  Psychiatric/Behavioral: Negative for confusion.      Objective:    BP 125/85   Pulse 83   Temp 97.8 F (36.6 C) (Temporal)   Ht 5' 0.83" (1.545 m)   Wt 159 lb 6.4 oz (72.3 kg)   SpO2 99%   BMI 30.29 kg/m   BP Readings from Last 3 Encounters:  09/03/19 125/85  07/23/19 138/82  07/17/19 (!) 152/86   Wt Readings  from Last 3 Encounters:  09/03/19 159 lb 6.4 oz (72.3 kg)  09/02/19 161 lb (73 kg)  07/23/19 161 lb 3.2 oz (73.1 kg)    Physical Exam Vitals reviewed.  Constitutional:      Appearance: She is well-developed.  Eyes:     Conjunctiva/sclera: Conjunctivae normal.  Neck:     Thyroid: No thyroid mass or thyromegaly.  Cardiovascular:     Rate and Rhythm: Normal rate and regular rhythm.     Pulses: Normal pulses.     Heart sounds: Normal heart sounds.  Pulmonary:     Effort: Pulmonary effort is normal.     Breath sounds: Normal breath sounds. No wheezing, rhonchi or rales.  Chest:     Breasts: Breasts are symmetrical.        Right: No inverted nipple, mass, nipple discharge, skin change or tenderness.        Left: No inverted nipple, mass, nipple discharge, skin change or tenderness.  Lymphadenopathy:     Head:     Right side of head: No submental, submandibular, tonsillar, preauricular, posterior auricular or occipital adenopathy.     Left side of head: No submental, submandibular, tonsillar, preauricular, posterior auricular or occipital adenopathy.     Cervical: No cervical adenopathy.     Right cervical: No superficial, deep or posterior cervical adenopathy.    Left cervical: No superficial, deep or posterior cervical adenopathy.  Skin:    General: Skin is warm and dry.  Neurological:     Mental Status: She is alert.  Psychiatric:        Speech: Speech normal.        Behavior: Behavior normal.        Thought Content: Thought content normal.        Assessment & Plan:   Problem List Items Addressed This Visit      Cardiovascular and Mediastinum   HTN (hypertension)    Well-controlled off amlodipine at this time.  We will continue to monitor        Other   Routine physical examination - Primary    Clinical breast exam performed today.  Patient politely declines pelvic exam in the absence of complaints and she states she has no cervix and is no longer doing cervical  cancer screening.      Relevant Orders   Lipid panel       I am having [name]. Molden maintain her ibuprofen, HAWTHORN PO, Ascorbic Acid (VITAMIN C PO), VITAMIN D PO, Cyanocobalamin (VITAMIN B12  PO), and BIOTIN PO.   No orders of the defined types were placed in this encounter.   Return precautions given.   Risks, benefits, and alternatives of the medications and treatment plan prescribed today were discussed, and patient expressed understanding.   Education regarding symptom management and diagnosis given to patient on AVS.   Continue to follow with Burnard Hawthorne, FNP for routine health maintenance.   Kelly Mata and I agreed with plan.   Mable Paris, FNP

## 2019-09-03 NOTE — Assessment & Plan Note (Signed)
Well-controlled off amlodipine at this time.  We will continue to monitor

## 2019-09-12 ENCOUNTER — Encounter: Payer: Self-pay | Admitting: Family

## 2019-09-12 NOTE — Telephone Encounter (Signed)
Called patient to discuss patient message. Kelly Mata states she mentioned it to the provider but was trying to ignore the pain during her most recent visit. She states she has been experiencing this pain since April. Kelly Mata rates the pain to a 6 on the pain scale.  She states she is experiencing muscle fatigue and burning. Sharp pain shoots through limbs but it is worse on her left side. Advised patient to go to the Urgent care or ER if she feels any distress or intense discomfort. Scheduled appointment with Kelly Mata on 09/13/19.

## 2019-09-13 ENCOUNTER — Encounter: Payer: Self-pay | Admitting: Nurse Practitioner

## 2019-09-13 ENCOUNTER — Ambulatory Visit (INDEPENDENT_AMBULATORY_CARE_PROVIDER_SITE_OTHER): Payer: PPO | Admitting: Nurse Practitioner

## 2019-09-13 ENCOUNTER — Other Ambulatory Visit: Payer: Self-pay

## 2019-09-13 VITALS — BP 132/84 | HR 73 | Temp 98.5°F | Ht 61.0 in | Wt 156.0 lb

## 2019-09-13 DIAGNOSIS — M7918 Myalgia, other site: Secondary | ICD-10-CM

## 2019-09-13 NOTE — Patient Instructions (Addendum)
For your migratory muscle aches- I have added labs to your next Wed draw- 09/18/2019.   Please see Joycelyn Schmid in follow- up.   Continue with your journal.      Musculoskeletal Pain Musculoskeletal pain refers to aches and pains in your bones, joints, muscles, and the tissues that surround them. This pain can occur in any part of the body. It can last for a short time (acute) or a long time (chronic). A physical exam, lab tests, and imaging studies may be done to find the cause of your musculoskeletal pain. Follow these instructions at home:  Lifestyle  Try to control or lower your stress levels. Stress increases muscle tension and can worsen musculoskeletal pain. It is important to recognize when you are anxious or stressed and learn ways to manage it. This may include: ? Meditation or yoga. ? Cognitive or behavioral therapy. ? Acupuncture or massage therapy.  You may continue all activities unless the activities cause more pain. When the pain gets better, slowly resume your normal activities. Gradually increase the intensity and duration of your activities or exercise. Managing pain, stiffness, and swelling  Take over-the-counter and prescription medicines only as told by your health care provider.  When your pain is severe, bed rest may be helpful. Lie or sit in any position that is comfortable, but get out of bed and walk around at least every couple of hours.  If directed, apply heat to the affected area as often as told by your health care provider. Use the heat source that your health care provider recommends, such as a moist heat pack or a heating pad. ? Place a towel between your skin and the heat source. ? Leave the heat on for 20-30 minutes. ? Remove the heat if your skin turns bright red. This is especially important if you are unable to feel pain, heat, or cold. You may have a greater risk of getting burned.  If directed, put ice on the painful area. ? Put ice in a plastic  bag. ? Place a towel between your skin and the bag. ? Leave the ice on for 20 minutes, 2-3 times a day. General instructions  Your health care provider may recommend that you see a physical therapist. This person can help you come up with a safe exercise program. Do any exercises as told by your physical therapist.  Keep all follow-up visits, including any physical therapy visits, as told by your health care providers. This is important. Contact a health care provider if:  Your pain gets worse.  Medicines do not help ease your pain.  You cannot use the part of your body that hurts, such as your arm, leg, or neck.  You have trouble sleeping.  You have trouble doing your normal activities. Get help right away if:  You have a new injury and your pain is worse or different.  You feel numb or you have tingling in the painful area. Summary  Musculoskeletal pain refers to aches and pains in your bones, joints, muscles, and the tissues that surround them.  This pain can occur in any part of the body.  Your health care provider may recommend that you see a physical therapist. This person can help you come up with a safe exercise program. Do any exercises as told by your physical therapist.  Lower your stress level. Stress can worsen musculoskeletal pain. Ways to lower stress may include meditation, yoga, cognitive or behavioral therapy, acupuncture, and massage therapy. This information is  not intended to replace advice given to you by your health care provider. Make sure you discuss any questions you have with your health care provider. Document Revised: 02/10/2017 Document Reviewed: 03/30/2016 Elsevier Patient Education  2020 [employer].

## 2019-09-13 NOTE — Progress Notes (Signed)
Established Patient Office Visit  Subjective:  Patient ID: Kelly Mata, female    DOB: 12-Jun-1950  Age: 69 y.o. MRN: [id number]  CC:  Chief Complaint  Patient presents with  . Acute Visit    bilateral arm and leg pain    HPI Kelly Mata is a 69 yo with a 3 month history of intermittent arm and leg pain.  She experienced discomfort for about 4 days in April, 5 days in May and 6 days in the month of June.  She has isolated areas on her arms and legs where they feel achy and weak and then sometimes a sharp pain.  It is  not the entire arm or leg. She does not feel this in her trunk. Most recently, she felt this 2 days ago as a sharp pain in right lower leg, left upper arm and back of neck yest. She feels limp with this is pain. Arms feel weak. She has no idea why she feels this way.  She is keeping a pain journal.   Sometimes walking helps her legs.  She wonders about fibromyalgia.  She is retired and reports no stress.  She lives with her husband and reports a really good stable relationship.  She does have a family history of Sjogren's in her son.  She denies fevers, chills, night sweats, unintentional weight loss, headaches,  joint pain, rashes, insect bites or tick bites.    Past Medical History:  Diagnosis Date  . Allergy   . Breast cancer ([place]) 2009   neg  . Cancer [facility]) 2009   left breast DCIS, 5 yrs tamoxifen  . Headache   . Heart murmur 2008  . Personal history of radiation therapy   . Squamous cell carcinoma     Past Surgical History:  Procedure Laterality Date  . ABDOMINAL HYSTERECTOMY  1998   had for noncancerous reasons, had fibroids. No ovaries. States no cervix.   Marland Kitchen BREAST BIOPSY Left 2009   stereo breast biopsy, +  . BREAST CYST ASPIRATION  90s  . BREAST EXCISIONAL BIOPSY Left 2008   neg  . BREAST EXCISIONAL BIOPSY Left 1998  . BREAST LUMPECTOMY Left 2009   radiation  . COLONOSCOPY  2013  . OOPHORECTOMY    . TONSILLECTOMY  1957    Family History   Problem Relation Age of Onset  . Cancer Other        cousin, breast  . Cancer Father        pancreatic (died at age 73)  . Breast cancer Cousin 27  . Cancer Mother        skin - unsure of type but thinks it was melanoma (died at age 62)  . Cancer Maternal Aunt   . Diabetes Daughter     Social History   Socioeconomic History  . Marital status: Married    Spouse name: Not on file  . Number of children: 2  . Years of education: 16  . Highest education level: Bachelor's degree (e.g., BA, AB, BS)  Occupational History  . Occupation: Retired Corporate treasurer  Tobacco Use  . Smoking status: Never Smoker  . Smokeless tobacco: Never Used  Vaping Use  . Vaping Use: Never used  Substance and Sexual Activity  . Alcohol use: Yes    Comment: 5 glasses of wine  . Drug use: No  . Sexual activity: Not on file  Other Topics Concern  . Not on file  Social History Narrative   Graphic  designer   Retired      Married    2 children      Right-handed.   Caffeine use:  1 cup per day.   Social Determinants of Health   Financial Resource Strain:   . Difficulty of Paying Living Expenses:   Food Insecurity:   . Worried About Charity fundraiser in the Last Year:   . Arboriculturist in the Last Year:   Transportation Needs:   . Film/video editor (Medical):   Marland Kitchen Lack of Transportation (Non-Medical):   Physical Activity:   . Days of Exercise per Week:   . Minutes of Exercise per Session:   Stress:   . Feeling of Stress :   Social Connections:   . Frequency of Communication with Friends and Family:   . Frequency of Social Gatherings with Friends and Family:   . Attends Religious Services:   . Active Member of Clubs or Organizations:   . Attends Archivist Meetings:   Marland Kitchen Marital Status:   Intimate Partner Violence:   . Fear of Current or Ex-Partner:   . Emotionally Abused:   Marland Kitchen Physically Abused:   . Sexually Abused:     Outpatient Medications Prior to Visit    Medication Sig Dispense Refill  . amLODipine (NORVASC) 10 MG tablet Take 10 mg by mouth as needed.    . Ascorbic Acid (VITAMIN C PO) Take by mouth daily.    Marland Kitchen BIOTIN PO Take by mouth daily.    . Cyanocobalamin (VITAMIN B12 PO) Take by mouth daily.    Marland Kitchen HAWTHORN PO Take 5 capsules by mouth daily.    Marland Kitchen ibuprofen (ADVIL,MOTRIN) 200 MG tablet Take 200 mg by mouth every 6 (six) hours as needed.    Marland Kitchen VITAMIN D PO Take by mouth daily.     No facility-administered medications prior to visit.    Allergies  Allergen Reactions  . Ceclor [Cefaclor] Hives  . Ampicillin Rash    ROS Review of Systems  Constitutional: Positive for fatigue. Negative for chills, fever and unexpected weight change.  HENT: Negative for congestion, sinus pain and sore throat.   Eyes: Negative.   Respiratory: Negative for cough and shortness of breath.   Cardiovascular: Negative for chest pain, palpitations and leg swelling.  Gastrointestinal: Negative.   Endocrine: Negative.   Musculoskeletal: Positive for arthralgias and myalgias. Negative for back pain, joint swelling, neck pain and neck stiffness.  Allergic/Immunologic: Negative.   Neurological: Negative for dizziness, seizures, light-headedness and headaches.  Hematological: Negative.   Psychiatric/Behavioral:       She reports no concerns with depression or anxiety.      Objective:    Physical Exam Vitals reviewed.  Constitutional:      Appearance: Normal appearance.  HENT:     Head: Normocephalic and atraumatic.  Eyes:     Conjunctiva/sclera: Conjunctivae normal.     Pupils: Pupils are equal, round, and reactive to light.  Cardiovascular:     Rate and Rhythm: Normal rate and regular rhythm.     Pulses: Normal pulses.     Heart sounds: Normal heart sounds.  Pulmonary:     Effort: Pulmonary effort is normal.     Breath sounds: Normal breath sounds.  Abdominal:     Palpations: Abdomen is soft.     Tenderness: There is no abdominal tenderness.   Musculoskeletal:        General: Normal range of motion.     Cervical back:  Normal range of motion and neck supple.  Skin:    General: Skin is warm and dry.  Neurological:     General: No focal deficit present.     Mental Status: She is alert and oriented to person, place, and time.  Psychiatric:        Mood and Affect: Mood normal.        Behavior: Behavior normal.        Thought Content: Thought content normal.        Judgment: Judgment normal.     BP 132/84 (BP Location: Left Arm, Patient Position: Sitting, Cuff Size: Normal)   Pulse 73   Temp 98.5 F (36.9 C) (Oral)   Ht 5\' 1"  (1.549 m)   Wt 156 lb (70.8 kg)   SpO2 99%   BMI 29.48 kg/m  Wt Readings from Last 3 Encounters:  09/13/19 156 lb (70.8 kg)  09/03/19 159 lb 6.4 oz (72.3 kg)  09/02/19 161 lb (73 kg)     There are no preventive care reminders to display for this patient.  There are no preventive care reminders to display for this patient.  Lab Results  Component Value Date   TSH 3.35 07/17/2019   Lab Results  Component Value Date   WBC 6.2 07/17/2019   HGB 12.4 07/17/2019   HCT 37.3 07/17/2019   MCV 92.3 07/17/2019   PLT 257.0 07/17/2019   Lab Results  Component Value Date   NA 140 07/17/2019   K 4.2 07/17/2019   CO2 30 07/17/2019   GLUCOSE 108 (H) 07/17/2019   BUN 14 07/17/2019   CREATININE 0.87 07/17/2019   BILITOT 0.3 07/17/2019   ALKPHOS 73 07/17/2019   AST 15 07/17/2019   ALT 13 07/17/2019   PROT 6.7 07/17/2019   ALBUMIN 4.2 07/17/2019   CALCIUM 9.6 07/17/2019   ANIONGAP 11 04/16/2019   GFR 64.57 07/17/2019   Lab Results  Component Value Date   CHOL 200 08/31/2018   Lab Results  Component Value Date   HDL 66.20 08/31/2018   Lab Results  Component Value Date   LDLCALC 117 (H) 08/31/2018   Lab Results  Component Value Date   TRIG 83.0 08/31/2018   Lab Results  Component Value Date   CHOLHDL 3 08/31/2018   Lab Results  Component Value Date   HGBA1C 5.6 08/31/2018       Assessment & Plan:   Problem List Items Addressed This Visit    None    Visit Diagnoses    Muscle ache of extremity    -  Primary   Relevant Orders   Sedimentation rate   C-reactive protein   Rheumatoid Factor   B. burgdorfi antibodies   Lyme Ab/Western Blot Reflex   Lactate Dehydrogenase (LDH)     No orders of the defined types were placed in this encounter. For your migratory muscle aches- I have added labs to your next Wed draw- 09/18/2019.   Please see Joycelyn Schmid in follow- up.   Continue with your journal.   Follow-up: Return in about 2 weeks (around 09/27/2019).   This visit occurred during the SARS-CoV-2 public health emergency.  Safety protocols were in place, including screening questions prior to the visit, additional usage of staff PPE, and extensive cleaning of exam room while observing appropriate contact time as indicated for disinfecting solutions.   Denice Paradise, NP

## 2019-09-16 ENCOUNTER — Encounter: Payer: Self-pay | Admitting: Nurse Practitioner

## 2019-09-18 ENCOUNTER — Other Ambulatory Visit (INDEPENDENT_AMBULATORY_CARE_PROVIDER_SITE_OTHER): Payer: PPO

## 2019-09-18 ENCOUNTER — Other Ambulatory Visit: Payer: Self-pay

## 2019-09-18 DIAGNOSIS — M7918 Myalgia, other site: Secondary | ICD-10-CM

## 2019-09-18 DIAGNOSIS — Z0000 Encounter for general adult medical examination without abnormal findings: Secondary | ICD-10-CM

## 2019-09-18 LAB — LIPID PANEL
Cholesterol: 167 mg/dL (ref 0–200)
HDL: 61 mg/dL (ref >39.00)
LDL Cholesterol: 90 mg/dL (ref 0–99)
NonHDL: 105.94
Total CHOL/HDL Ratio: 3
Triglycerides: 81 mg/dL (ref 0.0–149.0)
VLDL: 16.2 mg/dL (ref 0.0–40.0)

## 2019-09-18 NOTE — Addendum Note (Signed)
Addended by: Leeanne Rio on: 09/18/2019 08:50 AM   Modules accepted: Orders

## 2019-09-19 LAB — LYME AB/WESTERN BLOT REFLEX
LYME DISEASE AB, QUANT, IGM: <0.8 index (ref 0.00–0.79)
Lyme IgG/IgM Ab: <0.91 {ISR} (ref 0.00–0.90)

## 2019-09-20 ENCOUNTER — Encounter: Payer: Self-pay | Admitting: Family

## 2019-09-21 DIAGNOSIS — A6920 Lyme disease, unspecified: Secondary | ICD-10-CM | POA: Diagnosis not present

## 2019-09-24 LAB — B. BURGDORFI ANTIBODIES BY WB

## 2019-09-24 LAB — SEDIMENTATION RATE: Sed Rate: 11 mm/h (ref 0–30)

## 2019-09-24 LAB — C-REACTIVE PROTEIN: CRP: 3.5 mg/L (ref <8.0)

## 2019-09-24 LAB — LYME AB SCREEN %: Lyme AB Screen: 1.16 index — ABNORMAL HIGH

## 2019-09-24 LAB — RHEUMATOID FACTOR: Rheumatoid fact SerPl-aCnc: <14 IU/mL (ref <14)

## 2019-09-24 LAB — LACTATE DEHYDROGENASE: LDH: 130 U/L (ref 120–250)

## 2019-10-03 ENCOUNTER — Other Ambulatory Visit: Payer: Self-pay

## 2019-10-07 ENCOUNTER — Ambulatory Visit (INDEPENDENT_AMBULATORY_CARE_PROVIDER_SITE_OTHER): Payer: PPO | Admitting: Family

## 2019-10-07 ENCOUNTER — Other Ambulatory Visit: Payer: Self-pay

## 2019-10-07 VITALS — BP 140/80 | HR 101 | Temp 98.2°F | Ht 60.98 in | Wt 159.6 lb

## 2019-10-07 DIAGNOSIS — M2550 Pain in unspecified joint: Secondary | ICD-10-CM | POA: Insufficient documentation

## 2019-10-07 MED ORDER — GABAPENTIN 100 MG PO CAPS: 100.0000 mg | ORAL_CAPSULE | Freq: Every evening | ORAL | 3 refills | Status: DC | PRN

## 2019-10-07 NOTE — Progress Notes (Signed)
Subjective:    Patient ID: Kelly Mata, female    DOB: 1950-09-04, 69 y.o.   MRN: [id number]  CC: Kelly Mata is a 69 y.o. female who presents today for follow up.   HPI: Complains of burning sensation on outer lateral upper arms and also lateral lower legs, 5 months ago, waxes and wanes, some improvement over the past month. No pain today.   No fever, joint or leg swelling, rash, changes in weight, nights sweats.  Had a couple of occasions when bilateral hands would feel numb. Otherwise, no other numbness, vibration of skin, or tingling.   Has been keeping journal.  Onset correlates to covid vaccine.  When weather changes, feels may be trigger. Walking may make better. No pain in calves with walking.    HTN- will uses amlodipine prn. At home every other day, 96/60' average per 121/73  No cp, sob    False positive lyme test  HISTORY:  Past Medical History:  Diagnosis Date  . Allergy   . Breast cancer ([place]) 2009   neg  . Cancer [facility]) 2009   left breast DCIS, 5 yrs tamoxifen  . Headache   . Heart murmur 2008  . Personal history of radiation therapy   . Squamous cell carcinoma    Past Surgical History:  Procedure Laterality Date  . ABDOMINAL HYSTERECTOMY  1998   had for noncancerous reasons, had fibroids. No ovaries. States no cervix.   Marland Kitchen BREAST BIOPSY Left 2009   stereo breast biopsy, +  . BREAST CYST ASPIRATION  90s  . BREAST EXCISIONAL BIOPSY Left 2008   neg  . BREAST EXCISIONAL BIOPSY Left 1998  . BREAST LUMPECTOMY Left 2009   radiation  . COLONOSCOPY  2013  . OOPHORECTOMY    . TONSILLECTOMY  1957   Family History  Problem Relation Age of Onset  . Cancer Other        cousin, breast  . Cancer Father        pancreatic (died at age 19)  . Breast cancer Cousin 62  . Cancer Mother        skin - unsure of type but thinks it was melanoma (died at age 87)  . Cancer Maternal Aunt   . Diabetes Daughter     Allergies: Ceclor [cefaclor] and  Ampicillin Current Outpatient Medications on File Prior to Visit  Medication Sig Dispense Refill  . amLODipine (NORVASC) 10 MG tablet Take 10 mg by mouth as needed.    . Ascorbic Acid (VITAMIN C PO) Take by mouth daily.    Marland Kitchen BIOTIN PO Take by mouth daily.    . Cyanocobalamin (VITAMIN B12 PO) Take by mouth daily.    Marland Kitchen HAWTHORN PO Take 5 capsules by mouth daily.    Marland Kitchen ibuprofen (ADVIL,MOTRIN) 200 MG tablet Take 200 mg by mouth every 6 (six) hours as needed.    Marland Kitchen VITAMIN D PO Take by mouth daily.    Marland Kitchen doxycycline (VIBRAMYCIN) 100 MG capsule Take 100 mg by mouth 2 (two) times daily.     No current facility-administered medications on file prior to visit.    Social History   Tobacco Use  . Smoking status: Never Smoker  . Smokeless tobacco: Never Used  Vaping Use  . Vaping Use: Never used  Substance Use Topics  . Alcohol use: Yes    Comment: 5 glasses of wine  . Drug use: No    Review of Systems  Constitutional: Negative for chills, fever  and unexpected weight change.  Respiratory: Negative for cough.   Cardiovascular: Negative for chest pain and palpitations.  Gastrointestinal: Negative for nausea and vomiting.  Musculoskeletal: Positive for arthralgias. Negative for joint swelling.  Skin: Negative for rash.      Objective:    BP (!) 140/80   Pulse 101   Temp 98.2 F (36.8 C)   Ht 5' 0.98" (1.549 m)   Wt 159 lb 9.6 oz (72.4 kg)   SpO2 97%   BMI 30.17 kg/m  BP Readings from Last 3 Encounters:  10/07/19 (!) 140/80  09/13/19 132/84  09/03/19 125/85   Wt Readings from Last 3 Encounters:  10/07/19 159 lb 9.6 oz (72.4 kg)  09/13/19 156 lb (70.8 kg)  09/03/19 159 lb 6.4 oz (72.3 kg)    Physical Exam Vitals reviewed.  Constitutional:      Appearance: She is well-developed.  Eyes:     Conjunctiva/sclera: Conjunctivae normal.  Cardiovascular:     Rate and Rhythm: Normal rate and regular rhythm.     Pulses: Normal pulses.     Heart sounds: Normal heart sounds.   Pulmonary:     Effort: Pulmonary effort is normal.     Breath sounds: Normal breath sounds. No wheezing, rhonchi or rales.  Skin:    General: Skin is warm and dry.  Neurological:     Mental Status: She is alert.  Psychiatric:        Speech: Speech normal.        Behavior: Behavior normal.        Thought Content: Thought content normal.        Assessment & Plan:   Problem List Items Addressed This Visit      Other   Arthralgia - Primary    Some improvement over the past month, duration 5 months.  Reviewed prior CT neck and PET scans in 2019 which raised concern for possible cervical, lumbar stenosis playing a role.  Reviewed recent lab work.  Normal B12, folate.  Negative rheumatoid factor.  Have added on  ANA, cyclic peptide for completion. Advised x-ray cervical spine, lumbar x-ray.  Patient declines.  Discussed neurology consult, patient politely declines as symptom improved.  She will let me know of any exacerbation , new, symptoms , or if symptom doesn't continue to improve so I may place referral.Gabapentin given for prn.       Relevant Medications   gabapentin (NEURONTIN) 100 MG capsule   Other Relevant Orders   ANA   CYCLIC CITRUL PEPTIDE ANTIBODY, IGG/IGA       I am having Kelly Mata start on gabapentin. I am also having Kelly Mata maintain Kelly Mata ibuprofen, HAWTHORN PO, Ascorbic Acid (VITAMIN C PO), VITAMIN D PO, Cyanocobalamin (VITAMIN B12 PO), BIOTIN PO, amLODipine, and doxycycline.   Meds ordered this encounter  Medications  . gabapentin (NEURONTIN) 100 MG capsule    Sig: Take 1 capsule (100 mg total) by mouth at bedtime as needed.    Dispense:  90 capsule    Refill:  3    Order Specific Question:   Supervising Provider    Answer:   Kelly Mata [[id number]]    Return precautions given.   Risks, benefits, and alternatives of the medications and treatment plan prescribed today were discussed, and patient expressed understanding.   Education regarding symptom  management and diagnosis given to patient on AVS.  Continue to follow with Kelly Hawthorne, FNP for routine health maintenance.   Kelly Mata and  I agreed with plan.   Kelly Paris, FNP

## 2019-10-07 NOTE — Patient Instructions (Signed)
Let me know how you are doing

## 2019-10-07 NOTE — Assessment & Plan Note (Addendum)
Some improvement over the past month, duration 5 months.  Reviewed prior CT neck and PET scans in 2019 which raised concern for possible cervical, lumbar stenosis playing a role.  Reviewed recent lab work.  Normal B12, folate.  Negative rheumatoid factor.  Have added on  ANA, cyclic peptide for completion. Advised x-ray cervical spine, lumbar x-ray.  Patient declines.  Discussed neurology consult, patient politely declines as symptom improved.  She will let me know of any exacerbation , new, symptoms , or if symptom doesn't continue to improve so I may place referral.Gabapentin given for prn.

## 2019-10-08 LAB — ANA: Anti Nuclear Antibody (ANA): NEGATIVE

## 2019-10-09 LAB — CYCLIC CITRUL PEPTIDE ANTIBODY, IGG/IGA: Cyclic Citrullin Peptide Ab: 5 units (ref 0–19)

## 2019-11-06 IMAGING — CT CT HEAD W/O CM
4 of 7 series · 16 of 47 positions shown, 17 images · non-contrast
Comparison: None

CLINICAL DATA: Headache for 18 days with neck stiffness, history of
LEFT breast cancer 9 years ago post lumpectomy and radiation therapy

EXAM:
CT HEAD WITHOUT CONTRAST
CT CERVICAL SPINE WITHOUT CONTRAST
TECHNIQUE: Multidetector CT imaging of the head and cervical spine was
performed following the standard protocol without intravenous
contrast. Multiplanar CT image reconstructions of the cervical spine
were also generated.

[Series 3: head wo · axial · 0.47mm/px · z∈[-57,+18]mm · 3 of 31 slices shown, 4 images]
[im 8/31  brain]
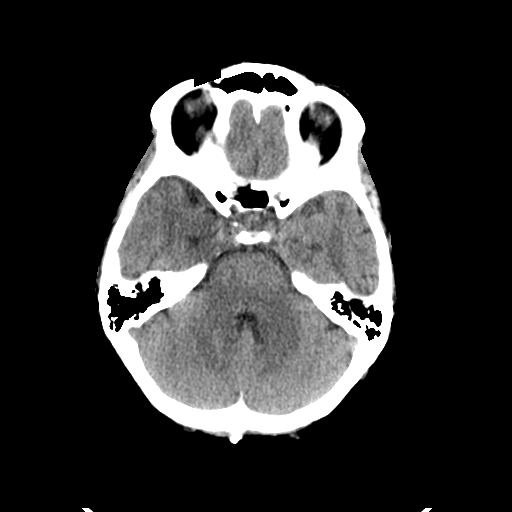
[im 8/31  bone]
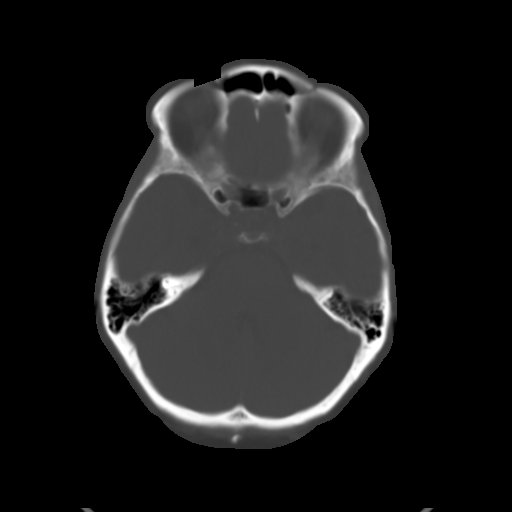
[im 16/31  brain]
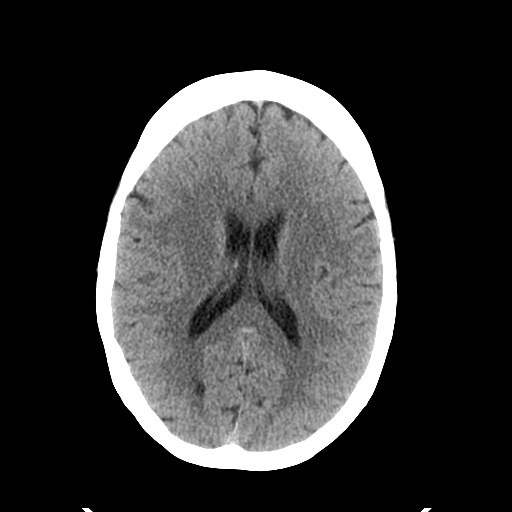
[im 23/31  brain]
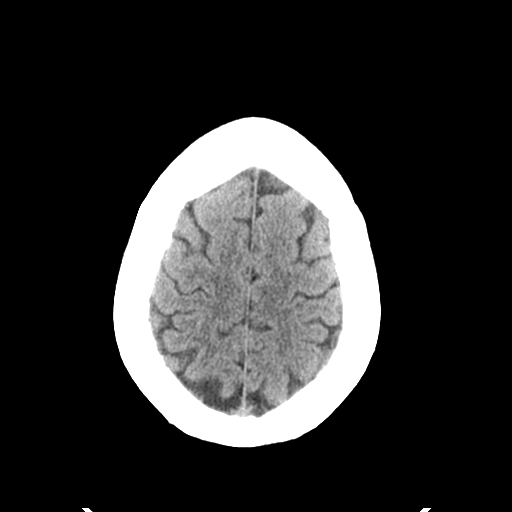

[Series 9: coronal soft tissue · coronal · 0.32mm/px · 3 of 80 slices shown]
[im 16/80  brain]
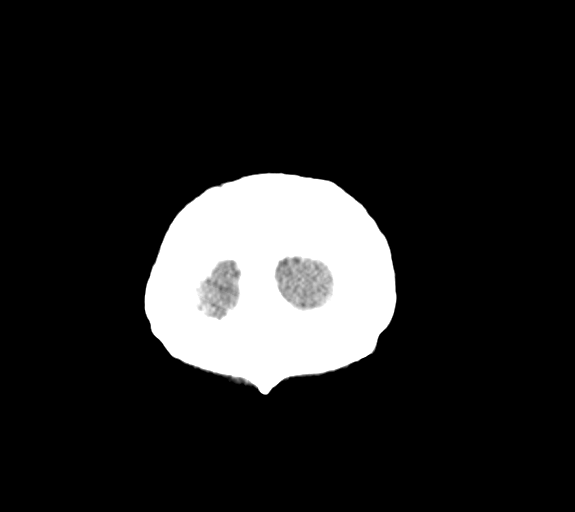
[im 32/80  brain]
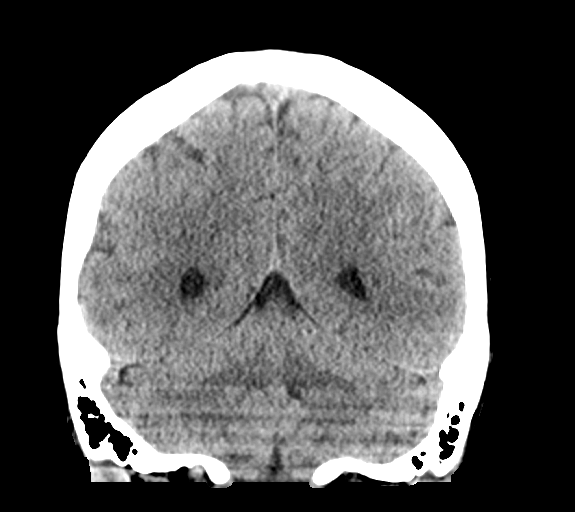
[im 48/80  brain]
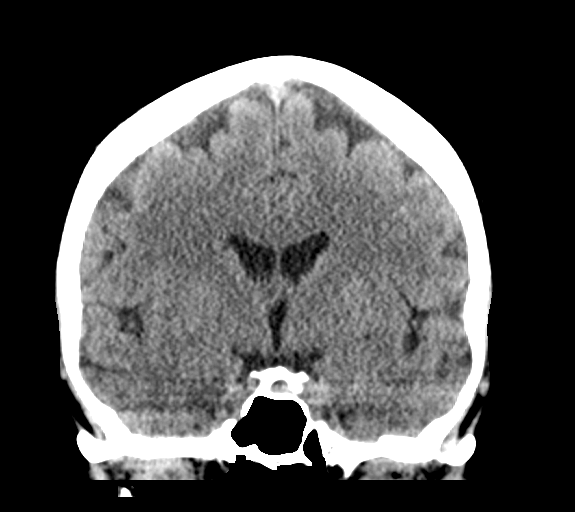

[Series 10: sagittal soft tissue · sagittal · 0.34mm/px · 2 of 64 slices shown]
[im 22/64  brain]
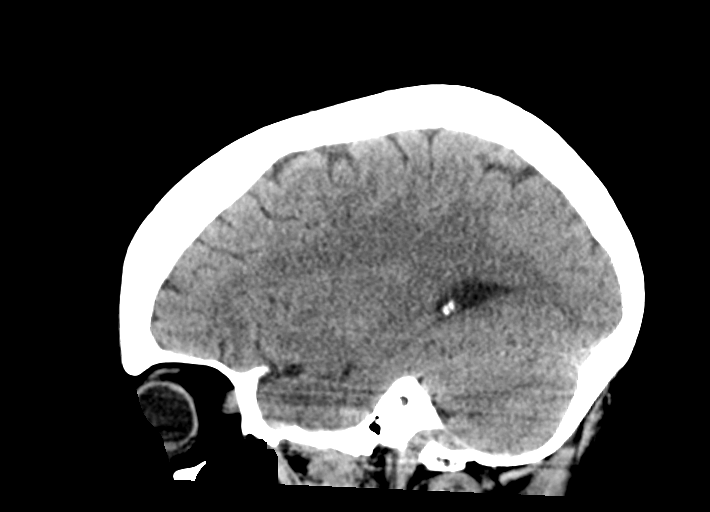
[im 43/64  brain]
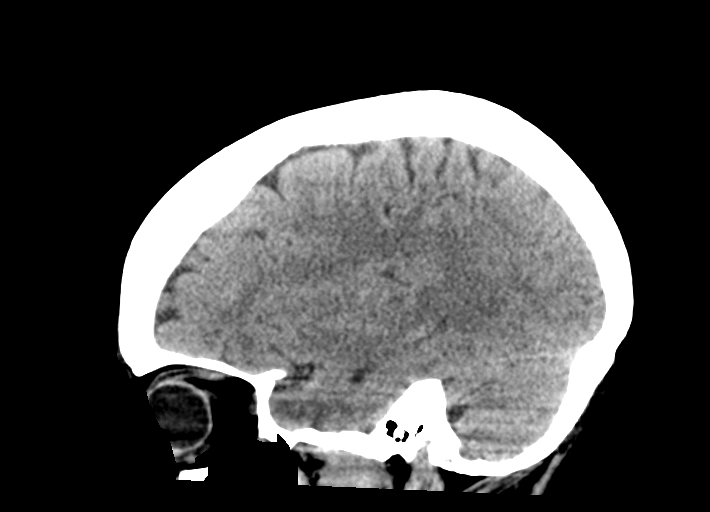

[Series 13: orthogonal bone · axial · 0.23mm/px · z∈[-246,-111]mm · 8 of 85 slices shown]
[im 8/85  bone]
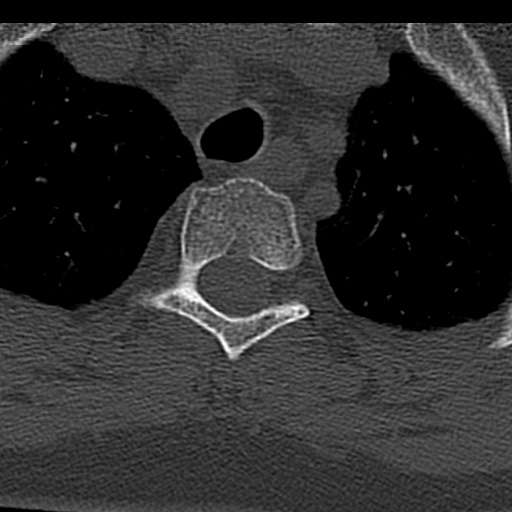
[im 22/85  bone]
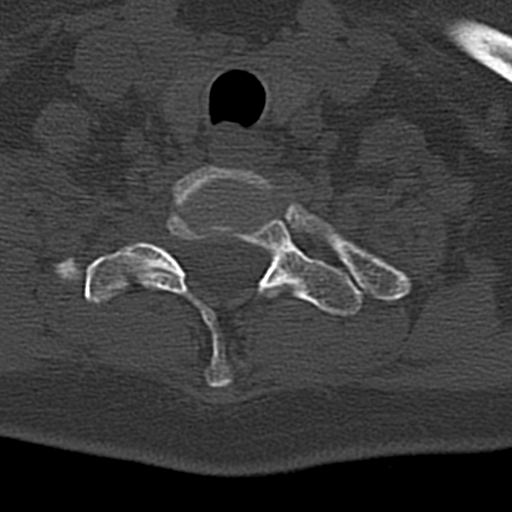
[im 29/85  bone]
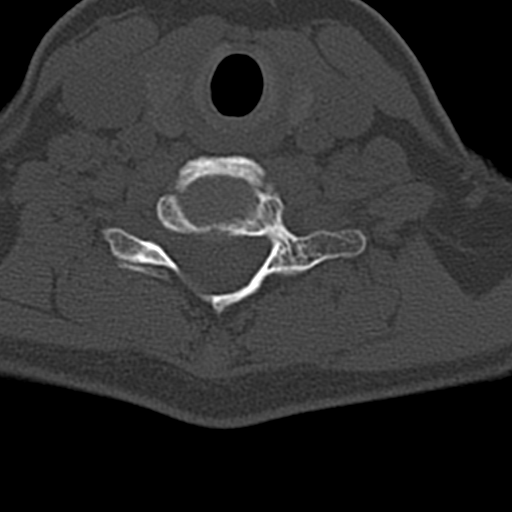
[im 36/85  bone]
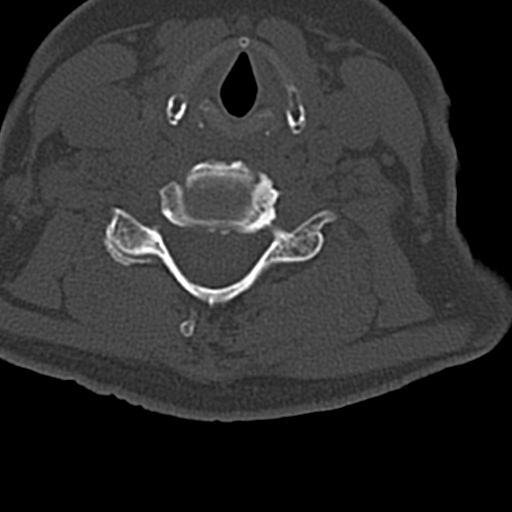
[im 50/85  bone]
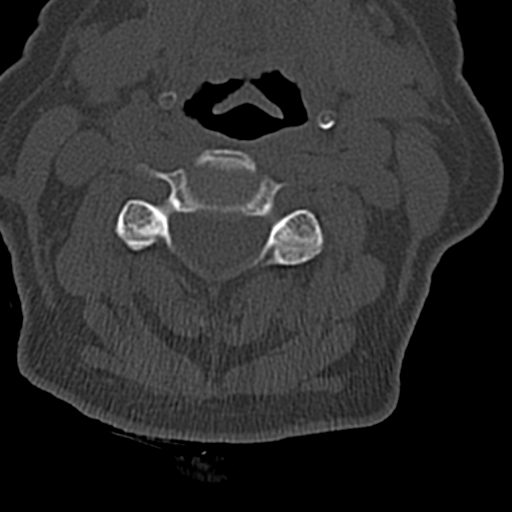
[im 57/85  bone]
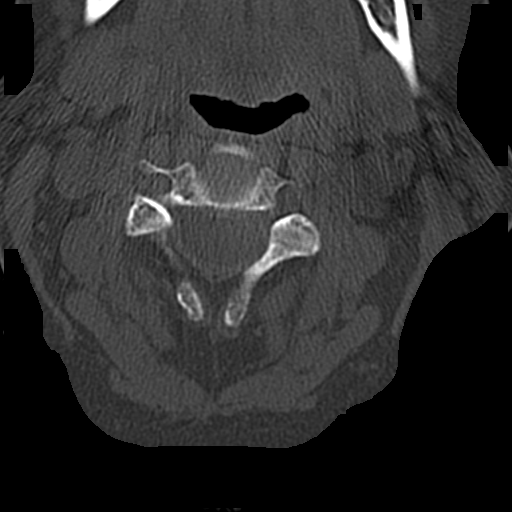
[im 64/85  bone]
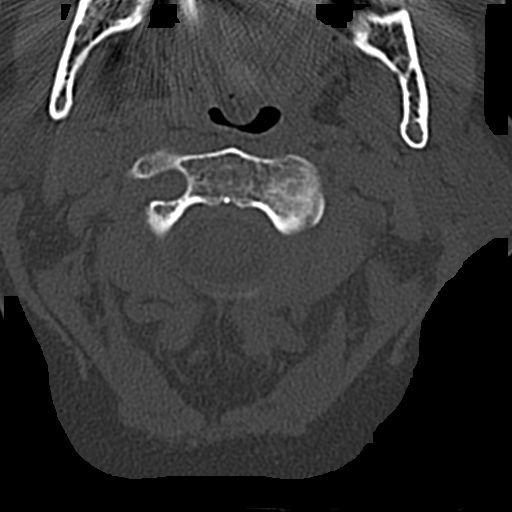
[im 78/85  bone]
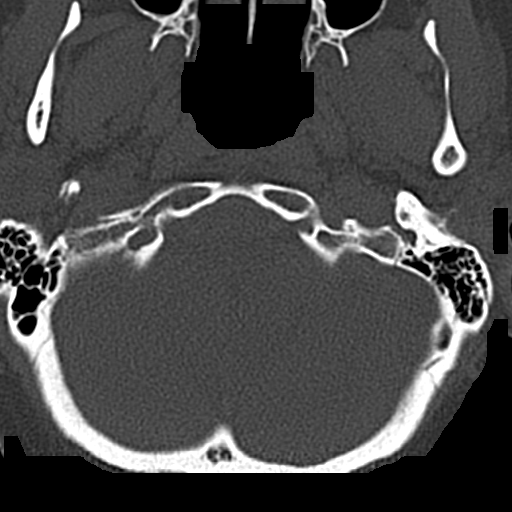

[16 of 47 positions shown; findings below may reference images not displayed]

FINDINGS: CT HEAD FINDINGS

Brain: Normal ventricular morphology. No midline shift or mass
effect. Normal appearance of brain parenchyma. No intracranial
hemorrhage, mass lesion, or evidence of acute infarction. No
extra-axial fluid collections.

Vascular: Unremarkable

Skull: Osseous mineralization normal. Two small lucent foci are
identified in the LEFT parietal bone, larger measuring 10 mm
diameter, nonspecific and of uncertain acuity.

Sinuses/Orbits: Clear

Other: N/A

CT CERVICAL SPINE FINDINGS

Alignment: Normal

Skull base and vertebrae: Osseous mineralization normal. Visualized
skull base intact. Vertebral body heights maintained. No fracture,
subluxation or bone destruction.

Soft tissues and spinal canal: Prevertebral soft tissues normal
thickness. Remaining visualized cervical soft tissues unremarkable.

Disc levels: Disc space narrowing with endplate spur formation at
C5-C6 and C6-C7. Small uncovertebral spurs encroach upon the LEFT
cervical neural foramen at C5-C6.

Upper chest: Lung apices clear

Other: N/A
IMPRESSION: No acute intracranial abnormalities.

Degenerative disc disease changes at C5-C6 and C6-C7 as above.

No acute cervical spine abnormalities.

Two small ill-defined lytic foci identified within LEFT parietal
bone, larger lesion 10 mm diameter.

These are nonspecific in appearance and of uncertain acuity due to
lack of prior exams.

While these may be benign in a etiology, in light of the history of
breast cancer consider radionuclide bone scan including lateral
views of the calvarium to exclude lytic osseous metastases.

## 2019-11-07 ENCOUNTER — Other Ambulatory Visit: Payer: Self-pay

## 2019-11-07 ENCOUNTER — Ambulatory Visit
Admission: RE | Admit: 2019-11-07 | Discharge: 2019-11-07 | Disposition: A | Discharge status: Home / Self Care | Payer: PPO | Source: Ambulatory Visit | Attending: Emergency Medicine | Admitting: Emergency Medicine

## 2019-11-07 VITALS — BP 138/84 | HR 81 | Temp 98.1°F | Resp 14

## 2019-11-07 DIAGNOSIS — N390 Urinary tract infection, site not specified: Secondary | ICD-10-CM | POA: Insufficient documentation

## 2019-11-07 DIAGNOSIS — R319 Hematuria, unspecified: Secondary | ICD-10-CM | POA: Diagnosis not present

## 2019-11-07 LAB — POCT URINALYSIS DIP (MANUAL ENTRY)
Bilirubin, UA: NEGATIVE
Glucose, UA: NEGATIVE mg/dL
Ketones, POC UA: NEGATIVE mg/dL
Nitrite, UA: POSITIVE — AB
Protein Ur, POC: NEGATIVE mg/dL
Spec Grav, UA: 1.01 (ref 1.010–1.025)
Urobilinogen, UA: 0.2 E.U./dL
pH, UA: 5 (ref 5.0–8.0)

## 2019-11-07 MED ORDER — SULFAMETHOXAZOLE-TRIMETHOPRIM 800-160 MG PO TABS: 1.0000 | ORAL_TABLET | Freq: Two times a day (BID) | ORAL | 0 refills | Status: AC

## 2019-11-07 NOTE — ED Provider Notes (Signed)
Kelly Mata    CSN: [id number] Arrival date & time: 11/07/19  6440      History   Chief Complaint Chief Complaint  Patient presents with  . Hematuria  . Urinary Frequency  . Urinary Urgency    HPI Kelly Mata is a 69 y.o. female.   Patient presents with dysuria, frequency, and hematuria since last night.  She took Azo last night and has increased her water intake.  She denies fever, chills, abdominal pain, back pain, vaginal discharge, pelvic pain, or other symptoms.  The history is provided by the patient.    Past Medical History:  Diagnosis Date  . Allergy   . Breast cancer ([place]) 2009   neg  . Cancer [facility]) 2009   left breast DCIS, 5 yrs tamoxifen  . Headache   . Heart murmur 2008  . Personal history of radiation therapy   . Squamous cell carcinoma     Patient Active Problem List   Diagnosis Date Noted  . Arthralgia 10/07/2019  . Arthritis 07/23/2019  . Genital herpes 07/23/2019  . Migraine 07/23/2019  . Leg pain, bilateral 07/17/2019  . HTN (hypertension) 04/26/2019  . Liver lesion 04/26/2019  . Dysuria 04/04/2018  . Diarrhea 02/23/2018  . Atherosclerosis of aorta ([place]) 08/21/2017  . Nonintractable headache 08/16/2017  . 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 Skull lesion 06/22/2017  . Intractable headache 06/19/2017  . Chronic pain of left knee 01/02/2017  . Routine physical examination 06/30/2016  . Tremor 06/23/2016  . Dizziness 06/23/2016    Past Surgical History:  Procedure Laterality Date  . ABDOMINAL HYSTERECTOMY  1998   had for noncancerous reasons, had fibroids. No ovaries. States no cervix.   Marland Kitchen BREAST BIOPSY Left 2009   stereo breast biopsy, +  . BREAST CYST ASPIRATION  90s  . BREAST EXCISIONAL BIOPSY Left 2008   neg  . BREAST EXCISIONAL BIOPSY Left 1998  . BREAST LUMPECTOMY Left 2009   radiation  . COLONOSCOPY  2013  . OOPHORECTOMY    . TONSILLECTOMY  1957     OB History    Gravida  3   Para      Term      Preterm      AB  1   Living  2     SAB      TAB  1   Ectopic      Multiple      Live Births           Obstetric Comments  Age with first menstruation-14 Age with first pregnancy-27 LMP-1998, hysterectomy         Home Medications    Prior to Admission medications   Medication Sig Start Date End Date Taking? Authorizing Provider  amLODipine (NORVASC) 10 MG tablet Take 10 mg by mouth as needed.    [provider]  Ascorbic Acid (VITAMIN C PO) Take by mouth daily.    [provider]  BIOTIN PO Take by mouth daily.    [provider]  Cyanocobalamin (VITAMIN B12 PO) Take by mouth daily.    [provider]  doxycycline (VIBRAMYCIN) 100 MG capsule Take 100 mg by mouth 2 (two) times daily. 09/21/19   [provider]  gabapentin (NEURONTIN) 100 MG capsule Take 1 capsule (100 mg total) by mouth at bedtime as needed. 10/07/19   Burnard Hawthorne, FNP  HAWTHORN PO Take 5 capsules by  mouth daily.    [provider]  ibuprofen (ADVIL,MOTRIN) 200 MG tablet Take 200 mg by mouth every 6 (six) hours as needed.    [provider]  sulfamethoxazole-trimethoprim (BACTRIM DS) 800-160 MG tablet Take 1 tablet by mouth 2 (two) times daily for 7 days. 11/07/19 11/14/19  Sharion Balloon, NP  VITAMIN D PO Take by mouth daily.    [provider]    Family History Family History  Problem Relation Age of Onset  . Cancer Other        cousin, breast  . Cancer Father        pancreatic (died at age 53)  . Breast cancer Cousin 13  . Cancer Mother        skin - unsure of type but thinks it was melanoma (died at age 80)  . Cancer Maternal Aunt   . Diabetes Daughter     Social History Social History   Tobacco Use  . Smoking status: Never Smoker  . Smokeless tobacco: Never Used  Vaping Use  . Vaping Use: Never used  Substance Use Topics  . Alcohol use: Yes     Comment: 5 glasses of wine  . Drug use: No     Allergies   Ceclor [cefaclor] and Ampicillin   Review of Systems Review of Systems  Constitutional: Negative for chills and fever.  HENT: Negative for ear pain and sore throat.   Eyes: Negative for pain and visual disturbance.  Respiratory: Negative for cough and shortness of breath.   Cardiovascular: Negative for chest pain and palpitations.  Gastrointestinal: Negative for abdominal pain and vomiting.  Genitourinary: Positive for dysuria and frequency. Negative for flank pain, hematuria, pelvic pain and vaginal discharge.  Musculoskeletal: Negative for arthralgias and back pain.  Skin: Negative for color change and rash.  Neurological: Negative for seizures and syncope.  All other systems reviewed and are negative.    Physical Exam Triage Vital Signs ED Triage Vitals  Enc Vitals Group     BP      Pulse      Resp      Temp      Temp src      SpO2      Weight      Height      Head Circumference      Peak Flow      Pain Score      Pain Loc      Pain Edu?      Excl. in [place]?    No data found.  Updated Vital Signs BP 138/84   Pulse 81   Temp 98.1 F (36.7 C)   Resp 14   SpO2 98%   Visual Acuity Right Eye Distance:   Left Eye Distance:   Bilateral Distance:    Right Eye Near:   Left Eye Near:    Bilateral Near:     Physical Exam Vitals and nursing note reviewed.  Constitutional:      General: She is not in acute distress.    Appearance: She is well-developed.  HENT:     Head: Normocephalic and atraumatic.  Eyes:     Conjunctiva/sclera: Conjunctivae normal.  Cardiovascular:     Rate and Rhythm: Normal rate and regular rhythm.     Heart sounds: No murmur heard.   Pulmonary:     Effort: Pulmonary effort is normal. No respiratory distress.     Breath sounds: Normal breath sounds.  Abdominal:  Palpations: Abdomen is soft.     Tenderness: There is no abdominal tenderness.  Musculoskeletal:      Cervical back: Neck supple.  Skin:    General: Skin is warm and dry.  Neurological:     Mental Status: She is alert.      UC Treatments / Results  Labs (all labs ordered are listed, but only abnormal results are displayed) Labs Reviewed  POCT URINALYSIS DIP (MANUAL ENTRY) - Abnormal; Notable for the following components:      Result Value   Color, UA orange (*)    Blood, UA large (*)    Nitrite, UA Positive (*)    Leukocytes, UA Small (1+) (*)    All other components within normal limits  URINE CULTURE    EKG   Radiology No results found.  Procedures Procedures (including critical care time)  Medications Ordered in UC Medications - No data to display  Initial Impression / Assessment and Plan / UC Course  I have reviewed the triage vital signs and the nursing notes.  Pertinent labs & imaging results that were available during my care of the patient were reviewed by me and considered in my medical decision making (see chart for details).   UTI with hematuria.  Treating with Bactrim.  Urine culture pending. Discussed with patient that we will call her if the urine culture shows the need to change or discontinue the antibiotic. Instructed her to follow-up with her PCP if her symptoms are not improving. Patient agrees to plan of care.      Final Clinical Impressions(s) / UC Diagnoses   Final diagnoses:  Urinary tract infection with hematuria, site unspecified     Discharge Instructions     Take the antibiotic as directed.  The urine culture is pending.  We will call you if it shows the need to change or discontinue your antibiotic.    Follow up with your primary care provider if your symptoms are not improving.       ED Prescriptions    Medication Sig Dispense Auth. Provider   sulfamethoxazole-trimethoprim (BACTRIM DS) 800-160 MG tablet Take 1 tablet by mouth 2 (two) times daily for 7 days. 14 tablet Sharion Balloon, NP     PDMP not reviewed this encounter.    Sharion Balloon, NP 11/07/19 1046

## 2019-11-07 NOTE — ED Triage Notes (Signed)
Patient complains of hematuria and dysuria. Reports it started last night.

## 2019-11-07 NOTE — Discharge Instructions (Signed)
Take the antibiotic as directed.  The urine culture is pending.  We will call you if it shows the need to change or discontinue your antibiotic.    Follow up with your primary care provider if your symptoms are not improving.    

## 2019-11-09 LAB — URINE CULTURE: Culture: 40000 — AB

## 2019-12-04 DIAGNOSIS — D2362 Other benign neoplasm of skin of left upper limb, including shoulder: Secondary | ICD-10-CM | POA: Diagnosis not present

## 2019-12-04 DIAGNOSIS — L821 Other seborrheic keratosis: Secondary | ICD-10-CM | POA: Diagnosis not present

## 2019-12-04 DIAGNOSIS — Z08 Encounter for follow-up examination after completed treatment for malignant neoplasm: Secondary | ICD-10-CM | POA: Diagnosis not present

## 2019-12-04 DIAGNOSIS — Z85828 Personal history of other malignant neoplasm of skin: Secondary | ICD-10-CM | POA: Diagnosis not present

## 2020-02-09 ENCOUNTER — Encounter: Payer: Self-pay | Admitting: Family

## 2020-02-10 ENCOUNTER — Telehealth: Payer: Self-pay | Admitting: Gastroenterology

## 2020-02-10 ENCOUNTER — Other Ambulatory Visit: Payer: Self-pay

## 2020-02-10 DIAGNOSIS — K769 Liver disease, unspecified: Secondary | ICD-10-CM

## 2020-02-11 ENCOUNTER — Other Ambulatory Visit: Payer: Self-pay

## 2020-02-11 DIAGNOSIS — Z01812 Encounter for preprocedural laboratory examination: Secondary | ICD-10-CM | POA: Diagnosis not present

## 2020-02-12 LAB — CREATININE, SERUM
Creatinine, Ser: 0.87 mg/dL (ref 0.57–1.00)
GFR calc Af Amer: 79 mL/min/{1.73_m2} (ref >59)
GFR calc non Af Amer: 68 mL/min/{1.73_m2} (ref >59)

## 2020-02-16 ENCOUNTER — Other Ambulatory Visit: Payer: Self-pay

## 2020-02-16 ENCOUNTER — Ambulatory Visit
Admission: RE | Admit: 2020-02-16 | Discharge: 2020-02-16 | Disposition: A | Discharge status: Home / Self Care | Payer: PPO | Source: Ambulatory Visit | Attending: Gastroenterology | Admitting: Gastroenterology

## 2020-02-16 DIAGNOSIS — D1771 Benign lipomatous neoplasm of kidney: Secondary | ICD-10-CM | POA: Diagnosis not present

## 2020-02-16 DIAGNOSIS — K769 Liver disease, unspecified: Secondary | ICD-10-CM | POA: Diagnosis not present

## 2020-02-16 DIAGNOSIS — C50919 Malignant neoplasm of unspecified site of unspecified female breast: Secondary | ICD-10-CM | POA: Diagnosis not present

## 2020-02-16 MED ORDER — GADOXETATE DISODIUM 0.25 MMOL/ML IV SOLN
7.0000 mL | Freq: Once | INTRAVENOUS | Status: AC | PRN
  Administered 2020-02-16: 7 mL via INTRAVENOUS

## 2020-02-19 ENCOUNTER — Other Ambulatory Visit: Payer: Self-pay

## 2020-02-19 ENCOUNTER — Telehealth: Payer: Self-pay

## 2020-02-19 DIAGNOSIS — R1011 Right upper quadrant pain: Secondary | ICD-10-CM

## 2020-02-19 NOTE — Telephone Encounter (Signed)
Pt notified of MRI results

## 2020-02-19 NOTE — Telephone Encounter (Signed)
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  Adenomas need yearly checking while focal nodular hyperplasia does not.  Since it is equivocal I would recommend following up with another MRI in a year

## 2020-02-25 ENCOUNTER — Other Ambulatory Visit: Payer: Self-pay

## 2020-02-25 ENCOUNTER — Telehealth: Payer: Self-pay

## 2020-02-25 ENCOUNTER — Ambulatory Visit
Admission: RE | Admit: 2020-02-25 | Discharge: 2020-02-25 | Disposition: A | Discharge status: Home / Self Care | Payer: PPO | Source: Ambulatory Visit | Attending: Gastroenterology | Admitting: Gastroenterology

## 2020-02-25 DIAGNOSIS — R1011 Right upper quadrant pain: Secondary | ICD-10-CM

## 2020-02-25 NOTE — Telephone Encounter (Signed)
Pt notified of US results via mychart.  

## 2020-02-25 NOTE — Telephone Encounter (Signed)
-----   Message from Lucilla Lame, MD sent at 02/25/2020  4:07 PM EST ----- Let the patient know that the ultrasound showed no acute abnormalities in the previously seen lesion on the liver was not seen on this exam although this is not the best test to look at liver lesions

## 2020-02-26 NOTE — Telephone Encounter (Signed)
open encounter in error 

## 2020-03-04 ENCOUNTER — Other Ambulatory Visit: Payer: Self-pay

## 2020-03-04 ENCOUNTER — Ambulatory Visit (INDEPENDENT_AMBULATORY_CARE_PROVIDER_SITE_OTHER): Payer: PPO | Admitting: Family

## 2020-03-04 ENCOUNTER — Encounter: Payer: Self-pay | Admitting: Family

## 2020-03-04 VITALS — BP 156/82 | HR 88 | Temp 98.1°F | Ht 60.98 in | Wt 157.0 lb

## 2020-03-04 DIAGNOSIS — I159 Secondary hypertension, unspecified: Secondary | ICD-10-CM

## 2020-03-04 DIAGNOSIS — Z1231 Encounter for screening mammogram for malignant neoplasm of breast: Secondary | ICD-10-CM | POA: Diagnosis not present

## 2020-03-04 DIAGNOSIS — M2550 Pain in unspecified joint: Secondary | ICD-10-CM

## 2020-03-04 DIAGNOSIS — K769 Liver disease, unspecified: Secondary | ICD-10-CM

## 2020-03-04 MED ORDER — HYDROCHLOROTHIAZIDE 12.5 MG PO CAPS: 12.5000 mg | ORAL_CAPSULE | Freq: Every day | ORAL | 1 refills | Status: DC | PRN

## 2020-03-04 NOTE — Assessment & Plan Note (Addendum)
Resolved completely. She declines further evaluation.

## 2020-03-04 NOTE — Assessment & Plan Note (Signed)
Uncontrolled. Blood pressure is labile. Advised to have PRN approach as average at home , 124/77, is acceptable. I have given her HCTZ 12.5mg  to take PRN > 130/80. She understands if she starts to need/use daily that she will need baseline labs, electrolytes approx q79months.

## 2020-03-04 NOTE — Progress Notes (Signed)
Subjective:    Patient ID: Kelly Mata, female    DOB: 1950-08-26, 69 y.o.   MRN: [id number]  CC: Kelly Mata is a 69 y.o. female who presents today for follow up.   HPI: Feels well today.  Greatest is concern is caring for husband who recently fell and is unable to drive, walk, cook. Otherwise she feels well.   Arm pain, lower leg pain resolved.  3-4 weeks ago had RUQ abdominal pain, normal RUQ [place]. No further pain. No fever, nausea, vomiting.   HTN-BP at home is 124/77. She not taking amlodipine as caused arm and leg pain. Noticed direct correlation with amlodipine and has since stopped. No cp. No leg swelling however she takes hawthorne supplement for this and thinks helping.   Followed up with Dr [facility] Norris for 1.3cm liver lesion;  MR 02/17/20 which requires repeat MRI in one year and she is aware to follow up with him.   Follows with Dr Diamantina Providence for angiomyolipoma; follow up scheduled in april    HISTORY:  Past Medical History:  Diagnosis Date  . Allergy   . Breast cancer ([place]) 2009   neg  . Cancer [facility]) 2009   left breast DCIS, 5 yrs tamoxifen  . Headache   . Heart murmur 2008  . Personal history of radiation therapy   . Squamous cell carcinoma    Past Surgical History:  Procedure Laterality Date  . ABDOMINAL HYSTERECTOMY  1998   had for noncancerous reasons, had fibroids. No ovaries. States no cervix.   Marland Kitchen BREAST BIOPSY Left 2009   stereo breast biopsy, +  . BREAST CYST ASPIRATION  90s  . BREAST EXCISIONAL BIOPSY Left 2008   neg  . BREAST EXCISIONAL BIOPSY Left 1998  . BREAST LUMPECTOMY Left 2009   radiation  . COLONOSCOPY  2013  . OOPHORECTOMY    . TONSILLECTOMY  1957   Family History  Problem Relation Age of Onset  . Cancer Other        cousin, breast  . Cancer Father        pancreatic (died at age 35)  . Breast cancer Cousin 40  . Cancer Mother        skin - unsure of type but thinks it was melanoma (died at age 106)  . Cancer Maternal Aunt   .  Diabetes Daughter     Allergies: Amlodipine, Ceclor [cefaclor], and Ampicillin Current Outpatient Medications on File Prior to Visit  Medication Sig Dispense Refill  . Ascorbic Acid (VITAMIN C PO) Take by mouth daily.    Marland Kitchen BIOTIN PO Take by mouth daily.    . Cyanocobalamin (VITAMIN B12 PO) Take by mouth daily.    Marland Kitchen HAWTHORN PO Take 5 capsules by mouth daily.    Marland Kitchen ibuprofen (ADVIL,MOTRIN) 200 MG tablet Take 200 mg by mouth every 6 (six) hours as needed.    Marland Kitchen VITAMIN D PO Take by mouth daily.     No current facility-administered medications on file prior to visit.    Social History   Tobacco Use  . Smoking status: Never Smoker  . Smokeless tobacco: Never Used  Vaping Use  . Vaping Use: Never used  Substance Use Topics  . Alcohol use: Yes    Comment: 5 glasses of wine  . Drug use: No    Review of Systems  Constitutional: Negative for chills and fever.  Respiratory: Negative for cough.   Cardiovascular: Negative for chest pain and palpitations.  Gastrointestinal: Negative  for nausea and vomiting.  Musculoskeletal: Negative for arthralgias (resolved).      Objective:    BP (!) 156/82   Pulse 88   Temp 98.1 F (36.7 C)   Ht 5' 0.98" (1.549 m)   Wt 157 lb (71.2 kg)   SpO2 98%   BMI 29.68 kg/m  BP Readings from Last 3 Encounters:  03/04/20 (!) 156/82  11/07/19 138/84  10/07/19 (!) 140/80   Wt Readings from Last 3 Encounters:  03/04/20 157 lb (71.2 kg)  10/07/19 159 lb 9.6 oz (72.4 kg)  09/13/19 156 lb (70.8 kg)    Physical Exam Vitals reviewed.  Constitutional:      Appearance: She is well-developed and well-nourished.  Eyes:     Conjunctiva/sclera: Conjunctivae normal.  Cardiovascular:     Rate and Rhythm: Normal rate and regular rhythm.     Pulses: Normal pulses.     Heart sounds: Normal heart sounds.  Pulmonary:     Effort: Pulmonary effort is normal.     Breath sounds: Normal breath sounds. No wheezing, rhonchi or rales.  Skin:    General: Skin is  warm and dry.  Neurological:     Mental Status: She is alert.  Psychiatric:        Mood and Affect: Mood and affect normal.        Speech: Speech normal.        Behavior: Behavior normal.        Thought Content: Thought content normal.        Assessment & Plan:   Problem List Items Addressed This Visit      Cardiovascular and Mediastinum   HTN (hypertension)    Uncontrolled. Blood pressure is labile. Advised to have PRN approach as average at home , 124/77, is acceptable. I have given her HCTZ 12.5mg  to take PRN > 130/80. She understands if she starts to need/use daily that she will need baseline labs, electrolytes approx q69months.      Relevant Medications   hydrochlorothiazide (MICROZIDE) 12.5 MG capsule   Other Relevant Orders   Basic metabolic panel     Other   Arthralgia    Resolved completely. She declines further evaluation.      Liver lesion    Followed up with Dr [facility] Norris for 1.3cm liver lesion;  MR abdomen 02/17/20 which requires repeat MRI in one year and she is aware to follow up with him.        Other Visit Diagnoses    Encounter for screening mammogram for malignant neoplasm of breast    -  Primary   Relevant Orders   MM 3D SCREEN BREAST BILATERAL   DG Bone Density       I have discontinued [name]. Casella's amLODipine, doxycycline, and gabapentin. I am also having her start on hydrochlorothiazide. Additionally, I am having her maintain her ibuprofen, HAWTHORN PO, Ascorbic Acid (VITAMIN C PO), VITAMIN D PO, Cyanocobalamin (VITAMIN B12 PO), and BIOTIN PO.   Meds ordered this encounter  Medications  . hydrochlorothiazide (MICROZIDE) 12.5 MG capsule    Sig: Take 1 capsule (12.5 mg total) by mouth daily as needed. If Blood pressure > 130/80.    Dispense:  90 capsule    Refill:  1    Order Specific Question:   Supervising Provider    Answer:   Crecencio Mc [[id number]]    Return precautions given.   Risks, benefits, and alternatives of the medications  and treatment plan prescribed today were discussed,  and patient expressed understanding.   Education regarding symptom management and diagnosis given to patient on AVS.  Continue to follow with Burnard Hawthorne, FNP for routine health maintenance.   Kelly Mata and I agreed with plan.   Mable Paris, FNP

## 2020-03-04 NOTE — Patient Instructions (Signed)
Take hctz 12.5mg  if BP > 130/80.  Please call  and schedule your 3D mammogram, bone density scan as discussed.   [place]  Lucas [place], [place]

## 2020-03-04 NOTE — Assessment & Plan Note (Signed)
Followed up with Dr [facility] Norris for 1.3cm liver lesion;  MR abdomen 02/17/20 which requires repeat MRI in one year and she is aware to follow up with him.

## 2020-03-10 ENCOUNTER — Other Ambulatory Visit (INDEPENDENT_AMBULATORY_CARE_PROVIDER_SITE_OTHER): Payer: PPO

## 2020-03-10 ENCOUNTER — Other Ambulatory Visit: Payer: Self-pay

## 2020-03-10 DIAGNOSIS — I159 Secondary hypertension, unspecified: Secondary | ICD-10-CM | POA: Diagnosis not present

## 2020-03-10 LAB — BASIC METABOLIC PANEL
BUN: 19 mg/dL (ref 6–23)
CO2: 28 mEq/L (ref 19–32)
Calcium: 9.3 mg/dL (ref 8.4–10.5)
Chloride: 105 mEq/L (ref 96–112)
Creatinine, Ser: 0.82 mg/dL (ref 0.40–1.20)
GFR: 72.89 mL/min (ref >60.00)
Glucose, Bld: 76 mg/dL (ref 70–99)
Potassium: 4.4 mEq/L (ref 3.5–5.1)
Sodium: 138 mEq/L (ref 135–145)

## 2020-03-17 DIAGNOSIS — Z20822 Contact with and (suspected) exposure to covid-19: Secondary | ICD-10-CM | POA: Diagnosis not present

## 2020-03-27 ENCOUNTER — Telehealth: Payer: Self-pay

## 2020-03-27 NOTE — Telephone Encounter (Signed)
lvm letting pt know appt was rescheduled from 07/03/20 due to Margaret being out office. If this date and time doesn't work for patient to call back and reschedule appointment.  

## 2020-04-16 ENCOUNTER — Other Ambulatory Visit: Payer: Self-pay

## 2020-04-16 ENCOUNTER — Ambulatory Visit
Admission: RE | Admit: 2020-04-16 | Discharge: 2020-04-16 | Disposition: A | Discharge status: Home / Self Care | Payer: PPO | Source: Ambulatory Visit | Attending: Family | Admitting: Family

## 2020-04-16 DIAGNOSIS — Z13820 Encounter for screening for osteoporosis: Secondary | ICD-10-CM | POA: Insufficient documentation

## 2020-04-16 DIAGNOSIS — M8589 Other specified disorders of bone density and structure, multiple sites: Secondary | ICD-10-CM | POA: Diagnosis not present

## 2020-04-16 DIAGNOSIS — Z1231 Encounter for screening mammogram for malignant neoplasm of breast: Secondary | ICD-10-CM

## 2020-04-16 DIAGNOSIS — Z780 Asymptomatic menopausal state: Secondary | ICD-10-CM | POA: Diagnosis not present

## 2020-04-16 DIAGNOSIS — Z853 Personal history of malignant neoplasm of breast: Secondary | ICD-10-CM | POA: Insufficient documentation

## 2020-05-22 ENCOUNTER — Encounter: Payer: Self-pay | Admitting: Family

## 2020-06-08 ENCOUNTER — Ambulatory Visit
Admission: RE | Admit: 2020-06-08 | Discharge: 2020-06-08 | Disposition: A | Discharge status: Home / Self Care | Payer: PPO | Source: Ambulatory Visit | Attending: Urology | Admitting: Urology

## 2020-06-08 ENCOUNTER — Other Ambulatory Visit: Payer: Self-pay

## 2020-06-08 DIAGNOSIS — D1771 Benign lipomatous neoplasm of kidney: Secondary | ICD-10-CM | POA: Insufficient documentation

## 2020-06-08 DIAGNOSIS — N2889 Other specified disorders of kidney and ureter: Secondary | ICD-10-CM | POA: Diagnosis not present

## 2020-06-08 DIAGNOSIS — Z87442 Personal history of urinary calculi: Secondary | ICD-10-CM | POA: Diagnosis not present

## 2020-06-08 DIAGNOSIS — N200 Calculus of kidney: Secondary | ICD-10-CM | POA: Insufficient documentation

## 2020-06-08 DIAGNOSIS — R300 Dysuria: Secondary | ICD-10-CM | POA: Diagnosis not present

## 2020-06-18 ENCOUNTER — Ambulatory Visit: Payer: PPO | Admitting: Urology

## 2020-06-18 ENCOUNTER — Encounter: Payer: Self-pay | Admitting: Urology

## 2020-06-18 ENCOUNTER — Other Ambulatory Visit: Payer: Self-pay

## 2020-06-18 VITALS — BP 146/85 | HR 79 | Ht 61.0 in | Wt 156.0 lb

## 2020-06-18 DIAGNOSIS — N393 Stress incontinence (female) (male): Secondary | ICD-10-CM | POA: Diagnosis not present

## 2020-06-18 DIAGNOSIS — D1771 Benign lipomatous neoplasm of kidney: Secondary | ICD-10-CM | POA: Diagnosis not present

## 2020-06-18 NOTE — Progress Notes (Signed)
   06/18/2020 1:28 PM   Kelly Mata 03/02/1951 [id number]  Reason for visit: Follow up left AML, stress incontinence  HPI: I saw [name] in urology clinic for the above issues.  She is a healthy 70 year old female with a history of breast cancer who was found of a 1 cm left renal angiomyolipoma on MRI in February 2021.  On review of old imaging this was present over a year ago on a renal ultrasound from February 2020 and measured 1.9 cm at the time.  She is asymptomatic with no flank pain or gross hematuria.  I personally viewed and interpreted her recent renal ultrasound on 06/09/2020 that shows no hydronephrosis or stones, and stable 1.5 cm lower pole angiomyolipoma.  We reviewed these results, and the low risk of enlargement or rupture.  We discussed there are no specific guidelines regarding the duration of surveillance.  Regarding her mild stress incontinence, she reports no worsening of her symptoms.  She will only have leakage when she has a cold and has significant coughing and sneezing.  She continues to get surveillance for a liver lesion through GI, and is wondering if she can have one ultrasound or MRI to follow both of these lesions.  I think this is very reasonable.  Return precautions were discussed.  If she has any significant enlargement of the left angiomyolipoma or it is over 4 cm, please refer back to urology.   [place], [facility] [address] [place], [place] [address] [phone]

## 2020-07-03 ENCOUNTER — Ambulatory Visit: Payer: PPO | Admitting: Family

## 2020-07-06 ENCOUNTER — Other Ambulatory Visit: Payer: Self-pay

## 2020-07-06 ENCOUNTER — Encounter: Payer: Self-pay | Admitting: Family

## 2020-07-06 ENCOUNTER — Ambulatory Visit (INDEPENDENT_AMBULATORY_CARE_PROVIDER_SITE_OTHER): Payer: PPO | Admitting: Family

## 2020-07-06 VITALS — BP 118/72 | HR 68 | Temp 98.0°F | Ht 61.0 in | Wt 158.0 lb

## 2020-07-06 DIAGNOSIS — I159 Secondary hypertension, unspecified: Secondary | ICD-10-CM | POA: Diagnosis not present

## 2020-07-06 DIAGNOSIS — G8929 Other chronic pain: Secondary | ICD-10-CM | POA: Diagnosis not present

## 2020-07-06 DIAGNOSIS — K769 Liver disease, unspecified: Secondary | ICD-10-CM | POA: Diagnosis not present

## 2020-07-06 DIAGNOSIS — M25562 Pain in left knee: Secondary | ICD-10-CM

## 2020-07-06 DIAGNOSIS — D1771 Benign lipomatous neoplasm of kidney: Secondary | ICD-10-CM | POA: Diagnosis not present

## 2020-07-06 NOTE — Assessment & Plan Note (Addendum)
Chronic. Presentation c/w arthritic and/or meniscal etiology. Advised icing regimen , ace wrap. Referral to orthopedics.

## 2020-07-06 NOTE — Assessment & Plan Note (Signed)
Reiterated the importance of follow up 02/2021 with Dr [facility] Norris regarding both renal and liver lesion for surveillance. She is well aware of this.

## 2020-07-06 NOTE — Assessment & Plan Note (Signed)
Controlled. Continue prn use hctz

## 2020-07-06 NOTE — Progress Notes (Signed)
Subjective:    Patient ID: Kelly Mata, female    DOB: 04/22/1950, 70 y.o.   MRN: [id number]  CC: Kelly Mata is a 70 y.o. female who presents today for follow up.   HPI: Complains of left lateral knee pain, x 10 years, unchanged.  Limiting exercise due to pain as walking is painful.  Compliant with tumeric and prn ibuprofen with relief.  Pain after sitting for a long period of time. No increased heat. Some swelling when knee pain is more aggrevated.  No injury.   HTN- she has limiting salt and BP average 125/77.  She has used hctz 12.5mg  as needed. No cp, ha.   Consult with Dr Diamantina Providence 2 weeks ago and plans to follow with GI, Dr [facility] Norris in regards to surveillance of left angiomyolipoma of kidney No follow up with Dr [facility] Norris scheduled  HISTORY:  Past Medical History:  Diagnosis Date  . Allergy   . Breast cancer ([place]) 2009   neg  . Cancer [facility]) 2009   left breast DCIS, 5 yrs tamoxifen  . Headache   . Heart murmur 2008  . Personal history of radiation therapy   . Squamous cell carcinoma    Past Surgical History:  Procedure Laterality Date  . ABDOMINAL HYSTERECTOMY  1998   had for noncancerous reasons, had fibroids. No ovaries. States no cervix.   [facility] Kitchen BREAST BIOPSY Left 2009   stereo breast biopsy, +  . BREAST CYST ASPIRATION  90s  . BREAST EXCISIONAL BIOPSY Left 2008   neg  . BREAST EXCISIONAL BIOPSY Left 1998  . BREAST LUMPECTOMY Left 2009   radiation  . COLONOSCOPY  2013  . OOPHORECTOMY    . TONSILLECTOMY  1957   Family History  Problem Relation Age of Onset  . Cancer Other        cousin, breast  . Cancer Father        pancreatic (died at age 75)  . Breast cancer Cousin 5  . Cancer Mother        skin - unsure of type but thinks it was melanoma (died at age 90)  . Cancer Maternal Aunt   . Diabetes Daughter     Allergies: Amlodipine, Ceclor [cefaclor], and Ampicillin Current Outpatient Medications on File Prior to Visit  Medication Sig Dispense Refill   . Ascorbic Acid (VITAMIN C PO) Take by mouth daily.    Marland Kitchen BIOTIN PO Take by mouth daily.    . Cyanocobalamin (VITAMIN B12 PO) Take by mouth daily.    Marland Kitchen HAWTHORN PO Take 2 capsules by mouth daily.    . hydrochlorothiazide (MICROZIDE) 12.5 MG capsule Take 1 capsule (12.5 mg total) by mouth daily as needed. If Blood pressure > 130/80. 90 capsule 1  . ibuprofen (ADVIL,MOTRIN) 200 MG tablet Take 200 mg by mouth every 6 (six) hours as needed.    Marland Kitchen VITAMIN D PO Take 1 capsule by mouth daily. 2000 IU     No current facility-administered medications on file prior to visit.    Social History   Tobacco Use  . Smoking status: Never Smoker  . Smokeless tobacco: Never Used  Vaping Use  . Vaping Use: Never used  Substance Use Topics  . Alcohol use: Yes    Comment: 5 glasses of wine  . Drug use: No    Review of Systems  Constitutional: Negative for chills and fever.  Respiratory: Negative for cough.   Cardiovascular: Negative for chest pain and palpitations.  Gastrointestinal:  Negative for nausea and vomiting.  Musculoskeletal: Positive for arthralgias (left knee).      Objective:    BP 118/72   Pulse 68   Temp 98 F (36.7 C)   Ht 5\' 1"  (1.549 m)   Wt 158 lb (71.7 kg)   SpO2 97%   BMI 29.85 kg/m  BP Readings from Last 3 Encounters:  07/06/20 118/72  06/18/20 (!) 146/85  03/04/20 (!) 156/82   Wt Readings from Last 3 Encounters:  07/06/20 158 lb (71.7 kg)  06/18/20 156 lb (70.8 kg)  03/04/20 157 lb (71.2 kg)    Physical Exam Vitals reviewed.  Constitutional:      Appearance: She is well-developed.  Eyes:     Conjunctiva/sclera: Conjunctivae normal.  Cardiovascular:     Rate and Rhythm: Normal rate and regular rhythm.     Pulses: Normal pulses.     Heart sounds: Normal heart sounds.  Pulmonary:     Effort: Pulmonary effort is normal.     Breath sounds: Normal breath sounds. No wheezing, rhonchi or rales.  Musculoskeletal:     Right knee: No swelling or erythema.  Normal range of motion.     Left knee: No swelling, erythema or ecchymosis. Normal range of motion. Tenderness present over the medial joint line.     Comments: Left knee  No effusion appreciated. No increase in warmth or erythema.  Able to extend to -5 to 10 degrees and flex to 110 degrees. No catching with McMurray maneuver. No patellar apprehension.   No calf tenderness of lower leg edema bilaterally.    Skin:    General: Skin is warm and dry.  Neurological:     Mental Status: She is alert.  Psychiatric:        Speech: Speech normal.        Behavior: Behavior normal.        Thought Content: Thought content normal.        Assessment & Plan:   Problem List Items Addressed This Visit      Cardiovascular and Mediastinum   HTN (hypertension)    Controlled. Continue prn use hctz        Genitourinary   Angiomyolipoma of kidney     Other   Left knee pain - Primary    Chronic. Presentation c/w arthritic and/or meniscal etiology. Advised icing regimen , ace wrap. Referral to orthopedics.      Relevant Orders   Ambulatory referral to Orthopedic Surgery   Liver lesion    Reiterated the importance of follow up 02/2021 with Dr [facility] Norris regarding both renal and liver lesion for surveillance. She is well aware of this.          I am having [name]. [place] maintain her ibuprofen, HAWTHORN PO, Ascorbic Acid (VITAMIN C PO), VITAMIN D PO, Cyanocobalamin (VITAMIN B12 PO), BIOTIN PO, and hydrochlorothiazide.   No orders of the defined types were placed in this encounter.   Return precautions given.   Risks, benefits, and alternatives of the medications and treatment plan prescribed today were discussed, and patient expressed understanding.   Education regarding symptom management and diagnosis given to patient on AVS.  Continue to follow with Burnard Hawthorne, FNP for routine health maintenance.   Kelly Mata and I agreed with plan.   Mable Paris, FNP

## 2020-07-06 NOTE — Patient Instructions (Signed)
Ice and ace wrap to left knee Referral to orthopedics  Let us know if you dont hear back within a week in regards to an appointment being scheduled.

## 2020-07-09 DIAGNOSIS — M1712 Unilateral primary osteoarthritis, left knee: Secondary | ICD-10-CM | POA: Diagnosis not present

## 2020-07-10 DIAGNOSIS — R208 Other disturbances of skin sensation: Secondary | ICD-10-CM | POA: Diagnosis not present

## 2020-07-10 DIAGNOSIS — L538 Other specified erythematous conditions: Secondary | ICD-10-CM | POA: Diagnosis not present

## 2020-07-10 DIAGNOSIS — L820 Inflamed seborrheic keratosis: Secondary | ICD-10-CM | POA: Diagnosis not present

## 2020-07-10 DIAGNOSIS — D485 Neoplasm of uncertain behavior of skin: Secondary | ICD-10-CM | POA: Diagnosis not present

## 2020-07-10 DIAGNOSIS — Z789 Other specified health status: Secondary | ICD-10-CM | POA: Diagnosis not present

## 2020-09-02 ENCOUNTER — Telehealth: Payer: Self-pay

## 2020-09-02 ENCOUNTER — Ambulatory Visit (INDEPENDENT_AMBULATORY_CARE_PROVIDER_SITE_OTHER): Payer: PPO

## 2020-09-02 VITALS — Ht 61.0 in | Wt 158.0 lb

## 2020-09-02 DIAGNOSIS — Z0000 Encounter for general adult medical examination without abnormal findings: Secondary | ICD-10-CM

## 2020-09-02 NOTE — Telephone Encounter (Signed)
No answer when called for scheduled awv on preferred number. Left voicemail. Reschedule as appropriate.

## 2020-09-02 NOTE — Patient Instructions (Addendum)
Kelly Mata , Thank you for taking time to come for your Medicare Wellness Visit. I appreciate your ongoing commitment to your health goals. Please review the following plan we discussed and let me know if I can assist you in the future.   These are the goals we discussed:  Goals      Maintain Healthy Lifestyle     Stay active Stay hydrated Healthy diet         This is a list of the screening recommended for you and due dates:  Health Maintenance  Topic Date Due   COVID-19 Vaccine (4 - Booster) 10/05/2020*   Flu Shot  10/12/2020   Tetanus Vaccine  03/14/2021   Mammogram  04/16/2022   Colon Cancer Screening  03/14/2024   DEXA scan (bone density measurement)  Completed   Hepatitis C Screening: USPSTF Recommendation to screen - Ages 34-79 yo.  Completed   Pneumonia vaccines  Completed   Zoster (Shingles) Vaccine  Completed   HPV Vaccine  Aged Out  *Topic was postponed. The date shown is not the original due date.    Advanced directives: End of life planning; Advance aging; Advanced directives discussed.  Copy of current HCPOA/Living Will requested.    Conditions/risks identified: appointment scheduled for stress management  Follow up in one year for your annual wellness visit    Preventive Care 65 Years and Older, Female Preventive care refers to lifestyle choices and visits with your health care provider that can promote health and wellness. What does preventive care include? A yearly physical exam. This is also called an annual well check. Dental exams once or twice a year. Routine eye exams. Ask your health care provider how often you should have your eyes checked. Personal lifestyle choices, including: Daily care of your teeth and gums. Regular physical activity. Eating a healthy diet. Avoiding tobacco and drug use. Limiting alcohol use. Practicing safe sex. Taking low-dose aspirin every day. Taking vitamin and mineral supplements as recommended by your health  care provider. What happens during an annual well check? The services and screenings done by your health care provider during your annual well check will depend on your age, overall health, lifestyle risk factors, and family history of disease. Counseling  Your health care provider may ask you questions about your: Alcohol use. Tobacco use. Drug use. Emotional well-being. Home and relationship well-being. Sexual activity. Eating habits. History of falls. Memory and ability to understand (cognition). Work and work Statistician. Reproductive health. Screening  You may have the following tests or measurements: Height, weight, and BMI. Blood pressure. Lipid and cholesterol levels. These may be checked every 5 years, or more frequently if you are over 75 years old. Skin check. Lung cancer screening. You may have this screening every year starting at age 59 if you have a 30-pack-year history of smoking and currently smoke or have quit within the past 15 years. Fecal occult blood test (FOBT) of the stool. You may have this test every year starting at age 43. Flexible sigmoidoscopy or colonoscopy. You may have a sigmoidoscopy every 5 years or a colonoscopy every 10 years starting at age 12. Hepatitis C blood test. Hepatitis B blood test. Sexually transmitted disease (STD) testing. Diabetes screening. This is done by checking your blood sugar (glucose) after you have not eaten for a while (fasting). You may have this done every 1-3 years. Bone density scan. This is done to screen for osteoporosis. You may have this done starting at age 65. Mammogram.  This may be done every 1-2 years. Talk to your health care provider about how often you should have regular mammograms. Talk with your health care provider about your test results, treatment options, and if necessary, the need for more tests. Vaccines  Your health care provider may recommend certain vaccines, such as: Influenza vaccine. This is  recommended every year. Tetanus, diphtheria, and acellular pertussis (Tdap, Td) vaccine. You may need a Td booster every 10 years. Zoster vaccine. You may need this after age 60. Pneumococcal 13-valent conjugate (PCV13) vaccine. One dose is recommended after age 86. Pneumococcal polysaccharide (PPSV23) vaccine. One dose is recommended after age 80. Talk to your health care provider about which screenings and vaccines you need and how often you need them. This information is not intended to replace advice given to you by your health care provider. Make sure you discuss any questions you have with your health care provider. Document Released: 03/27/2015 Document Revised: 11/18/2015 Document Reviewed: 12/30/2014 Elsevier Interactive Patient Education  2017 [place] Prevention in the Home Falls can cause injuries. They can happen to people of all ages. There are many things you can do to make your home safe and to help prevent falls. What can I do on the outside of my home? Regularly fix the edges of walkways and driveways and fix any cracks. Remove anything that might make you trip as you walk through a door, such as a raised step or threshold. Trim any bushes or trees on the path to your home. Use bright outdoor lighting. Clear any walking paths of anything that might make someone trip, such as rocks or tools. Regularly check to see if handrails are loose or broken. Make sure that both sides of any steps have handrails. Any raised decks and porches should have guardrails on the edges. Have any leaves, snow, or ice cleared regularly. Use sand or salt on walking paths during winter. Clean up any spills in your garage right away. This includes oil or grease spills. What can I do in the bathroom? Use night lights. Install grab bars by the toilet and in the tub and shower. Do not use towel bars as grab bars. Use non-skid mats or decals in the tub or shower. If you need to sit down in  the shower, use a plastic, non-slip stool. Keep the floor dry. Clean up any water that spills on the floor as soon as it happens. Remove soap buildup in the tub or shower regularly. Attach bath mats securely with double-sided non-slip rug tape. Do not have throw rugs and other things on the floor that can make you trip. What can I do in the bedroom? Use night lights. Make sure that you have a light by your bed that is easy to reach. Do not use any sheets or blankets that are too big for your bed. They should not hang down onto the floor. Have a firm chair that has side arms. You can use this for support while you get dressed. Do not have throw rugs and other things on the floor that can make you trip. What can I do in the kitchen? Clean up any spills right away. Avoid walking on wet floors. Keep items that you use a lot in easy-to-reach places. If you need to reach something above you, use a strong step stool that has a grab bar. Keep electrical cords out of the way. Do not use floor polish or wax that makes floors slippery. If you  must use wax, use non-skid floor wax. Do not have throw rugs and other things on the floor that can make you trip. What can I do with my stairs? Do not leave any items on the stairs. Make sure that there are handrails on both sides of the stairs and use them. Fix handrails that are broken or loose. Make sure that handrails are as long as the stairways. Check any carpeting to make sure that it is firmly attached to the stairs. Fix any carpet that is loose or worn. Avoid having throw rugs at the top or bottom of the stairs. If you do have throw rugs, attach them to the floor with carpet tape. Make sure that you have a light switch at the top of the stairs and the bottom of the stairs. If you do not have them, ask someone to add them for you. What else can I do to help prevent falls? Wear shoes that: Do not have high heels. Have rubber bottoms. Are comfortable  and fit you well. Are closed at the toe. Do not wear sandals. If you use a stepladder: Make sure that it is fully opened. Do not climb a closed stepladder. Make sure that both sides of the stepladder are locked into place. Ask someone to hold it for you, if possible. Clearly mark and make sure that you can see: Any grab bars or handrails. First and last steps. Where the edge of each step is. Use tools that help you move around (mobility aids) if they are needed. These include: Canes. Walkers. Scooters. Crutches. Turn on the lights when you go into a dark area. Replace any light bulbs as soon as they burn out. Set up your furniture so you have a clear path. Avoid moving your furniture around. If any of your floors are uneven, fix them. If there are any pets around you, be aware of where they are. Review your medicines with your doctor. Some medicines can make you feel dizzy. This can increase your chance of falling. Ask your doctor what other things that you can do to help prevent falls. This information is not intended to replace advice given to you by your health care provider. Make sure you discuss any questions you have with your health care provider. Document Released: 12/25/2008 Document Revised: 08/06/2015 Document Reviewed: 04/04/2014 Elsevier Interactive Patient Education  2017 [employer].

## 2020-09-02 NOTE — Progress Notes (Addendum)
Subjective:   Kelly Mata is a 70 y.o. female who presents for Medicare Annual (Subsequent) preventive examination.  Review of Systems    No ROS.  Medicare Wellness Virtual Visit.  Visual/audio telehealth visit, UTA vital signs.   See social history for additional risk factors.   Cardiac Risk Factors include: advanced age (>64men, >26 women)     Objective:    Today's Vitals   09/02/20 0833  Weight: 158 lb (71.7 kg)  Height: 5\' 1"  (1.549 m)   Body mass index is 29.85 kg/m.  Advanced Directives 09/02/2020 09/02/2019 04/16/2019 08/31/2018 08/30/2017 07/11/2017 07/04/2017  Does Patient Have a Medical Advance Directive? Yes Yes No Yes Yes No No  Type of Paramedic of [place];Living will [place];Living will - [place];Living will [place];Living will - -  Does patient want to make changes to medical advance directive? No - Patient declined No - Patient declined - No - Patient declined No - Patient declined - -  Copy of [place] in Chart? No - copy requested No - copy requested - No - copy requested No - copy requested - -  Would patient like information on creating a medical advance directive? - - No - Patient declined - - - -    Current Medications (verified) Outpatient Encounter Medications as of 09/02/2020  Medication Sig   Ascorbic Acid (VITAMIN C PO) Take by mouth daily.   BIOTIN PO Take by mouth daily.   Cyanocobalamin (VITAMIN B12 PO) Take by mouth daily.   HAWTHORN PO Take 2 capsules by mouth daily.   hydrochlorothiazide (MICROZIDE) 12.5 MG capsule Take 1 capsule (12.5 mg total) by mouth daily as needed. If Blood pressure > 130/80. (Patient not taking: Reported on 09/02/2020)   ibuprofen (ADVIL,MOTRIN) 200 MG tablet Take 200 mg by mouth every 6 (six) hours as needed.   VITAMIN D PO Take 1 capsule by mouth daily. 2000 IU   No facility-administered encounter medications on  file as of 09/02/2020.    Allergies (verified) Amlodipine, Ceclor [cefaclor], and Ampicillin   History: Past Medical History:  Diagnosis Date   Allergy    Breast cancer ([place]) 2009   neg   Cancer ([place]) 2009   left breast DCIS, 5 yrs tamoxifen   Headache    Heart murmur 2008   Personal history of radiation therapy    Squamous cell carcinoma    Past Surgical History:  Procedure Laterality Date   ABDOMINAL HYSTERECTOMY  1998   had for noncancerous reasons, had fibroids. No ovaries. States no cervix.    BREAST BIOPSY Left 2009   stereo breast biopsy, +   BREAST CYST ASPIRATION  90s   BREAST EXCISIONAL BIOPSY Left 2008   neg   BREAST EXCISIONAL BIOPSY Left 1998   BREAST LUMPECTOMY Left 2009   radiation   COLONOSCOPY  2013   OOPHORECTOMY     TONSILLECTOMY  1957   Family History  Problem Relation Age of Onset   Cancer Other        cousin, breast   Cancer Father        pancreatic (died at age 47)   Breast cancer Cousin 70   Cancer Mother        skin - unsure of type but thinks it was melanoma (died at age 53)   Cancer Maternal Aunt    Diabetes Daughter    Social History   Socioeconomic History  Marital status: Married    Spouse name: Not on file   Number of children: 2   Years of education: 16   Highest education level: Bachelor's degree (e.g., BA, AB, BS)  Occupational History   Occupation: Retired Corporate treasurer  Tobacco Use   Smoking status: Never   Smokeless tobacco: Never  Vaping Use   Vaping Use: Never used  Substance and Sexual Activity   Alcohol use: Yes    Comment: 5 glasses of wine   Drug use: No   Sexual activity: Not on file  Other Topics Concern   Not on file  Social History Narrative   Corporate treasurer   Retired      Married    2 children      Right-handed.   Caffeine use:  1 cup per day.   Social Determinants of Health   Financial Resource Strain: Low Risk    Difficulty of Paying Living Expenses: Not hard at all  Food  Insecurity: No Food Insecurity   Worried About Charity fundraiser in the Last Year: Never true   [place] in the Last Year: Never true  Transportation Needs: No Transportation Needs   Lack of Transportation (Medical): No   Lack of Transportation (Non-Medical): No  Physical Activity: Not on file  Stress: Stress Concern Present   Feeling of Stress : To some extent  Social Connections: Unknown   Frequency of Communication with Friends and Family: More than three times a week   Frequency of Social Gatherings with Friends and Family: More than three times a week   Attends Religious Services: More than 4 times per year   Active Member of [employer] or Organizations: Not on file   Attends Archivist Meetings: Not on file   Marital Status: Married    Tobacco Counseling Counseling given: Not Answered   Clinical Intake:  Pre-visit preparation completed: Yes        Diabetes: No  How often do you need to have someone help you when you read instructions, pamphlets, or other written materials from your doctor or pharmacy?: 1 - Never  Interpreter Needed?: No      Activities of Daily Living In your present state of health, do you have any difficulty performing the following activities: 09/02/2020  Hearing? N  Vision? N  Difficulty concentrating or making decisions? N  Walking or climbing stairs? N  Dressing or bathing? N  Doing errands, shopping? N  Preparing Food and eating ? N  Using the Toilet? N  In the past six months, have you accidently leaked urine? Y  Comment Managed with liner  Do you have problems with loss of bowel control? N  Managing your Medications? N  Managing your Finances? N  Housekeeping or managing your Housekeeping? N  Some recent data might be hidden    Patient Care Team: Burnard Hawthorne, FNP as PCP - General (Family Medicine) Christene Lye, MD (General Surgery) Ammie Dalton, Okey Regal, MD (Unknown Physician  Specialty)  Indicate any recent Medical Services you may have received from other than [facility] providers in the past year (date may be approximate).     Assessment:   This is a routine wellness examination for Ashleah.  I connected with Jovanka today by telephone and verified that I am speaking with the correct person using two identifiers. Location patient: home Location provider: work Persons participating in the virtual visit: patient, Marine scientist.    I discussed the limitations, risks,  security and privacy concerns of performing an evaluation and management service by telephone and the availability of in person appointments. The patient expressed understanding and verbally consented to this telephonic visit.    Interactive audio and video telecommunications were attempted between this provider and patient, however failed, due to patient having technical difficulties OR patient did not have access to video capability.  We continued and completed visit with audio only.  Some vital signs may be absent or patient reported.   Hearing/Vision screen Hearing Screening - Comments:: Patient is able to hear conversational tones without difficulty.  No issues reported. Vision Screening - Comments:: Followed by [facility] Cataract extraction, bilateral They have seen their ophthalmologist in the last 12 months.   Dietary issues and exercise activities discussed: Current Exercise Habits: Home exercise routine Healthy diet Good water intake    Goals Addressed               This Visit's Progress     Patient Stated     COMPLETED: DIET - INCREASE WATER INTAKE (pt-stated)        Stay hydrated       Other     Maintain Healthy Lifestyle        Stay active Stay hydrated Healthy diet        Depression Screen PHQ 2/9 Scores 09/02/2020 03/04/2020 10/07/2019 09/03/2019 09/02/2019 06/28/2019 05/14/2019  PHQ - 2 Score 0 0 0 0 0 0 0  PHQ- 9 Score - 3 - - - 0 0    Fall Risk Fall Risk   09/02/2020 10/07/2019 09/03/2019 09/02/2019 06/28/2019  Falls in the past year? 0 0 0 0 0  Number falls in past yr: 0 0 0 0 -  Injury with Fall? 0 0 0 - -  Follow up Falls evaluation completed Falls evaluation completed Falls evaluation completed Falls evaluation completed -    FALL RISK PREVENTION PERTAINING TO THE HOME: Handrails in use when climbing stairs? YES Home free of loose throw rugs in walkways, pet beds, electrical cords, etc? Yes  Adequate lighting in your home to reduce risk of falls? Yes   ASSISTIVE DEVICES UTILIZED TO PREVENT FALLS: Life alert? No  Use of a cane, walker or w/c? No   TIMED UP AND GO: Was the test performed? No .   Cognitive Function:  Patient is alert and oriented x3.  Denies difficulty making decisions/memory loss.    6CIT Screen 09/02/2019 08/31/2018 08/30/2017  What Year? 0 points 0 points 0 points  What month? 0 points 0 points 0 points  What time? - 0 points 0 points  Count back from 20 - 0 points 0 points  Months in reverse 0 points 0 points 0 points  Repeat phrase 0 points - 0 points  Total Score - - 0    Immunizations Immunization History  Administered Date(s) Administered   Influenza-Unspecified 01/10/2017   Moderna Sars-Covid-2 Vaccination 04/07/2020   PFIZER(Purple Top)SARS-COV-2 Vaccination 05/06/2019, 05/27/2019   Pneumococcal Conjugate-13 06/30/2016   Pneumococcal Polysaccharide-23 08/30/2017   Td 08/13/2007   Zoster Recombinat (Shingrix) 11/06/2017, 02/02/2018    Health Maintenance There are no preventive care reminders to display for this patient. Health Maintenance  Topic Date Due   COVID-19 Vaccine (4 - Booster) 10/05/2020 (Originally 07/06/2020)   INFLUENZA VACCINE  10/12/2020   TETANUS/TDAP  03/14/2021   MAMMOGRAM  04/16/2022   COLONOSCOPY (Pts 45-79yrs Insurance coverage will need to be confirmed)  03/14/2024   DEXA SCAN  Completed  Hepatitis C Screening  Completed   PNA vac Low Risk Adult  Completed   Zoster  Vaccines- Shingrix  Completed   HPV VACCINES  Aged Out   Lung Cancer Screening: (Low Dose CT Chest recommended if Age 97-80 years, 30 pack-year currently smoking OR have quit w/in 15years.) does not qualify.   Dental Screening: Recommended annual dental exams for proper oral hygiene  Community Resource Referral / Chronic Care Management: CRR required this visit?  No   CCM required this visit?  No      Plan:   Keep all routine maintenance appointments.   Appointment scheduled with pcp to discuss counseling referral.   I have personally reviewed and noted the following in the patient's chart:   Medical and social history Use of alcohol, tobacco or illicit drugs  Current medications and supplements including opioid prescriptions. Patient not currently taking opioid.  Functional ability and status Nutritional status Physical activity Advanced directives List of other physicians Hospitalizations, surgeries, and ER visits in previous 12 months Vitals Screenings to include cognitive, depression, and falls Referrals and appointments  In addition, I have reviewed and discussed with patient certain preventive protocols, quality metrics, and best practice recommendations. A written personalized care plan for preventive services as well as general preventive health recommendations were provided to patient via mychart.     OBrien-Blaney, Anderia Lorenzo L, LPN   [id number]     I have reviewed the above information and agree with above.   Deborra Medina, MD

## 2020-09-08 ENCOUNTER — Encounter: Payer: Self-pay | Admitting: Family

## 2020-09-08 ENCOUNTER — Telehealth (INDEPENDENT_AMBULATORY_CARE_PROVIDER_SITE_OTHER): Payer: PPO | Admitting: Family

## 2020-09-08 VITALS — BP 103/63 | Ht 61.0 in | Wt 157.0 lb

## 2020-09-08 DIAGNOSIS — F4321 Adjustment disorder with depressed mood: Secondary | ICD-10-CM

## 2020-09-08 NOTE — Assessment & Plan Note (Signed)
New , uncontrolled. Discuss pharmacotherpay including xanax or trazodone , which patient politely declines at this time. Counseled on the Calm App.Referral to counseling. She will let me know how I can support her.

## 2020-09-08 NOTE — Progress Notes (Signed)
Virtual Visit via Video Note  I connected with@  on 09/08/20 at 10:30 AM EDT by a video enabled telemedicine application and verified that I am speaking with the correct person using two identifiers.  Location patient: home Location provider:work  Persons participating in the virtual visit: patient, provider  I discussed the limitations of evaluation and management by telemedicine and the availability of in person appointments. The patient expressed understanding and agreed to proceed.   HPI:  Stress , anxiety and depression when son last month has decided to no longer be part of the family. He is not communicating with her and his girlfriend has convinced that his family mistreated him.  Husband has early signs of dementia and she is caregiver.   Tearful, trouble staying asleep.  She is interested in referral to counselor which she has seen in the past.   Staying busy  and working at [employer]. She has a strong faith.   CPE 10/05/20  ROS: See pertinent positives and negatives per HPI.    EXAM:  VITALS per patient if applicable: BP 119/14   Ht 5\' 1"  (1.549 m)   Wt 157 lb (71.2 kg)   BMI 29.66 kg/m  BP Readings from Last 3 Encounters:  09/08/20 103/63  07/06/20 118/72  06/18/20 (!) 146/85   Wt Readings from Last 3 Encounters:  09/08/20 157 lb (71.2 kg)  09/02/20 158 lb (71.7 kg)  07/06/20 158 lb (71.7 kg)    GENERAL: alert, oriented, appears well and in no acute distress  HEENT: atraumatic, conjunttiva clear, no obvious abnormalities on inspection of external nose and ears  NECK: normal movements of the head and neck  LUNGS: on inspection no signs of respiratory distress, breathing rate appears normal, no obvious gross SOB, gasping or wheezing  CV: no obvious cyanosis  MS: moves all visible extremities without noticeable abnormality  PSYCH/NEURO: pleasant and cooperative, no obvious depression or anxiety, speech and thought processing grossly intact  ASSESSMENT  AND PLAN:  Discussed the following assessment and plan:  Problem List Items Addressed This Visit       Other   Grief reaction - Primary    New , uncontrolled. Discuss pharmacotherpay including xanax or trazodone , which patient politely declines at this time. Counseled on the Calm App.Referral to counseling. She will let me know how I can support her.        Relevant Orders   Ambulatory referral to Psychology    -we discussed possible serious and likely etiologies, options for evaluation and workup, limitations of telemedicine visit vs in person visit, treatment, treatment risks and precautions. Pt prefers to treat via telemedicine empirically rather then risking or undertaking an in person visit at this moment.  .   I discussed the assessment and treatment plan with the patient. The patient was provided an opportunity to ask questions and all were answered. The patient agreed with the plan and demonstrated an understanding of the instructions.   The patient was advised to call back or seek an in-person evaluation if the symptoms worsen or if the condition fails to improve as anticipated.   Mable Paris, FNP

## 2020-09-27 ENCOUNTER — Encounter: Payer: Self-pay | Admitting: Family

## 2020-09-28 ENCOUNTER — Encounter: Payer: Self-pay | Admitting: Emergency Medicine

## 2020-09-28 ENCOUNTER — Emergency Department
Admission: EM | Admit: 2020-09-28 | Discharge: 2020-09-28 | Disposition: A | Discharge status: Home / Self Care | Payer: PPO | Attending: Emergency Medicine | Admitting: Emergency Medicine

## 2020-09-28 ENCOUNTER — Emergency Department: Payer: PPO

## 2020-09-28 ENCOUNTER — Ambulatory Visit
Admission: EM | Admit: 2020-09-28 | Discharge: 2020-09-28 | Disposition: A | Discharge status: Home / Self Care | Payer: PPO | Attending: Emergency Medicine | Admitting: Emergency Medicine

## 2020-09-28 ENCOUNTER — Other Ambulatory Visit: Payer: Self-pay

## 2020-09-28 DIAGNOSIS — R946 Abnormal results of thyroid function studies: Secondary | ICD-10-CM | POA: Insufficient documentation

## 2020-09-28 DIAGNOSIS — Z79899 Other long term (current) drug therapy: Secondary | ICD-10-CM | POA: Diagnosis not present

## 2020-09-28 DIAGNOSIS — R079 Chest pain, unspecified: Secondary | ICD-10-CM | POA: Diagnosis not present

## 2020-09-28 DIAGNOSIS — Z20822 Contact with and (suspected) exposure to covid-19: Secondary | ICD-10-CM | POA: Diagnosis not present

## 2020-09-28 DIAGNOSIS — R002 Palpitations: Secondary | ICD-10-CM | POA: Insufficient documentation

## 2020-09-28 DIAGNOSIS — Z853 Personal history of malignant neoplasm of breast: Secondary | ICD-10-CM | POA: Insufficient documentation

## 2020-09-28 DIAGNOSIS — R9431 Abnormal electrocardiogram [ECG] [EKG]: Secondary | ICD-10-CM

## 2020-09-28 DIAGNOSIS — R0789 Other chest pain: Secondary | ICD-10-CM | POA: Insufficient documentation

## 2020-09-28 DIAGNOSIS — R7989 Other specified abnormal findings of blood chemistry: Secondary | ICD-10-CM

## 2020-09-28 DIAGNOSIS — I10 Essential (primary) hypertension: Secondary | ICD-10-CM | POA: Insufficient documentation

## 2020-09-28 DIAGNOSIS — Z85828 Personal history of other malignant neoplasm of skin: Secondary | ICD-10-CM | POA: Insufficient documentation

## 2020-09-28 LAB — HEPATIC FUNCTION PANEL
ALT: 16 U/L (ref 0–44)
AST: 19 U/L (ref 15–41)
Albumin: 4.6 g/dL (ref 3.5–5.0)
Alkaline Phosphatase: 72 U/L (ref 38–126)
Bilirubin, Direct: <0.1 mg/dL (ref 0.0–0.2)
Total Bilirubin: 0.6 mg/dL (ref 0.3–1.2)
Total Protein: 7.6 g/dL (ref 6.5–8.1)

## 2020-09-28 LAB — BASIC METABOLIC PANEL
Anion gap: 11 (ref 5–15)
BUN: 14 mg/dL (ref 8–23)
CO2: 27 mmol/L (ref 22–32)
Calcium: 10.3 mg/dL (ref 8.9–10.3)
Chloride: 100 mmol/L (ref 98–111)
Creatinine, Ser: 0.83 mg/dL (ref 0.44–1.00)
GFR, Estimated: >60 mL/min (ref >60)
Glucose, Bld: 101 mg/dL — ABNORMAL HIGH (ref 70–99)
Potassium: 4 mmol/L (ref 3.5–5.1)
Sodium: 138 mmol/L (ref 135–145)

## 2020-09-28 LAB — TROPONIN I (HIGH SENSITIVITY)
Troponin I (High Sensitivity): 3 ng/L (ref <18)
Troponin I (High Sensitivity): 3 ng/L (ref <18)

## 2020-09-28 LAB — CBC
HCT: 42.3 % (ref 36.0–46.0)
Hemoglobin: 14.1 g/dL (ref 12.0–15.0)
MCH: 30.7 pg (ref 26.0–34.0)
MCHC: 33.3 g/dL (ref 30.0–36.0)
MCV: 92 fL (ref 80.0–100.0)
Platelets: 289 10*3/uL (ref 150–400)
RBC: 4.6 MIL/uL (ref 3.87–5.11)
RDW: 13.1 % (ref 11.5–15.5)
WBC: 7.8 10*3/uL (ref 4.0–10.5)
nRBC: 0 % (ref 0.0–0.2)

## 2020-09-28 LAB — TSH: TSH: 4.078 u[IU]/mL (ref 0.350–4.500)

## 2020-09-28 LAB — T4, FREE: Free T4: 1.85 ng/dL — ABNORMAL HIGH (ref 0.61–1.12)

## 2020-09-28 LAB — RESP PANEL BY RT-PCR (FLU A&B, COVID) ARPGX2
Influenza A by PCR: NEGATIVE
Influenza B by PCR: NEGATIVE
SARS Coronavirus 2 by RT PCR: NEGATIVE

## 2020-09-28 LAB — CK: Total CK: 257 U/L — ABNORMAL HIGH (ref 38–234)

## 2020-09-28 MED ORDER — SODIUM CHLORIDE 0.9 % IV BOLUS: 500.0000 mL | Freq: Once | INTRAVENOUS | Status: DC

## 2020-09-28 MED ORDER — ACETAMINOPHEN 500 MG PO TABS
1000.0000 mg | ORAL_TABLET | Freq: Once | ORAL | Status: DC
  Filled 2020-09-28: qty 2

## 2020-09-28 NOTE — Progress Notes (Signed)
   09/28/20 1340  Clinical Encounter Type  Visited With Patient  Visit Type Initial;Social support;Spiritual support  Referral From Other (Comment) (rounding)  Spiritual Encounters  Spiritual Needs Other (Comment) (general support; hospitality)  Chaplain was rounding on unit and noticed PT was shivering. Chaplain Burris checked-in with PT and returned with warm towels. Chaplain Burris provided a compassionate, non-anxious presence and hospitality. No further needs specified at this time; Chaplain Burris reiterated availability of pastoral support if needed.

## 2020-09-28 NOTE — ED Provider Notes (Signed)
CHIEF COMPLAINT:   Chief Complaint  Patient presents with   Palpitations     SUBJECTIVE/HPI:   Palpitations A very pleasant 70 y.o.Female presents today with heart palpitations which started yesterday in church.  Patient reports she noticed a jump in her heart rate from 129-139 during these episodes from her apple watch.  Patient reports that "it felt like my heart was beating out of my chest".  Patient also reports some dull aching behind the left shoulder region for which she states feels like a pulled muscle.  Patient denies any history of heart issues and states that she has just recently been diagnosed within the last couple of years with hypertension.  Patient does not report any chest pain, shortness of breath, dizziness, weakness or additional symptoms today.   has a past medical history of Allergy, Breast cancer ([place]) (2009), Cancer ([place]) (2009), Headache, Heart murmur (2008), Personal history of radiation therapy, and Squamous cell carcinoma. ROS:  Review of Systems  Cardiovascular:  Positive for palpitations.  See Subjective/HPI Medications, Allergies and Problem List personally reviewed in Epic today OBJECTIVE:   Vitals:   09/28/20 1119  BP: (!) 144/91  Pulse: 88  Resp: 18  Temp: 98.2 F (36.8 C)  SpO2: 96%    Physical Exam   General: Appears well-developed and well-nourished. No acute distress.  HEENT Head: Normocephalic and atraumatic.  Ears: Hearing grossly intact, no drainage or visible deformity.  Eyes: Conjunctivae and EOM are normal. No eye drainage or scleral icterus bilaterally.  Neck: Normal range of motion, neck is supple.  Cardiovascular: Normal rate . Regular rhythm; no murmurs, gallops, or rubs.  Pulm/Chest: No respiratory distress. Neurological: Alert and oriented to person, place, and time.  Skin: Skin is warm and dry.  Psychiatric: Normal mood, affect, behavior, and thought content.   Vital signs and nursing note reviewed.   Patient stable  and cooperative with examination.  LABS/X-RAYS/EKG/MEDS:   No results found for any visits on 09/28/20.  MEDICAL DECISION MAKING:   Patient presents with heart palpitations which started yesterday in church.  Patient reports she noticed a jump in her heart rate from 129-139 during these episodes from her apple watch.  Patient reports that "it felt like my heart was beating out of my chest".  Patient also reports some dull aching behind the left shoulder region for which she states feels like a pulled muscle.  Patient denies any history of heart issues and states that she has just recently been diagnosed within the last couple of years with hypertension.  Patient does not report any chest pain, shortness of breath, dizziness, weakness or additional symptoms today.  Chart review completed.  EKG shows a change from prior in 2021.  There is a new nonspecific T wave abnormality.  Given symptoms and EKG changes, advised the patient that she does need to report to the emergency department for further investigation of symptoms.  Discussed EMS transport with the patient, but patient states that she feels comfortable driving herself and is with her husband.  Patient to report to [facility] emergency department via private vehicle.  Stable on discharge.  Patient verbalized understanding and agreed with plan.  ASSESSMENT/PLAN:  1. Heart palpitations  2. Abnormal EKG  No orders of the defined types were placed in this encounter.  Instructions about new medications and side effects provided.  Plan:   Discharge Instructions      Your current condition warrants further evaluation and/or treatment which exceed services available to you  in this urgent care setting. I have discussed with you your currrent condition and the need for further evaluation and/or treatment in an emergency department setting. In response to my medical recommendation, you have opted to go to the emergency department at: [facility]       A copy of these instructions have been given to the patient or responsible adult who demonstrated the ability to learn, asked appropriate questions, and verbalized understanding of the plan of care.  There were no barriers to learning identified.   Serafina Royals, FNP-C 09/28/20  This note was partially made with the aid of speech-to-text dictation; typographical errors are not intentional.    Serafina Royals, [place] 09/28/20 1148

## 2020-09-28 NOTE — ED Notes (Signed)
See triage note, pt reports HTN and heart palpitations for one week. States has prn meds to take for BP >130/80, usually takes herbs to control.  Pt alert and oriented. NAD noted

## 2020-09-28 NOTE — Discharge Instructions (Addendum)
Please call cardiology to make a follow-up appointment for a Holter monitor to make sure there are no signs of an arrhythmia that would need you to be on additional medications to help prevent stroke and keep your heart rates low.  Your thyroid test was also slightly abnormal and you should follow this up with your primary doctor.  Return to the ER if you develop worsening symptoms or any other concern

## 2020-09-28 NOTE — ED Provider Notes (Signed)
[facility] Emergency Department Provider Note  ____________________________________________   Event Date/Time   First MD Initiated Contact with Patient 09/28/20 1314     (approximate)  I have reviewed the triage vital signs and the nursing notes.   HISTORY  Chief Complaint Hypertension    HPI Kelly Mata is a 70 y.o. female with hypertension who comes in with heart palpitations.  Patient reports feeling heart palpitations and her heart rate going up to 129-139 on her apple watch.  Denies a history of heart attack.  Denies any stents placed.  Denies Holter monitor previously.  States that she is had intermittent episodes of her heart racing in the past but denies anything as fast as it was on Sunday.  She reports a little bit of a burning sensation in her left chest wall, nothing makes it better or worse.  States it is very minimal in nature.  Denies any shortness of breath, leg swelling.  Has a remote history of cancer but is been in remission for many years no longer on tamoxifen.  Denies any abdominal pain.  Patient reports taking hydrochlorothiazide as needed for blood pressure.  She reports that she had very rarely needs to take it but she is having take it for the last few days.  She was not sure if she just could be dehydrated.   Patient is on 12.5 of hydrochlorothiazide          Past Medical History:  Diagnosis Date   Allergy    Breast cancer ([place]) 2009   neg   Cancer ([place]) 2009   left breast DCIS, 5 yrs tamoxifen   Headache    Heart murmur 2008   Personal history of radiation therapy    Squamous cell carcinoma     Patient Active Problem List   Diagnosis Date Noted   Grief reaction 09/08/2020   Arthralgia 10/07/2019   Arthritis 07/23/2019   Genital herpes 07/23/2019   Migraine 07/23/2019   Leg pain, bilateral 07/17/2019   HTN (hypertension) 04/26/2019   Liver lesion 04/26/2019   Dysuria 04/04/2018   Diarrhea 02/23/2018    Atherosclerosis of aorta ([place]) 08/21/2017   Nonintractable headache 08/16/2017   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 No ovaries. States no cervix.    BREAST BIOPSY Left 2009   stereo breast biopsy, +   BREAST CYST ASPIRATION  90s   BREAST EXCISIONAL BIOPSY Left 2008   neg   BREAST EXCISIONAL BIOPSY Left 1998   BREAST LUMPECTOMY Left 2009   radiation   COLONOSCOPY  2013   [employer]    Prior to Admission medications   Medication Sig Start Date End Date Taking? Authorizing Provider  Ascorbic Acid (VITAMIN C PO) Take by mouth daily.    [provider]  BIOTIN PO Take by mouth daily.    [provider]  Cyanocobalamin (VITAMIN B12 PO) Take by mouth daily.    [provider]  HAWTHORN PO Take 2 capsules by mouth daily.    [provider]  hydrochlorothiazide (MICROZIDE) 12.5 MG capsule Take 1 capsule (  12.5 mg total) by mouth daily as needed. If Blood pressure > 130/80. Patient not taking: No sig reported 03/04/20   Burnard Hawthorne, FNP  ibuprofen (ADVIL,MOTRIN) 200 MG tablet Take 200 mg by mouth every 6 (six) hours as needed.    [provider]  VITAMIN D PO Take 1 capsule by mouth daily. 2000 IU    [provider]    Allergies Amlodipine, Ceclor [cefaclor], and Ampicillin  Family History  Problem Relation Age of Onset   Cancer Other        cousin, breast   Cancer Father        pancreatic (died at age 44)   Breast cancer Cousin 46   Cancer Mother        skin - unsure of type but thinks it was melanoma (died at age 23)   Cancer  Maternal Aunt    Diabetes Daughter     Social History Social History   Tobacco Use   Smoking status: Never   Smokeless tobacco: Never  Vaping Use   Vaping Use: Never used  Substance Use Topics   Alcohol use: Yes    Comment: 5 glasses of wine   Drug use: No      Review of Systems Constitutional: No fever/chills Eyes: No visual changes. ENT: No sore throat. Cardiovascular: Positive chest pain, palpitation Respiratory: Denies shortness of breath. Gastrointestinal: No abdominal pain.  No nausea, no vomiting.  No diarrhea.  No constipation. Genitourinary: Negative for dysuria. Musculoskeletal: Negative for back pain. Skin: Negative for rash. Neurological: Negative for headaches, focal weakness or numbness. All other ROS negative ____________________________________________   PHYSICAL EXAM:  VITAL SIGNS: ED Triage Vitals  Enc Vitals Group     BP 09/28/20 1218 (!) 178/88     Pulse Rate 09/28/20 1218 91     Resp 09/28/20 1218 18     Temp 09/28/20 1218 98.2 F (36.8 C)     Temp src --      SpO2 09/28/20 1218 100 %     Weight --      Height --      Head Circumference --      Peak Flow --      Pain Score 09/28/20 1216 2     Pain Loc --      Pain Edu? --      Excl. in [place]? --     Constitutional: Alert and oriented. Well appearing and in no acute distress. Eyes: Conjunctivae are normal. EOMI. Head: Atraumatic. Nose: No congestion/rhinnorhea. Mouth/Throat: Mucous membranes are moist.   Neck: No stridor. Trachea Midline. FROM Cardiovascular: Normal rate, regular rhythm. Grossly normal heart sounds.  Good peripheral circulation. Respiratory: Normal respiratory effort.  No retractions. Lungs CTAB. Gastrointestinal: Soft and nontender. No distention. No abdominal bruits.  Musculoskeletal: No lower extremity tenderness nor edema.  No joint effusions. Neurologic:  Normal speech and language. No gross focal neurologic deficits are appreciated.  Equal strength in arms and  legs. Skin:  Skin is warm, dry and intact. No rash noted. Psychiatric: Mood and affect are normal. Speech and behavior are normal. GU: Deferred   ____________________________________________   LABS (all labs ordered are listed, but only abnormal results are displayed)  Labs Reviewed  BASIC METABOLIC PANEL - Abnormal; Notable for the following components:      Result Value   Glucose, Bld 101 (*)    All other components within normal limits  RESP PANEL BY RT-PCR (FLU A&B, COVID) ARPGX2  CBC  TSH  T4, FREE  CK  HEPATIC FUNCTION PANEL  TROPONIN I (HIGH SENSITIVITY)   ____________________________________________   ED ECG REPORT I, Vanessa Chaffee, the attending physician, personally viewed and interpreted this ECG.  Rate of 83, no obvious ST elevation or T wave inversions but there is a lot of artifact and therefore I really cannot tell this is truly atrial fibrillation or normal sinus.  We will get a repeat EKG ____________________________________________  RADIOLOGY I, Vanessa Loving, personally viewed and evaluated these images (plain radiographs) as part of my medical decision making, as well as reviewing the written report by the radiologist.  ED MD interpretation: No pneumonia  Official radiology report(s): DG Chest 2 View  Result Date: 09/28/2020 CLINICAL DATA:  Chest pain EXAM: CHEST - 2 VIEW COMPARISON:  04/16/2019 chest radiograph. FINDINGS: Stable cardiomediastinal silhouette with normal heart size. No pneumothorax. No pleural effusion. No pulmonary edema stable thin curvilinear scar in the left mid lung. Chronic calcified granulomas in the left lower lobe overlie the midthoracic spine on lateral view, as seen on prior chest CT angiogram study. No acute consolidative airspace disease. IMPRESSION: No active cardiopulmonary disease. Electronically Signed   By: Ilona Sorrel M.D.   On: 09/28/2020 13:05     ____________________________________________   PROCEDURES  Procedure(s) performed (including Critical Care):  .1-3 Lead EKG Interpretation  Date/Time: 09/28/2020 1:40 PM Performed by: Vanessa [place], MD Authorized by: Vanessa [place], MD     Interpretation: normal     ECG rate:  90s   ECG rate assessment: normal     Rhythm: sinus rhythm     Ectopy: none     Conduction: normal     ____________________________________________   INITIAL IMPRESSION / ASSESSMENT AND PLAN / ED COURSE   LINAH KLAPPER was evaluated in Emergency Department on 09/28/2020 for the symptoms described in the history of present illness. She was evaluated in the context of the global COVID-19 pandemic, which necessitated consideration that the patient might be at risk for infection with the SARS-CoV-2 virus that causes COVID-19. Institutional protocols and algorithms that pertain to the evaluation of patients at risk for COVID-19 are in a state of rapid change based on information released by regulatory bodies including the CDC and federal and state organizations. These policies and algorithms were followed during the patient's care in the ED.    Most Likely DDx:  -Given this could be an arrhythmia we will keep patient on the cardiac monitor.  Will get EKG.  Initial EKG difficult to read due to a lot of artifact but when I looked at the cardiac monitor she appears to be in normal sinus.  We will get a repeat EKG to confirm.  We will add on thyroid levels.   DDx that was also considered d/t potential to cause harm, but was found less likely based on history and physical (as detailed above): -PNA (no fevers, cough but CXR to evaluate) -PNX (reassured with equal b/l breath sounds, CXR to evaluate) -Symptomatic anemia (will get H&H) -Pulmonary embolism as no sob at rest, not pleuritic in nature, no hypoxia -Aortic Dissection as no tearing pain and no radiation to the mid back, pulses equal -Pericarditis no rub  on exam, EKG changes or hx to suggest dx -Tamponade (no notable SOB, tachycardic, hypotensive) -Esophageal rupture (no h/o diffuse vomitting/no crepitus)  Repeat EKG looks more like sinus rhythm.  His troponin is negative but given patient's chest discomfort started this morning we will get  a repeat her T4 was slightly elevated concerning for hyperthyroidism.  Patient states that she hates being started on medication I told her to follow this up with her primary care doctor.  Orthostatics were grossly negative.  She stated that she felt a little dizzy when she stood up and her heart rate went up 20 points but she declined IV fluids stated that she has not eaten or drink anything all day so she feels that is why she feels a little dizzy.  She also declined the Tylenol I put in earlier stating that she has reactions to medications if she has not eaten on them.  If repeat troponin is negative anticipate patient can be discharged home with follow-up with cardiology for Holter monitor and follow-up with PCP for elevated thyroid level      ____________________________________________   FINAL CLINICAL IMPRESSION(S) / ED DIAGNOSES   Final diagnoses:  Abnormal thyroid blood test  Palpitations     MEDICATIONS GIVEN DURING THIS VISIT:  Medications  acetaminophen (TYLENOL) tablet 1,000 mg (1,000 mg Oral Patient Refused/Not Given 09/28/20 1334)     ED Discharge Orders     None        Note:  This document was prepared using Dragon voice recognition software and may include unintentional dictation errors.    Vanessa [place], MD 09/28/20 [phone]

## 2020-09-28 NOTE — ED Triage Notes (Signed)
Pt comes with c/o heart palpitations and HTN. Pt states this started about a week ago.  Pt denies any SOb. Pt states tightness to left side of chest.

## 2020-09-28 NOTE — Discharge Instructions (Addendum)
Your current condition warrants further evaluation and/or treatment which exceed services available to you in this urgent care setting. I have discussed with you your currrent condition and the need for further evaluation and/or treatment in an emergency department setting. In response to my medical recommendation, you have opted to go to the emergency department at: [facility]

## 2020-09-28 NOTE — Telephone Encounter (Signed)
Please advise 

## 2020-09-28 NOTE — ED Triage Notes (Signed)
Pt presents today with c/o of intermittent heart palpitations with higher blood pressure.Denies CP or SOB now.

## 2020-09-29 ENCOUNTER — Other Ambulatory Visit: Payer: Self-pay | Admitting: Family

## 2020-09-29 ENCOUNTER — Encounter: Payer: Self-pay | Admitting: Family

## 2020-09-29 DIAGNOSIS — R002 Palpitations: Secondary | ICD-10-CM

## 2020-10-05 ENCOUNTER — Ambulatory Visit (INDEPENDENT_AMBULATORY_CARE_PROVIDER_SITE_OTHER): Payer: PPO | Admitting: Family

## 2020-10-05 ENCOUNTER — Other Ambulatory Visit: Payer: Self-pay

## 2020-10-05 ENCOUNTER — Encounter: Payer: Self-pay | Admitting: Family

## 2020-10-05 VITALS — BP 126/64 | HR 72 | Temp 98.2°F | Ht 61.0 in | Wt 158.6 lb

## 2020-10-05 DIAGNOSIS — R002 Palpitations: Secondary | ICD-10-CM | POA: Insufficient documentation

## 2020-10-05 DIAGNOSIS — Z0000 Encounter for general adult medical examination without abnormal findings: Secondary | ICD-10-CM | POA: Diagnosis not present

## 2020-10-05 NOTE — Assessment & Plan Note (Signed)
Colonoscopy and mammogram up-to-date.  Patient.  Politely declines clinical breast exam today or pelvic exam.  She is no longer screening for cervical cancer.

## 2020-10-05 NOTE — Patient Instructions (Signed)
Hold biotin ahead of thyroid labs. Ordered thyroid ultrasound. Let us know if you dont hear back within a week in regards to an appointment being scheduled.  Health Maintenance for Postmenopausal Women Menopause is a normal process in which your ability to get pregnant comes to an end. This process happens slowly over many months or years, usually between the ages of 23 and 78. Menopause is complete when you have missed your menstrualperiods for 12 months. It is important to talk with your health care provider about some of the most common conditions that affect women after menopause (postmenopausal women). These include heart disease, cancer, and bone loss (osteoporosis). Adopting a healthy lifestyle and getting preventive care can help to promote your health and wellness. The actions you take can also lower your chances ofdeveloping some of these common conditions. What should I know about menopause? During menopause, you may get a number of symptoms, such as: Hot flashes. These can be moderate or severe. Night sweats. Decrease in sex drive. Mood swings. Headaches. Tiredness. Irritability. Memory problems. Insomnia. Choosing to treat or not to treat these symptoms is a decision that you makewith your health care provider. Do I need hormone replacement therapy? Hormone replacement therapy is effective in treating symptoms that are caused by menopause, such as hot flashes and night sweats. Hormone replacement carries certain risks, especially as you become older. If you are thinking about using estrogen or estrogen with progestin, discuss the benefits and risks with your health care provider. What is my risk for heart disease and stroke? The risk of heart disease, heart attack, and stroke increases as you age. One of the causes may be a change in the body's hormones during menopause. This can affect how your body uses dietary fats, triglycerides, and cholesterol. Heart attack and stroke are  medical emergencies. There are many things that you cando to help prevent heart disease and stroke. Watch your blood pressure High blood pressure causes heart disease and increases the risk of stroke. This is more likely to develop in people who have high blood pressure readings, are of African descent, or are overweight. Have your blood pressure checked: Every 3-5 years if you are 21-40 years of age. Every year if you are 79 years old or older. Eat a healthy diet  Eat a diet that includes plenty of vegetables, fruits, low-fat dairy products, and lean protein. Do not eat a lot of foods that are high in solid fats, added sugars, or sodium.  Get regular exercise Get regular exercise. This is one of the most important things you can do for your health. Most adults should: Try to exercise for at least 150 minutes each week. The exercise should increase your heart rate and make you sweat (moderate-intensity exercise). Try to do strengthening exercises at least twice each week. Do these in addition to the moderate-intensity exercise. Spend less time sitting. Even light physical activity can be beneficial. Other tips Work with your health care provider to achieve or maintain a healthy weight. Do not use any products that contain nicotine or tobacco, such as cigarettes, e-cigarettes, and chewing tobacco. If you need help quitting, ask your health care provider. Know your numbers. Ask your health care provider to check your cholesterol and your blood sugar (glucose). Continue to have your blood tested as directed by your health care provider. Do I need screening for cancer? Depending on your health history and family history, you may need to have cancer screening at different stages of your  life. This may include screening for: Breast cancer. Cervical cancer. Lung cancer. Colorectal cancer. What is my risk for osteoporosis? After menopause, you may be at increased risk for osteoporosis.  Osteoporosis is a condition in which bone destruction happens more quickly than new bone creation. To help prevent osteoporosis or the bone fractures that can happen because of osteoporosis, you may take the following actions: If you are 25-13 years old, get at least 1,000 mg of calcium and at least 600 mg of vitamin D per day. If you are older than age 81 but younger than age 59, get at least 1,200 mg of calcium and at least 600 mg of vitamin D per day. If you are older than age 87, get at least 1,200 mg of calcium and at least 800 mg of vitamin D per day. Smoking and drinking excessive alcohol increase the risk of osteoporosis. Eat foods that are rich in calcium and vitamin D, and do weight-bearing exercisesseveral times each week as directed by your health care provider. How does menopause affect my mental health? Depression may occur at any age, but it is more common as you become older. Common symptoms of depression include: Low or sad mood. Changes in sleep patterns. Changes in appetite or eating patterns. Feeling an overall lack of motivation or enjoyment of activities that you previously enjoyed. Frequent crying spells. Talk with your health care provider if you think that you are experiencingdepression. General instructions See your health care provider for regular wellness exams and vaccines. This may include: Scheduling regular health, dental, and eye exams. Getting and maintaining your vaccines. These include: Influenza vaccine. Get this vaccine each year before the flu season begins. Pneumonia vaccine. Shingles vaccine. Tetanus, diphtheria, and pertussis (Tdap) booster vaccine. Your health care provider may also recommend other immunizations. Tell your health care provider if you have ever been abused or do not feel safeat home. Summary Menopause is a normal process in which your ability to get pregnant comes to an end. This condition causes hot flashes, night sweats, decreased  interest in sex, mood swings, headaches, or lack of sleep. Treatment for this condition may include hormone replacement therapy. Take actions to keep yourself healthy, including exercising regularly, eating a healthy diet, watching your weight, and checking your blood pressure and blood sugar levels. Get screened for cancer and depression. Make sure that you are up to date with all your vaccines. This information is not intended to replace advice given to you by your health care provider. Make sure you discuss any questions you have with your healthcare provider. Document Revised: 02/21/2018 Document Reviewed: 02/21/2018 Elsevier Patient Education  2022 [employer].

## 2020-10-05 NOTE — Assessment & Plan Note (Addendum)
Reviewed emergency room visit with patient today.  Upcoming appointment with cardiology. fortunately no recurrence of palpitations.  She has upcoming appointment with cardiology.  Thyroid exam normal today.  We agreed to pursue ultrasound of thyroid as well as repeat thyroid studies.  She will hold biotin.

## 2020-10-05 NOTE — Progress Notes (Signed)
Subjective:    Patient ID: Kelly Mata, female    DOB: July 11, 1950, 70 y.o.   MRN: [id number]  CC: Kelly Mata is a 70 y.o. female who presents today for physical exam and follow-up emergency room visit for palpitations, abnormal thyroid test  HPI: She feels well today  No new complaints  No recurrence of palpitations in the past 2 weeks.  Reported 110-139 when sitting in church. Associated with dizziness. No syncope, CP, sob, vision changes.   She takes HCTZ 12.'5mg'$  prn and had been taking more often the past 2 weeks. She thinks dehydration from HCTZ 12.'5mg'$  contributed to palpitations. She is compliant with hawthorne supplement for bp management.     She presents emergency room 09/28/2020 for palpitations  diagnosed with abnormal thyroid Chest x-ray showed no active cardiopulmonary disease.  Troponin negative x 2.  She continues to take hydrochlorothiazide 12.5 mg as needed. She has upcoming appoint with Dr. Clayborn Bigness 10/29/2020  She has history of squamous cell carcinoma   Colorectal Cancer Screening: UTD, Dr Thurman Kelly Mata, reported 2016.  She states she is due in 10 years Breast Cancer Screening: Mammogram UTD Cervical Cancer Screening: History of abdominal hysterectomy for noncancerous reasons.  She states no cervix. No h/o GYN cancer.   Bone Health screening/DEXA for 65+: UTD, osteopenia         Tetanus - utd      Labs: Screening labs today. Exercise: Gets regular exercise.   Alcohol use:  occassional Smoking/tobacco use: Nonsmoker.     HISTORY:  Past Medical History:  Diagnosis Date   Allergy    Breast cancer ([place]) 2009   neg   Cancer [facility]) 2009   left breast DCIS, 5 yrs tamoxifen   Headache    Heart murmur 2008   Personal history of radiation therapy    Squamous cell carcinoma     Past Surgical History:  Procedure Laterality Date   ABDOMINAL HYSTERECTOMY  1998   had for noncancerous reasons, had fibroids. No ovaries. States no cervix.    BREAST BIOPSY  Left 2009   stereo breast biopsy, +   BREAST CYST ASPIRATION  90s   BREAST EXCISIONAL BIOPSY Left 2008   neg   BREAST EXCISIONAL BIOPSY Left 1998   BREAST LUMPECTOMY Left 2009   radiation   COLONOSCOPY  2013   OOPHORECTOMY     TONSILLECTOMY  1957   Family History  Problem Relation Age of Onset   Cancer Other        cousin, breast   Cancer Father        pancreatic (died at age 69)   Breast cancer Cousin 58   Cancer Mother        skin - unsure of type but thinks it was melanoma (died at age 57)   Cancer Maternal Aunt    Diabetes Daughter       ALLERGIES: Amlodipine, Ceclor [cefaclor], and Ampicillin  Current Outpatient Medications on File Prior to Visit  Medication Sig Dispense Refill   Ascorbic Acid (VITAMIN C PO) Take by mouth daily.     BIOTIN PO Take by mouth daily.     Cyanocobalamin (VITAMIN B12 PO) Take by mouth daily.     HAWTHORN PO Take 2 capsules by mouth daily.     hydrochlorothiazide (MICROZIDE) 12.5 MG capsule Take 1 capsule (12.5 mg total) by mouth daily as needed. If Blood pressure > 130/80. 90 capsule 1   ibuprofen (ADVIL,MOTRIN) 200 MG tablet Take 200 mg by  mouth every 6 (six) hours as needed.     VITAMIN D PO Take 1 capsule by mouth daily. 2000 IU     No current facility-administered medications on file prior to visit.    Social History   Tobacco Use   Smoking status: Never   Smokeless tobacco: Never  Vaping Use   Vaping Use: Never used  Substance Use Topics   Alcohol use: Yes    Comment: 5 glasses of wine   Drug use: No    Review of Systems  Constitutional:  Negative for chills, fever and unexpected weight change.  HENT:  Negative for congestion.   Respiratory:  Negative for cough.   Cardiovascular:  Negative for chest pain, palpitations (resolved) and leg swelling.  Gastrointestinal:  Negative for nausea and vomiting.  Musculoskeletal:  Negative for arthralgias and myalgias.  Skin:  Negative for rash.  Neurological:  Negative for  headaches.  Hematological:  Negative for adenopathy.  Psychiatric/Behavioral:  Negative for confusion.      Objective:    BP 126/64 (BP Location: Left Arm, Patient Position: Sitting, Cuff Size: Large)   Pulse 72   Temp 98.2 F (36.8 C) (Oral)   Ht '5\' 1"'$  (1.549 m)   Wt 158 lb 9.6 oz (71.9 kg)   SpO2 98%   BMI 29.97 kg/m   BP Readings from Last 3 Encounters:  10/05/20 126/64  09/28/20 (!) 157/78  09/28/20 (!) 144/91   Wt Readings from Last 3 Encounters:  10/05/20 158 lb 9.6 oz (71.9 kg)  09/08/20 157 lb (71.2 kg)  09/02/20 158 lb (71.7 kg)    Physical Exam Vitals reviewed.  Constitutional:      Appearance: Normal appearance. She is well-developed.  Eyes:     Conjunctiva/sclera: Conjunctivae normal.  Neck:     Thyroid: No thyroid mass or thyromegaly.  Cardiovascular:     Rate and Rhythm: Normal rate and regular rhythm.     Pulses: Normal pulses.     Heart sounds: Normal heart sounds.  Pulmonary:     Effort: Pulmonary effort is normal.     Breath sounds: Normal breath sounds. No wheezing, rhonchi or rales.  Abdominal:     General: Bowel sounds are normal. There is no distension.     Palpations: Abdomen is soft. Abdomen is not rigid. There is no fluid wave or mass.     Tenderness: There is no abdominal tenderness. There is no guarding or rebound.  Lymphadenopathy:     Head:     Right side of head: No submental, submandibular, tonsillar, preauricular, posterior auricular or occipital adenopathy.     Left side of head: No submental, submandibular, tonsillar, preauricular, posterior auricular or occipital adenopathy.     Cervical: No cervical adenopathy.  Skin:    General: Skin is warm and dry.  Neurological:     Mental Status: She is alert.  Psychiatric:        Speech: Speech normal.        Behavior: Behavior normal.        Thought Content: Thought content normal.       Assessment & Plan:   Problem List Items Addressed This Visit       Other   Palpitations     Reviewed emergency room visit with patient today.  Fortunately no recurrence of palpitations.  She has upcoming appointment with cardiology.  Thyroid exam normal today.  We agreed to pursue ultrasound of thyroid as well as repeat thyroid studies.  She will  hold biotin.       Relevant Orders   T4, free   T3, free   TSH   US THYROID   Routine physical examination - Primary    Colonoscopy and mammogram up-to-date.  Patient.  Politely declines clinical breast exam today or pelvic exam.  She is no longer screening for cervical cancer.       Relevant Orders   VITAMIN D 25 Hydroxy (Vit-D Deficiency, Fractures)   Lipid panel     I am having [name]. Roselli maintain her ibuprofen, HAWTHORN PO, Ascorbic Acid (VITAMIN C PO), VITAMIN D PO, Cyanocobalamin (VITAMIN B12 PO), BIOTIN PO, and hydrochlorothiazide.   No orders of the defined types were placed in this encounter.   Return precautions given.   Risks, benefits, and alternatives of the medications and treatment plan prescribed today were discussed, and patient expressed understanding.   Education regarding symptom management and diagnosis given to patient on AVS.   Continue to follow with Burnard Hawthorne, FNP for routine health maintenance.   Kelly Mata and I agreed with plan.   Mable Paris, FNP

## 2020-10-16 ENCOUNTER — Ambulatory Visit (INDEPENDENT_AMBULATORY_CARE_PROVIDER_SITE_OTHER): Payer: PPO | Admitting: Psychology

## 2020-10-16 DIAGNOSIS — F4323 Adjustment disorder with mixed anxiety and depressed mood: Secondary | ICD-10-CM | POA: Diagnosis not present

## 2020-10-19 ENCOUNTER — Other Ambulatory Visit: Payer: Self-pay

## 2020-10-19 ENCOUNTER — Ambulatory Visit
Admission: RE | Admit: 2020-10-19 | Discharge: 2020-10-19 | Disposition: A | Discharge status: Home / Self Care | Payer: PPO | Source: Ambulatory Visit | Attending: Family | Admitting: Family

## 2020-10-19 DIAGNOSIS — E041 Nontoxic single thyroid nodule: Secondary | ICD-10-CM | POA: Diagnosis not present

## 2020-10-19 DIAGNOSIS — R002 Palpitations: Secondary | ICD-10-CM | POA: Diagnosis not present

## 2020-10-20 ENCOUNTER — Ambulatory Visit (INDEPENDENT_AMBULATORY_CARE_PROVIDER_SITE_OTHER): Payer: PPO | Admitting: Psychology

## 2020-10-20 ENCOUNTER — Encounter: Payer: Self-pay | Admitting: Family

## 2020-10-20 DIAGNOSIS — E041 Nontoxic single thyroid nodule: Secondary | ICD-10-CM | POA: Insufficient documentation

## 2020-10-20 DIAGNOSIS — F4323 Adjustment disorder with mixed anxiety and depressed mood: Secondary | ICD-10-CM | POA: Diagnosis not present

## 2020-10-21 ENCOUNTER — Other Ambulatory Visit: Payer: Self-pay

## 2020-10-21 ENCOUNTER — Other Ambulatory Visit (INDEPENDENT_AMBULATORY_CARE_PROVIDER_SITE_OTHER): Payer: PPO

## 2020-10-21 DIAGNOSIS — R002 Palpitations: Secondary | ICD-10-CM

## 2020-10-21 DIAGNOSIS — Z0000 Encounter for general adult medical examination without abnormal findings: Secondary | ICD-10-CM

## 2020-10-21 LAB — LIPID PANEL
Cholesterol: 179 mg/dL (ref 0–200)
HDL: 61.1 mg/dL (ref >39.00)
LDL Cholesterol: 105 mg/dL — ABNORMAL HIGH (ref 0–99)
NonHDL: 118.36
Total CHOL/HDL Ratio: 3
Triglycerides: 66 mg/dL (ref 0.0–149.0)
VLDL: 13.2 mg/dL (ref 0.0–40.0)

## 2020-10-21 LAB — T3, FREE: T3, Free: 3.1 pg/mL (ref 2.3–4.2)

## 2020-10-21 LAB — VITAMIN D 25 HYDROXY (VIT D DEFICIENCY, FRACTURES): VITD: 35.88 ng/mL (ref 30.00–100.00)

## 2020-10-21 LAB — T4, FREE: Free T4: 0.88 ng/dL (ref 0.60–1.60)

## 2020-10-21 LAB — TSH: TSH: 3.68 u[IU]/mL (ref 0.35–5.50)

## 2020-10-29 DIAGNOSIS — E6609 Other obesity due to excess calories: Secondary | ICD-10-CM | POA: Diagnosis not present

## 2020-10-29 DIAGNOSIS — R0602 Shortness of breath: Secondary | ICD-10-CM | POA: Diagnosis not present

## 2020-10-29 DIAGNOSIS — R002 Palpitations: Secondary | ICD-10-CM | POA: Diagnosis not present

## 2020-10-29 DIAGNOSIS — Z6830 Body mass index (BMI) 30.0-30.9, adult: Secondary | ICD-10-CM | POA: Diagnosis not present

## 2020-10-29 DIAGNOSIS — I700 Atherosclerosis of aorta: Secondary | ICD-10-CM | POA: Diagnosis not present

## 2020-10-29 DIAGNOSIS — M1990 Unspecified osteoarthritis, unspecified site: Secondary | ICD-10-CM | POA: Diagnosis not present

## 2020-11-19 ENCOUNTER — Ambulatory Visit: Payer: PPO | Admitting: Psychology

## 2020-11-26 DIAGNOSIS — R0602 Shortness of breath: Secondary | ICD-10-CM | POA: Diagnosis not present

## 2020-11-26 DIAGNOSIS — R002 Palpitations: Secondary | ICD-10-CM | POA: Diagnosis not present

## 2020-12-04 DIAGNOSIS — D2261 Melanocytic nevi of right upper limb, including shoulder: Secondary | ICD-10-CM | POA: Diagnosis not present

## 2020-12-04 DIAGNOSIS — L821 Other seborrheic keratosis: Secondary | ICD-10-CM | POA: Diagnosis not present

## 2020-12-04 DIAGNOSIS — L570 Actinic keratosis: Secondary | ICD-10-CM | POA: Diagnosis not present

## 2020-12-04 DIAGNOSIS — D2272 Melanocytic nevi of left lower limb, including hip: Secondary | ICD-10-CM | POA: Diagnosis not present

## 2020-12-04 DIAGNOSIS — Z85828 Personal history of other malignant neoplasm of skin: Secondary | ICD-10-CM | POA: Diagnosis not present

## 2020-12-04 DIAGNOSIS — X32XXXA Exposure to sunlight, initial encounter: Secondary | ICD-10-CM | POA: Diagnosis not present

## 2020-12-04 DIAGNOSIS — D2262 Melanocytic nevi of left upper limb, including shoulder: Secondary | ICD-10-CM | POA: Diagnosis not present

## 2020-12-09 DIAGNOSIS — E6609 Other obesity due to excess calories: Secondary | ICD-10-CM | POA: Diagnosis not present

## 2020-12-09 DIAGNOSIS — R0602 Shortness of breath: Secondary | ICD-10-CM | POA: Diagnosis not present

## 2020-12-09 DIAGNOSIS — R002 Palpitations: Secondary | ICD-10-CM | POA: Diagnosis not present

## 2020-12-09 DIAGNOSIS — I700 Atherosclerosis of aorta: Secondary | ICD-10-CM | POA: Diagnosis not present

## 2020-12-09 DIAGNOSIS — M1990 Unspecified osteoarthritis, unspecified site: Secondary | ICD-10-CM | POA: Diagnosis not present

## 2020-12-09 DIAGNOSIS — Z6830 Body mass index (BMI) 30.0-30.9, adult: Secondary | ICD-10-CM | POA: Diagnosis not present

## 2021-01-05 ENCOUNTER — Encounter: Payer: Self-pay | Admitting: Family

## 2021-01-05 ENCOUNTER — Other Ambulatory Visit: Payer: Self-pay

## 2021-01-05 ENCOUNTER — Ambulatory Visit (INDEPENDENT_AMBULATORY_CARE_PROVIDER_SITE_OTHER): Payer: PPO | Admitting: Family

## 2021-01-05 DIAGNOSIS — M25562 Pain in left knee: Secondary | ICD-10-CM | POA: Diagnosis not present

## 2021-01-05 DIAGNOSIS — E785 Hyperlipidemia, unspecified: Secondary | ICD-10-CM | POA: Diagnosis not present

## 2021-01-05 DIAGNOSIS — I10 Essential (primary) hypertension: Secondary | ICD-10-CM | POA: Diagnosis not present

## 2021-01-05 DIAGNOSIS — G8929 Other chronic pain: Secondary | ICD-10-CM | POA: Diagnosis not present

## 2021-01-05 DIAGNOSIS — I159 Secondary hypertension, unspecified: Secondary | ICD-10-CM

## 2021-01-05 NOTE — Assessment & Plan Note (Signed)
Trivial aortic regurgitation seen on echocardiogram with Dr. Clayborn Bigness.  In his note he mentioned consideration for statin therapy.  I reiterated this to patient today.  She very politely declines statin therapy at this time.  She would like to monitor cholesterol annually.

## 2021-01-05 NOTE — Assessment & Plan Note (Signed)
Chronic, unchanged.  Advised consideration for corticosteroid injection with orthopedics.  She will consider this and let me know if she would like to return to [facility] or have  a second opinion with [place] [facility]

## 2021-01-05 NOTE — Assessment & Plan Note (Signed)
Excellent control without the use of hydrochlorothiazide.  She will keep hydrochlorothiazide 12.5 mg on hand for as needed use.

## 2021-01-05 NOTE — Progress Notes (Signed)
Subjective:    Patient ID: Kelly Mata, female    DOB: 30-Jul-1950, 70 y.o.   MRN: [id number]  CC: Kelly Mata is a 70 y.o. female who presents today for follow up.   HPI: Patient feels well today.  No new complaints.  Palpitations are less frequent and she has not had a sustained episode.  No chest pain or shortness of breath.  Exercises limited by chronic left knee pain.  She is considering knee replacement and following with [facility].  She may consider second opinion for her left knee and she will call me if she would like to pursue this.   She has not required hydrochlorothiazide in some time.  She has only used 2 tablets in total for as needed use for blood pressure.   Consult with Dr. Clayborn Bigness 12/09/2020 palpitations.  Per cardiology, relatively benign Holter monitor ( unable to results in epic).  Consider Cardizem or beta-blocker if symptoms were to persist.  Echocardiogram w with normal left ventricular systolic function.  No valvular stenosis.  Trivial AR, mild TR consider statin therapy.  Hypertension-compliant with HCTZ HISTORY:  Past Medical History:  Diagnosis Date   Allergy    Breast cancer ([place]) 2009   neg   Cancer [facility]) 2009   left breast DCIS, 5 yrs tamoxifen   Headache    Heart murmur 2008   Personal history of radiation therapy    Squamous cell carcinoma    Past Surgical History:  Procedure Laterality Date   ABDOMINAL HYSTERECTOMY  1998   had for noncancerous reasons, had fibroids. No ovaries. States no cervix.    BREAST BIOPSY Left 2009   stereo breast biopsy, +   BREAST CYST ASPIRATION  90s   BREAST EXCISIONAL BIOPSY Left 2008   neg   BREAST EXCISIONAL BIOPSY Left 1998   BREAST LUMPECTOMY Left 2009   radiation   COLONOSCOPY  2013   OOPHORECTOMY     TONSILLECTOMY  1957   Family History  Problem Relation Age of Onset   Cancer Other        cousin, breast   Cancer Father        pancreatic (died at age 32)   Breast cancer Cousin 57    Cancer Mother        skin - unsure of type but thinks it was melanoma (died at age 57)   Cancer Maternal Aunt    Diabetes Daughter     Allergies: Amlodipine, Ceclor [cefaclor], and Ampicillin Current Outpatient Medications on File Prior to Visit  Medication Sig Dispense Refill   Ascorbic Acid (VITAMIN C PO) Take by mouth daily.     BIOTIN PO Take by mouth daily.     Cyanocobalamin (VITAMIN B12 PO) Take by mouth daily.     HAWTHORN PO Take 2 capsules by mouth daily.     hydrochlorothiazide (MICROZIDE) 12.5 MG capsule Take 1 capsule (12.5 mg total) by mouth daily as needed. If Blood pressure > 130/80. 90 capsule 1   ibuprofen (ADVIL,MOTRIN) 200 MG tablet Take 200 mg by mouth every 6 (six) hours as needed.     VITAMIN D PO Take 1 capsule by mouth daily. 2000 IU     No current facility-administered medications on file prior to visit.    Social History   Tobacco Use   Smoking status: Never   Smokeless tobacco: Never  Vaping Use   Vaping Use: Never used  Substance Use Topics   Alcohol use: Yes  Comment: 5 glasses of wine   Drug use: No    Review of Systems  Constitutional:  Negative for chills and fever.  Respiratory:  Negative for cough and shortness of breath.   Cardiovascular:  Negative for chest pain and palpitations.  Gastrointestinal:  Negative for nausea and vomiting.  Musculoskeletal:  Positive for arthralgias (left knee).     Objective:    BP 122/82 (BP Location: Left Arm, Patient Position: Sitting, Cuff Size: Large)   Pulse 75   Temp 98.1 F (36.7 C) (Oral)   Ht 5\' 1"  (1.549 m)   Wt 160 lb 3.2 oz (72.7 kg)   SpO2 99%   BMI 30.27 kg/m  BP Readings from Last 3 Encounters:  01/05/21 122/82  10/05/20 126/64  09/28/20 (!) 157/78   Wt Readings from Last 3 Encounters:  01/05/21 160 lb 3.2 oz (72.7 kg)  10/05/20 158 lb 9.6 oz (71.9 kg)  09/08/20 157 lb (71.2 kg)    Physical Exam Vitals reviewed.  Constitutional:      Appearance: She is well-developed.   Eyes:     Conjunctiva/sclera: Conjunctivae normal.  Cardiovascular:     Rate and Rhythm: Normal rate and regular rhythm.     Pulses: Normal pulses.     Heart sounds: Normal heart sounds.  Pulmonary:     Effort: Pulmonary effort is normal.     Breath sounds: Normal breath sounds. No wheezing, rhonchi or rales.  Skin:    General: Skin is warm and dry.  Neurological:     Mental Status: She is alert.  Psychiatric:        Speech: Speech normal.        Behavior: Behavior normal.        Thought Content: Thought content normal.       Assessment & Plan:   Problem List Items Addressed This Visit       Cardiovascular and Mediastinum   HTN (hypertension)    Excellent control without the use of hydrochlorothiazide.  She will keep hydrochlorothiazide 12.5 mg on hand for as needed use.        Other   HLD (hyperlipidemia)    Trivial aortic regurgitation seen on echocardiogram with Dr. Clayborn Bigness.  In his note he mentioned consideration for statin therapy.  I reiterated this to patient today.  She very politely declines statin therapy at this time.  She would like to monitor cholesterol annually.      Left knee pain    Chronic, unchanged.  Advised consideration for corticosteroid injection with orthopedics.  She will consider this and let me know if she would like to return to [facility] or have  a second opinion with Kelly Mata        I am having Kelly Mata maintain her ibuprofen, HAWTHORN PO, Ascorbic Acid (VITAMIN C PO), VITAMIN D PO, Cyanocobalamin (VITAMIN B12 PO), BIOTIN PO, and hydrochlorothiazide.   No orders of the defined types were placed in this encounter.   Return precautions given.   Risks, benefits, and alternatives of the medications and treatment plan prescribed today were discussed, and patient expressed understanding.   Education regarding symptom management and diagnosis given to patient on AVS.  Continue to follow with Kelly Hawthorne, FNP  for routine health maintenance.   Kelly Mata and I agreed with plan.   Kelly Paris, FNP

## 2021-03-17 ENCOUNTER — Telehealth: Payer: Self-pay

## 2021-03-17 NOTE — Telephone Encounter (Signed)
-----   Message from [place], [place] sent at 02/19/2020  4:53 PM EST ----- Needs repeat MRI. See messages from 02/2020. Pt will be aware and on top of this.

## 2021-03-17 NOTE — Telephone Encounter (Signed)
Left vm requesting a return call to schedule repeat MRI per Dr. Allen Norris.

## 2021-03-19 ENCOUNTER — Other Ambulatory Visit: Payer: Self-pay

## 2021-03-19 ENCOUNTER — Other Ambulatory Visit: Payer: Self-pay | Admitting: Family

## 2021-03-19 DIAGNOSIS — Z1231 Encounter for screening mammogram for malignant neoplasm of breast: Secondary | ICD-10-CM

## 2021-03-19 DIAGNOSIS — K769 Liver disease, unspecified: Secondary | ICD-10-CM

## 2021-03-23 NOTE — Telephone Encounter (Signed)
Pt scheduled for a follow up MRI on Friday, Jan 13th at 5:00pm. Pt has been [facility] 4:30[facility] and be NPO 4 hours prior to scan.

## 2021-03-26 ENCOUNTER — Ambulatory Visit: Payer: PPO

## 2021-04-01 ENCOUNTER — Ambulatory Visit
Admission: RE | Admit: 2021-04-01 | Discharge: 2021-04-01 | Disposition: A | Discharge status: Home / Self Care | Payer: PPO | Source: Ambulatory Visit | Attending: Gastroenterology | Admitting: Gastroenterology

## 2021-04-01 ENCOUNTER — Other Ambulatory Visit: Payer: Self-pay

## 2021-04-01 DIAGNOSIS — D1803 Hemangioma of intra-abdominal structures: Secondary | ICD-10-CM | POA: Diagnosis not present

## 2021-04-01 DIAGNOSIS — K769 Liver disease, unspecified: Secondary | ICD-10-CM | POA: Insufficient documentation

## 2021-04-01 DIAGNOSIS — D1771 Benign lipomatous neoplasm of kidney: Secondary | ICD-10-CM | POA: Diagnosis not present

## 2021-04-01 DIAGNOSIS — Z853 Personal history of malignant neoplasm of breast: Secondary | ICD-10-CM | POA: Diagnosis not present

## 2021-04-01 MED ORDER — GADOBUTROL 1 MMOL/ML IV SOLN
7.0000 mL | Freq: Once | INTRAVENOUS | Status: AC | PRN
  Administered 2021-04-01: 7 mL via INTRAVENOUS

## 2021-04-21 ENCOUNTER — Other Ambulatory Visit: Payer: Self-pay

## 2021-04-21 ENCOUNTER — Ambulatory Visit
Admission: RE | Admit: 2021-04-21 | Discharge: 2021-04-21 | Disposition: A | Discharge status: Home / Self Care | Payer: PPO | Source: Ambulatory Visit | Attending: Family | Admitting: Family

## 2021-04-21 DIAGNOSIS — Z1231 Encounter for screening mammogram for malignant neoplasm of breast: Secondary | ICD-10-CM | POA: Diagnosis not present

## 2021-07-06 ENCOUNTER — Ambulatory Visit: Payer: PPO | Admitting: Family

## 2021-07-07 ENCOUNTER — Other Ambulatory Visit: Payer: Self-pay

## 2021-07-07 ENCOUNTER — Encounter: Payer: Self-pay | Admitting: Family

## 2021-07-07 ENCOUNTER — Ambulatory Visit (INDEPENDENT_AMBULATORY_CARE_PROVIDER_SITE_OTHER): Payer: PPO | Admitting: Family

## 2021-07-07 VITALS — BP 126/84 | HR 74 | Temp 97.7°F | Ht 61.0 in | Wt 159.0 lb

## 2021-07-07 DIAGNOSIS — I10 Essential (primary) hypertension: Secondary | ICD-10-CM | POA: Diagnosis not present

## 2021-07-07 DIAGNOSIS — I159 Secondary hypertension, unspecified: Secondary | ICD-10-CM

## 2021-07-07 DIAGNOSIS — M25562 Pain in left knee: Secondary | ICD-10-CM

## 2021-07-07 DIAGNOSIS — G8929 Other chronic pain: Secondary | ICD-10-CM | POA: Diagnosis not present

## 2021-07-07 LAB — COMPREHENSIVE METABOLIC PANEL
ALT: 18 U/L (ref 0–35)
AST: 19 U/L (ref 0–37)
Albumin: 4.3 g/dL (ref 3.5–5.2)
Alkaline Phosphatase: 79 U/L (ref 39–117)
BUN: 14 mg/dL (ref 6–23)
CO2: 28 mEq/L (ref 19–32)
Calcium: 9.4 mg/dL (ref 8.4–10.5)
Chloride: 104 mEq/L (ref 96–112)
Creatinine, Ser: 0.83 mg/dL (ref 0.40–1.20)
GFR: 71.17 mL/min (ref >60.00)
Glucose, Bld: 91 mg/dL (ref 70–99)
Potassium: 4.4 mEq/L (ref 3.5–5.1)
Sodium: 139 mEq/L (ref 135–145)
Total Bilirubin: 0.4 mg/dL (ref 0.2–1.2)
Total Protein: 6.5 g/dL (ref 6.0–8.3)

## 2021-07-07 NOTE — Patient Instructions (Signed)
Nice to see you!   

## 2021-07-07 NOTE — Progress Notes (Signed)
? ?Subjective:  ? ? Patient ID: Kelly Mata, female    DOB: 03/27/1950, 71 y.o.   MRN: [id number] ? ?CC: Kelly Mata is a 71 y.o. female who presents today for follow up.  ? ?HPI: Feels well today ?No new concerns ? ?HTN- she has noticed that she is taking hctz 12.'5mg'$  more often.  ?She has days where 160/83 and then after medication responds to 120/80. She is pleased with medication ? ?She continues to have left knee pain. It is intermittent and worse after exercise, steps. Pain has improved over all. She was seen 07/09/20. She continues to consider left knee replacement.  ?No knee swelling, increased heat,  numbness, back pain  ? ?HISTORY:  ?Past Medical History:  ?Diagnosis Date  ? Allergy   ? Breast cancer ([place]) 2009  ? neg  ? Cancer [facility]) 2009  ? left breast DCIS, 5 yrs tamoxifen  ? Headache   ? Heart murmur 2008  ? Personal history of radiation therapy   ? Squamous cell carcinoma   ? ?Past Surgical History:  ?Procedure Laterality Date  ? ABDOMINAL HYSTERECTOMY  1998  ? had for noncancerous reasons, had fibroids. No ovaries. States no cervix.   ? BREAST BIOPSY Left 2009  ? stereo breast biopsy, +  ? BREAST CYST ASPIRATION  90s  ? BREAST EXCISIONAL BIOPSY Left 2008  ? neg  ? BREAST EXCISIONAL BIOPSY Left 1998  ? BREAST LUMPECTOMY Left 2009  ? radiation  ? COLONOSCOPY  2013  ? OOPHORECTOMY    ? TONSILLECTOMY  1957  ? ?Family History  ?Problem Relation Age of Onset  ? Cancer Other   ?     cousin, breast  ? Cancer Father   ?     pancreatic (died at age 41)  ? Breast cancer Cousin 59  ? Cancer Mother   ?     skin - unsure of type but thinks it was melanoma (died at age 75)  ? Cancer Maternal Aunt   ? Diabetes Daughter   ? ? ?Allergies: Amlodipine, Ceclor [cefaclor], and Ampicillin ?Current Outpatient Medications on File Prior to Visit  ?Medication Sig Dispense Refill  ? Ascorbic Acid (VITAMIN C PO) Take by mouth daily.    ? BIOTIN PO Take by mouth daily.    ? Cyanocobalamin (VITAMIN B12 PO) Take by mouth  daily.    ? HAWTHORN PO Take 2 capsules by mouth daily.    ? hydrochlorothiazide (MICROZIDE) 12.5 MG capsule Take 1 capsule (12.5 mg total) by mouth daily as needed. If Blood pressure > 130/80. 90 capsule 1  ? ibuprofen (ADVIL,MOTRIN) 200 MG tablet Take 200 mg by mouth every 6 (six) hours as needed.    ? Turmeric 400 MG CAPS Take 1 capsule by mouth daily.    ? VITAMIN D PO Take 1 capsule by mouth daily. 2000 IU    ? ?No current facility-administered medications on file prior to visit.  ? ? ?Social History  ? ?Tobacco Use  ? Smoking status: Never  ? Smokeless tobacco: Never  ?Vaping Use  ? Vaping Use: Never used  ?Substance Use Topics  ? Alcohol use: Yes  ?  Comment: 5 glasses of wine  ? Drug use: No  ? ? ?Review of Systems  ?Constitutional:  Negative for chills and fever.  ?Respiratory:  Negative for cough.   ?Cardiovascular:  Negative for chest pain and palpitations.  ?Gastrointestinal:  Negative for nausea and vomiting.  ?Musculoskeletal:  Positive for arthralgias (left  knee pain).  ?   ?Objective:  ?  ?BP 126/84 (BP Location: Right Arm, Patient Position: Sitting, Cuff Size: Normal)   Pulse 74   Temp 97.7 ?F (36.5 ?C) (Oral)   Ht '5\' 1"'$  (1.549 m)   Wt 159 lb (72.1 kg)   SpO2 99%   BMI 30.04 kg/m?  ?BP Readings from Last 3 Encounters:  ?07/07/21 126/84  ?01/05/21 122/82  ?10/05/20 126/64  ? ?Wt Readings from Last 3 Encounters:  ?07/07/21 159 lb (72.1 kg)  ?01/05/21 160 lb 3.2 oz (72.7 kg)  ?10/05/20 158 lb 9.6 oz (71.9 kg)  ? ? ?Physical Exam ?Vitals reviewed.  ?Constitutional:   ?   Appearance: She is well-developed.  ?Eyes:  ?   Conjunctiva/sclera: Conjunctivae normal.  ?Cardiovascular:  ?   Rate and Rhythm: Normal rate and regular rhythm.  ?   Pulses: Normal pulses.  ?   Heart sounds: Normal heart sounds.  ?Pulmonary:  ?   Effort: Pulmonary effort is normal.  ?   Breath sounds: Normal breath sounds. No wheezing, rhonchi or rales.  ?Musculoskeletal:  ?   Right knee: No swelling. Normal range of motion. No  tenderness.  ?   Left knee: No swelling. Normal range of motion. No tenderness.  ?   Comments: Bilateral knees are symmetric. No effusion appreciated. No increase in warmth or erythema. Crepitus felt with flexion of bilateral knees. ? ?Left knee:  ?Able to extend to -5 to 10 degrees and flex to 110 degrees. No catching with McMurray maneuver. No patellar apprehension.  ? ?No calf tenderness of lower leg edema bilaterally.  ?  ?Skin: ?   General: Skin is warm and dry.  ?Neurological:  ?   Mental Status: She is alert.  ?Psychiatric:     ?   Speech: Speech normal.     ?   Behavior: Behavior normal.     ?   Thought Content: Thought content normal.  ? ? ?   ?Assessment & Plan:  ? ?Problem List Items Addressed This Visit   ? ?  ? Cardiovascular and Mediastinum  ? HTN (hypertension) - Primary  ?  Chronic, well controlled with prn hctz 12.'5mg'$ .She will continue to monitor blood pressure.  ? ?  ?  ? Relevant Orders  ? Comprehensive metabolic panel  ?  ? Other  ? Left knee pain  ?  Chronic, stable. She declines repeat imaging or consult with orthopedics at this time. She will consider this later in the year.  ?Advised to continue use of ace wrap, icing. She will let me know how she is doing ? ?  ?  ? ? ? ?I am having [name]. [place] maintain her ibuprofen, HAWTHORN PO, Ascorbic Acid (VITAMIN C PO), VITAMIN D PO, Cyanocobalamin (VITAMIN B12 PO), BIOTIN PO, hydrochlorothiazide, and Turmeric. ? ? ?No orders of the defined types were placed in this encounter. ? ? ?Return precautions given.  ? ?Risks, benefits, and alternatives of the medications and treatment plan prescribed today were discussed, and patient expressed understanding.  ? ?Education regarding symptom management and diagnosis given to patient on AVS. ? ?Continue to follow with Burnard Hawthorne, FNP for routine health maintenance.  ? ?Charna Busman and I agreed with plan.  ? ?Mable Paris, FNP ? ? ?

## 2021-07-07 NOTE — Assessment & Plan Note (Signed)
Chronic, stable. She declines repeat imaging or consult with orthopedics at this time. She will consider this later in the year.  ?Advised to continue use of ace wrap, icing. She will let me know how she is doing ?

## 2021-07-07 NOTE — Assessment & Plan Note (Signed)
Chronic, well controlled with prn hctz 12.'5mg'$ .She will continue to monitor blood pressure.  ?

## 2021-09-07 ENCOUNTER — Ambulatory Visit (INDEPENDENT_AMBULATORY_CARE_PROVIDER_SITE_OTHER): Payer: PPO

## 2021-09-07 VITALS — Ht 61.0 in | Wt 159.0 lb

## 2021-09-07 DIAGNOSIS — Z0000 Encounter for general adult medical examination without abnormal findings: Secondary | ICD-10-CM

## 2021-09-07 NOTE — Progress Notes (Signed)
Subjective:   Kelly Mata is a 71 y.o. female who presents for Medicare Annual (Subsequent) preventive examination.  Review of Systems    No ROS.  Medicare Wellness Virtual Visit.  Visual/audio telehealth visit, UTA vital signs.   See social history for additional risk factors.   Cardiac Risk Factors include: advanced age (>10mn, >>61women)     Objective:    Today's Vitals   09/07/21 1524  Weight: 159 lb (72.1 kg)  Height: '5\' 1"'$  (1.549 m)   Body mass index is 30.04 kg/m.     09/07/2021    3:31 PM 09/28/2020   12:17 PM 09/02/2020    8:41 AM 09/02/2019    8:34 AM 04/16/2019    9:23 AM 08/31/2018    8:43 AM 08/30/2017    9:37 AM  Advanced Directives  Does Patient Have a Medical Advance Directive? Yes No Yes Yes No Yes Yes  Type of AParamedicof [place]Living will  [place] will [place]Living will  [place]unction CityLiving will [place]Living will  Does patient want to make changes to medical advance directive? No - Patient declined  No - Patient declined No - Patient declined  No - Patient declined No - Patient declined  Copy of [place]in Chart? No - copy requested  No - copy requested No - copy requested  No - copy requested No - copy requested  Would patient like information on creating a medical advance directive?     No - Patient declined      Current Medications (verified) Outpatient Encounter Medications as of 09/07/2021  Medication Sig   Ascorbic Acid (VITAMIN C PO) Take by mouth daily.   BIOTIN PO Take by mouth daily.   Cyanocobalamin (VITAMIN B12 PO) Take by mouth daily.   HAWTHORN PO Take 2 capsules by mouth daily.   hydrochlorothiazide (MICROZIDE) 12.5 MG capsule Take 1 capsule (12.5 mg total) by mouth daily as needed. If Blood pressure > 130/80.   ibuprofen (ADVIL,MOTRIN) 200 MG tablet Take 200 mg by mouth every 6 (six) hours as  needed.   Turmeric 400 MG CAPS Take 1 capsule by mouth daily.   VITAMIN D PO Take 1 capsule by mouth daily. 2000 IU   No facility-administered encounter medications on file as of 09/07/2021.    Allergies (verified) Amlodipine, Ceclor [cefaclor], and Ampicillin   History: Past Medical History:  Diagnosis Date   Allergy    [place]   [place]   [place]   left breast DCIS, 5 yrs tamoxifen   Headache    Heart murmur 2008   Personal history of radiation therapy    Squamous cell carcinoma    Past Surgical History:  Procedure Laterality Date   ABDOMINAL HYSTERECTOMY  1998   had for noncancerous reasons, had fibroids. No ovaries. States no cervix.    BREAST BIOPSY Left 2009   stereo breast biopsy, +   BREAST CYST ASPIRATION  90s   BREAST EXCISIONAL BIOPSY Left 2008   neg   BREAST EXCISIONAL BIOPSY Left 1998   BREAST LUMPECTOMY Left 2009   radiation   COLONOSCOPY  2013   OOPHORECTOMY     TONSILLECTOMY  1957   Family History  Problem Relation Age of Onset   Cancer Other        cousin, breast   Cancer Father        pancreatic (died  at age 75)   Breast cancer Cousin 52   Cancer Mother        skin - unsure of type but thinks it was melanoma (died at age 67)   Cancer Maternal Aunt    Diabetes Daughter    Social History   Socioeconomic History   Marital status: Married    Spouse name: Not on file   Number of children: 2   Years of education: 16   Highest education level: Bachelor's degree (e.g., BA, AB, BS)  Occupational History   Occupation: Retired Corporate treasurer  Tobacco Use   Smoking status: Never   Smokeless tobacco: Never  Vaping Use   Vaping Use: Never used  Substance and Sexual Activity   Alcohol use: Yes    Comment: 5 glasses of wine   Drug use: No   Sexual activity: Not on file  Other Topics Concern   Not on file  Social History Narrative   Corporate treasurer   Retired      Married    2 children      Right-handed.    Caffeine use:  1 cup per day.   Social Determinants of Health   Financial Resource Strain: Low Risk  (09/07/2021)   Overall Financial Resource Strain (CARDIA)    Difficulty of Paying Living Expenses: Not hard at all  Food Insecurity: No Food Insecurity (09/07/2021)   Hunger Vital Sign    Worried About Running Out of Food in the Last Year: Never true    Ran Out of Food in the Last Year: Never true  Transportation Needs: No Transportation Needs (09/07/2021)   PRAPARE - Hydrologist (Medical): No    Lack of Transportation (Non-Medical): No  Physical Activity: Unknown (08/30/2017)   Exercise Vital Sign    Days of Exercise per Week: 0 days    Minutes of Exercise per Session: Not on file  Stress: No Stress Concern Present (09/07/2021)   [place]    Feeling of Stress : Not at all  Social Connections: Unknown (09/07/2021)   Social Connection and Isolation Panel [NHANES]    Frequency of Communication with Friends and Family: More than three times a week    Frequency of Social Gatherings with Friends and Family: More than three times a week    Attends Religious Services: More than 4 times per year    Active Member of [employer] or Organizations: Not on file    Attends Archivist Meetings: Not on file    Marital Status: Married    Tobacco Counseling Counseling given: Not Answered   Clinical Intake:  Pre-visit preparation completed: Yes        Diabetes: No  How often do you need to have someone help you when you read instructions, pamphlets, or other written materials from your doctor or pharmacy?: 1 - Never  Interpreter Needed?: No    Activities of Daily Living    09/07/2021    3:32 PM  In your present state of health, do you have any difficulty performing the following activities:  Hearing? 0  Vision? 0  Difficulty concentrating or making decisions? 0  Walking or climbing  stairs? 0  Dressing or bathing? 0  Doing errands, shopping? 0  Preparing Food and eating ? N  Using the Toilet? N  In the past six months, have you accidently leaked urine? N  Do you have problems with loss of bowel control?  N  Managing your Medications? N  Managing your Finances? N  Housekeeping or managing your Housekeeping? N   Patient Care Team: Burnard Hawthorne, FNP as PCP - General (Family Medicine) Christene Lye, MD (General Surgery) Ammie Dalton, Okey Regal, MD (Unknown Physician Specialty) Lucilla Lame, MD as Consulting Physician (Gastroenterology)  Indicate any recent Medical Services you may have received from other than [facility] providers in the past year (date may be approximate).     Assessment:   This is a routine wellness examination for [place].  Virtual Visit via Telephone Note  I connected with  Charna Busman on 09/07/21 at  3:15 PM EDT by telephone and verified that I am speaking with the correct person using two identifiers.  Persons participating in the virtual visit: patient/Nurse Health Advisor   I discussed the limitations of performing an evaluation and management service by telehealth. We continued and completed visit with audio only. Some vital signs may be absent or patient reported.   Hearing/Vision screen Hearing Screening - Comments:: Patient is able to hear conversational tones without difficulty.  No issues reported. Vision Screening - Comments:: Followed by [facility]  Cataract extraction, bilateral  They have seen their ophthalmologist in the last 12 months.   Dietary issues and exercise activities discussed: Current Exercise Habits: Home exercise routine, Intensity: Mild Healthy diet Good water intake   Goals Addressed             This Visit's Progress    Maintain Healthy Lifestyle       Stay active. Stay hydrated. Healthy diet.       Depression Screen    09/07/2021    3:32 PM 07/07/2021    8:35 AM 09/02/2020     8:36 AM 03/04/2020    9:51 AM 10/07/2019    4:02 PM 09/03/2019    9:05 AM 09/02/2019    8:36 AM  PHQ 2/9 Scores  PHQ - 2 Score 0 0 0 0 0 0 0  PHQ- 9 Score  0  3       Fall Risk    09/07/2021    3:31 PM 07/07/2021    8:35 AM 09/02/2020    8:44 AM 10/07/2019    4:02 PM 09/03/2019    9:05 AM  Fall Risk   Falls in the past year? 0 0 0 0 0  Number falls in past yr:  0 0 0 0  Injury with Fall?  0 0 0 0  Risk for fall due to :  No Fall Risks     Follow up Falls evaluation completed Falls evaluation completed Falls evaluation completed Falls evaluation completed Falls evaluation completed    [place]: Home free of loose throw rugs in walkways, pet beds, electrical cords, etc? Yes  Adequate lighting in your home to reduce risk of falls? Yes   ASSISTIVE DEVICES UTILIZED TO PREVENT FALLS: Life alert? No  Use of a cane, walker or w/c? No   TIMED UP AND GO: Was the test performed? No .   Cognitive Function:  Patient is alert and oriented x3.       09/02/2019    8:43 AM 08/31/2018    8:50 AM 08/30/2017    9:42 AM  6CIT Screen  What Year? 0 points 0 points 0 points  What month? 0 points 0 points 0 points  What time?  0 points 0 points  Count back from 20  0 points 0  points  Months in reverse 0 points 0 points 0 points  Repeat phrase 0 points  0 points  Total Score   0 points    Immunizations Immunization History  Administered Date(s) Administered   Influenza-Unspecified 01/10/2017   Moderna Sars-Covid-2 Vaccination 04/07/2020   PFIZER(Purple Top)SARS-COV-2 Vaccination 05/06/2019, 05/27/2019   Pneumococcal Conjugate-13 06/30/2016   Pneumococcal Polysaccharide-23 08/30/2017   Td 08/13/2007   Tdap 12/03/2020   Zoster Recombinat (Shingrix) 11/06/2017, 02/02/2018   Screening Tests Health Maintenance  Topic Date Due   COVID-19 Vaccine (4 - Booster) 09/23/2021 (Originally 06/02/2020)   INFLUENZA VACCINE  10/12/2021   MAMMOGRAM  04/22/2023    COLONOSCOPY (Pts 45-78yr Insurance coverage will need to be confirmed)  03/14/2024   TETANUS/TDAP  12/04/2030   Pneumonia Vaccine 71 Years old  Completed   DEXA SCAN  Completed   Hepatitis C Screening  Completed   Zoster Vaccines- Shingrix  Completed   HPV VACCINES  Aged Out   Health Maintenance There are no preventive care reminders to display for this patient.  Lung Cancer Screening: (Low Dose CT Chest recommended if Age 71-80years, 30 pack-year currently smoking OR have quit w/in 15years.) does not qualify.   Vision Screening: Recommended annual ophthalmology exams for early detection of glaucoma and other disorders of the eye.  Dental Screening: Recommended annual dental exams for proper oral hygiene  Community Resource Referral / Chronic Care Management: CRR required this visit?  No   CCM required this visit?  No      Plan:   Keep all routine maintenance appointments.   I have personally reviewed and noted the following in the patient's chart:   Medical and social history Use of alcohol, tobacco or illicit drugs  Current medications and supplements including opioid prescriptions.  Functional ability and status Nutritional status Physical activity Advanced directives List of other physicians Hospitalizations, surgeries, and ER visits in previous 12 months Vitals Screenings to include cognitive, depression, and falls Referrals and appointments  In addition, I have reviewed and discussed with patient certain preventive protocols, quality metrics, and best practice recommendations. A written personalized care plan for preventive services as well as general preventive health recommendations were provided to patient.     OVarney Biles LPN   [id number]

## 2021-09-07 NOTE — Patient Instructions (Addendum)
  Kelly Mata , Thank you for taking time to come for your Medicare Wellness Visit. I appreciate your ongoing commitment to your health goals. Please review the following plan we discussed and let me know if I can assist you in the future.   These are the goals we discussed:  Goals      Maintain Healthy Lifestyle     Stay active. Stay hydrated. Healthy diet.        This is a list of the screening recommended for you and due dates:  Health Maintenance  Topic Date Due   COVID-19 Vaccine (4 - Booster) 09/23/2021*   Flu Shot  10/12/2021   Mammogram  04/22/2023   Colon Cancer Screening  03/14/2024   Tetanus Vaccine  12/04/2030   Pneumonia Vaccine  Completed   DEXA scan (bone density measurement)  Completed   Hepatitis C Screening: USPSTF Recommendation to screen - Ages 12-79 yo.  Completed   Zoster (Shingles) Vaccine  Completed   HPV Vaccine  Aged Out  *Topic was postponed. The date shown is not the original due date.

## 2021-09-16 ENCOUNTER — Encounter: Payer: Self-pay | Admitting: Family

## 2021-09-16 ENCOUNTER — Telehealth (INDEPENDENT_AMBULATORY_CARE_PROVIDER_SITE_OTHER): Payer: PPO | Admitting: Family Medicine

## 2021-09-16 DIAGNOSIS — R21 Rash and other nonspecific skin eruption: Secondary | ICD-10-CM | POA: Diagnosis not present

## 2021-09-16 MED ORDER — TRIAMCINOLONE ACETONIDE 0.1 % EX CREA: 1.0000 | TOPICAL_CREAM | Freq: Two times a day (BID) | CUTANEOUS | 0 refills | Status: DC

## 2021-09-16 NOTE — Patient Instructions (Signed)
-  I sent the medication(s) we discussed to your pharmacy: Meds ordered this encounter  Medications   triamcinolone cream (KENALOG) 0.1 %    Sig: Apply 1 Application topically 2 (two) times daily.    Dispense:  30 g    Refill:  0   Mix a small amount of the triamcinilone with Cerave or Cetaphil cream and apply to the affected area twice daily.   I hope you are feeling better soon!  Seek in person care promptly if your symptoms worsen, new concerns arise or you are not improving with treatment over the next few days.  It was nice to meet you today. I help [facility] out with telemedicine visits on Tuesdays and Thursdays and am happy to help if you need a virtual follow up visit on those days. Otherwise, if you have any concerns or questions following this visit please schedule a follow up visit with your Primary Care office or seek care at a local urgent care clinic to avoid delays in care. If you are having severe or life threatening symptoms please call 911 and/or go to the nearest emergency room.

## 2021-09-16 NOTE — Progress Notes (Signed)
Virtual Visit via Video Note  I connected with Kelly Mata  on 09/16/21 at  3:20 PM EDT by a video enabled telemedicine application and verified that I am speaking with the correct person using two identifiers.  Location patient: [place] Location provider:work or home office Persons participating in the virtual visit: patient, provider  I discussed the limitations and requested verbal permission for telemedicine visit. The patient expressed understanding and agreed to proceed.   HPI:  Acute telemedicine visit for a rash: -Onset: last night -Symptoms include: R > L lower leg, erythematous - not blanching per patient - she had a pedicure  recently and they massage this area with a lotion, she was outside on pavement for several hours before this popped up - it was very hot -Denies: fevers, malaise, woods or tick/insect exposure, rash elsewhere, body aches, resp symptoms or any other symptoms -Has tried: lavender  -Pertinent past medical history: see below -Pertinent medication allergies: Allergies  Allergen Reactions   Amlodipine     Arm and leg pain   Ceclor [Cefaclor] Hives   Ampicillin Rash  ROS: See pertinent positives and negatives per HPI.  Past Medical History:  Diagnosis Date   Allergy    Breast cancer ([place]) 2009   neg   Cancer [facility]) 2009   left breast DCIS, 5 yrs tamoxifen   Headache    Heart murmur 2008   Personal history of radiation therapy    Squamous cell carcinoma     Past Surgical History:  Procedure Laterality Date   ABDOMINAL HYSTERECTOMY  1998   had for noncancerous reasons, had fibroids. No ovaries. States no cervix.    BREAST BIOPSY Left 2009   stereo breast biopsy, +   BREAST CYST ASPIRATION  90s   BREAST EXCISIONAL BIOPSY Left 2008   neg   BREAST EXCISIONAL BIOPSY Left 1998   BREAST LUMPECTOMY Left 2009   radiation   COLONOSCOPY  2013   OOPHORECTOMY     TONSILLECTOMY  1957     Current Outpatient Medications:    triamcinolone cream (KENALOG) 0.1  %, Apply 1 Application topically 2 (two) times daily., Disp: 30 g, Rfl: 0   Ascorbic Acid (VITAMIN C PO), Take by mouth daily., Disp: , Rfl:    BIOTIN PO, Take by mouth daily., Disp: , Rfl:    Cyanocobalamin (VITAMIN B12 PO), Take by mouth daily., Disp: , Rfl:    HAWTHORN PO, Take 2 capsules by mouth daily., Disp: , Rfl:    hydrochlorothiazide (MICROZIDE) 12.5 MG capsule, Take 1 capsule (12.5 mg total) by mouth daily as needed. If Blood pressure > 130/80., Disp: 90 capsule, Rfl: 1   ibuprofen (ADVIL,MOTRIN) 200 MG tablet, Take 200 mg by mouth every 6 (six) hours as needed., Disp: , Rfl:    Turmeric 400 MG CAPS, Take 1 capsule by mouth daily., Disp: , Rfl:    VITAMIN D PO, Take 1 capsule by mouth daily. 2000 IU, Disp: , Rfl:   EXAM:  VITALS per patient if applicable:  GENERAL: alert, oriented, appears well and in no acute distress  HEENT: atraumatic, conjunttiva clear, no obvious abnormalities on inspection of external nose and ears  NECK: normal movements of the head and neck  LUNGS: on inspection no signs of respiratory distress, breathing rate appears normal, no obvious gross SOB, gasping or wheezing  CV: no obvious cyanosis  SKIN: she show me her leg with patchy distribution erythematous fine ?papules/macules on medial L ank and smaller patch of similar on R  ankle  MS: moves all visible extremities without noticeable abnormality  PSYCH/NEURO: pleasant and cooperative, no obvious depression or anxiety, speech and thought processing grossly intact  ASSESSMENT AND PLAN:  Discussed the following assessment and plan:  Rash  -we discussed possible serious and likely etiologies, options for evaluation and workup, limitations of telemedicine visit vs in person visit, treatment, treatment risks and precautions. Pt is agreeable to treatment via telemedicine at this moment. Query contact dermatitis vs other (discussed many other possibilities and the need for close follow up if not  responding to tx). Opted to try topical steroid mixed with hypoallergenic cerave or cetaphil cream 1-2 times daily with close follow up in person with PCP office or UCC advised if worsening, spreading, new symptoms arise, or if is not improving with treatment as expected per our conversation of expected course. Discussed options for follow up care. Did let this patient know that I do telemedicine on Tuesdays and Thursdays for [facility] and those are the days I am logged into the system. Advised to schedule follow up visit with PCP, [place] virtual visits or UCC if any further questions or concerns to avoid delays in care.   I discussed the assessment and treatment plan with the patient. The patient was provided an opportunity to ask questions and all were answered. The patient agreed with the plan and demonstrated an understanding of the instructions.     Kelly Kern, DO

## 2021-09-16 NOTE — Telephone Encounter (Signed)
LVM to call back to office to schedule VV to discuss rash on legs

## 2021-09-21 NOTE — Telephone Encounter (Signed)
Spoke to patient and she has already been seen for the rash.

## 2021-11-11 ENCOUNTER — Encounter: Payer: Self-pay | Admitting: Family

## 2021-11-12 ENCOUNTER — Other Ambulatory Visit: Payer: Self-pay

## 2021-11-12 DIAGNOSIS — I159 Secondary hypertension, unspecified: Secondary | ICD-10-CM

## 2021-11-12 MED ORDER — HYDROCHLOROTHIAZIDE 12.5 MG PO CAPS: 12.5000 mg | ORAL_CAPSULE | Freq: Every day | ORAL | 3 refills | Status: DC | PRN

## 2021-11-12 NOTE — Telephone Encounter (Signed)
Spoke to patient and informed her that her refill of Hydrochlorothiazide 12.5 MG has been sent into pharmacy

## 2021-12-08 DIAGNOSIS — D2261 Melanocytic nevi of right upper limb, including shoulder: Secondary | ICD-10-CM | POA: Diagnosis not present

## 2021-12-08 DIAGNOSIS — L820 Inflamed seborrheic keratosis: Secondary | ICD-10-CM | POA: Diagnosis not present

## 2021-12-08 DIAGNOSIS — L298 Other pruritus: Secondary | ICD-10-CM | POA: Diagnosis not present

## 2021-12-08 DIAGNOSIS — D2272 Melanocytic nevi of left lower limb, including hip: Secondary | ICD-10-CM | POA: Diagnosis not present

## 2021-12-08 DIAGNOSIS — L858 Other specified epidermal thickening: Secondary | ICD-10-CM | POA: Diagnosis not present

## 2021-12-08 DIAGNOSIS — D485 Neoplasm of uncertain behavior of skin: Secondary | ICD-10-CM | POA: Diagnosis not present

## 2021-12-08 DIAGNOSIS — D2262 Melanocytic nevi of left upper limb, including shoulder: Secondary | ICD-10-CM | POA: Diagnosis not present

## 2021-12-08 DIAGNOSIS — Z85828 Personal history of other malignant neoplasm of skin: Secondary | ICD-10-CM | POA: Diagnosis not present

## 2021-12-08 DIAGNOSIS — L538 Other specified erythematous conditions: Secondary | ICD-10-CM | POA: Diagnosis not present

## 2021-12-20 DIAGNOSIS — D492 Neoplasm of unspecified behavior of bone, soft tissue, and skin: Secondary | ICD-10-CM | POA: Diagnosis not present

## 2022-01-07 ENCOUNTER — Encounter: Payer: Self-pay | Admitting: Family

## 2022-01-07 ENCOUNTER — Ambulatory Visit (INDEPENDENT_AMBULATORY_CARE_PROVIDER_SITE_OTHER): Payer: PPO | Admitting: Family

## 2022-01-07 VITALS — BP 136/78 | HR 84 | Temp 97.6°F | Ht 61.0 in | Wt 158.0 lb

## 2022-01-07 DIAGNOSIS — Z780 Asymptomatic menopausal state: Secondary | ICD-10-CM | POA: Diagnosis not present

## 2022-01-07 DIAGNOSIS — I10 Essential (primary) hypertension: Secondary | ICD-10-CM

## 2022-01-07 DIAGNOSIS — I159 Secondary hypertension, unspecified: Secondary | ICD-10-CM

## 2022-01-07 DIAGNOSIS — Z1231 Encounter for screening mammogram for malignant neoplasm of breast: Secondary | ICD-10-CM | POA: Diagnosis not present

## 2022-01-07 NOTE — Patient Instructions (Signed)
Please let me know if vaginal bleeding persists   please call  and schedule your 3D mammogram and /or bone density scan as we discussed.   [facility]  ( new location in 2023)  [address], [place], [place] [address]  , [place]  [phone]   I have ordered transvaginal ultrasound.  Let us know if you dont hear back within a week in regards to an appointment being scheduled.   So that you are aware, if you are [facility] MyChart user , please pay attention to your MyChart messages as you may receive a MyChart message with a phone number to call and schedule this test/appointment own your own from our referral coordinator. This is a new process so I do not want you to miss this message.  If you are not a MyChart user, you will receive a phone call.   

## 2022-01-07 NOTE — Progress Notes (Signed)
Subjective:    Patient ID: Kelly Mata, female    DOB: 02/08/1951, 71 y.o.   MRN: [id number]  CC: Kelly Mata is a 71 y.o. female who presents today for follow up.   HPI: Feels well today.  No new complaints   Hypertension- She takes hydrochlorothiazide 12.5 mg as needed. Blood pressure responds well to hctz, at home 108/63.  She feels regimen is working well for her   HISTORY:  Past Medical History:  Diagnosis Date   Allergy    Breast cancer ([place]) 2009   neg   Cancer ([place]) 2009   left breast DCIS, 5 yrs tamoxifen   Headache    Heart murmur 2008   Personal history of radiation therapy    Squamous cell carcinoma    Past Surgical History:  Procedure Laterality Date   ABDOMINAL HYSTERECTOMY  1998   had for noncancerous reasons, had fibroids. No ovaries. States no cervix.    BREAST BIOPSY Left 2009   stereo breast biopsy, +   BREAST CYST ASPIRATION  90s   BREAST EXCISIONAL BIOPSY Left 2008   neg   BREAST EXCISIONAL BIOPSY Left 1998   BREAST LUMPECTOMY Left 2009   radiation   COLONOSCOPY  2013   OOPHORECTOMY     TONSILLECTOMY  1957   Family History  Problem Relation Age of Onset   Cancer Other        cousin, breast   Cancer Father        pancreatic (died at age 58)   Breast cancer Cousin 91   Cancer Mother        skin - unsure of type but thinks it was melanoma (died at age 11)   Cancer Maternal Aunt    Diabetes Daughter     Allergies: Amlodipine, Ceclor [cefaclor], and Ampicillin Current Outpatient Medications on File Prior to Visit  Medication Sig Dispense Refill   Ascorbic Acid (VITAMIN C PO) Take by mouth daily.     BIOTIN PO Take by mouth daily.     Cyanocobalamin (VITAMIN B12 PO) Take by mouth daily.     HAWTHORN PO Take 2 capsules by mouth daily.     hydrochlorothiazide (MICROZIDE) 12.5 MG capsule Take 1 capsule (12.5 mg total) by mouth daily as needed. If Blood pressure > 130/80. 90 capsule 3   ibuprofen (ADVIL,MOTRIN) 200 MG tablet Take  200 mg by mouth every 6 (six) hours as needed.     Turmeric 400 MG CAPS Take 1 capsule by mouth daily.     VITAMIN D PO Take 1 capsule by mouth daily. 2000 IU     No current facility-administered medications on file prior to visit.    Social History   Tobacco Use   Smoking status: Never   Smokeless tobacco: Never  Vaping Use   Vaping Use: Never used  Substance Use Topics   Alcohol use: Yes    Comment: 5 glasses of wine   Drug use: No    Review of Systems  Constitutional:  Negative for chills and fever.  Respiratory:  Negative for cough.   Cardiovascular:  Negative for chest pain and palpitations.  Gastrointestinal:  Negative for nausea and vomiting.      Objective:    BP 136/78 (BP Location: Left Arm, Patient Position: Sitting, Cuff Size: Normal)   Pulse 84   Temp 97.6 F (36.4 C) (Oral)   Ht '5\' 1"'$  (1.549 m)   Wt 158 lb (71.7 kg)   SpO2 99%  BMI 29.85 kg/m  BP Readings from Last 3 Encounters:  01/07/22 136/78  07/07/21 126/84  01/05/21 122/82   Wt Readings from Last 3 Encounters:  01/07/22 158 lb (71.7 kg)  09/07/21 159 lb (72.1 kg)  07/07/21 159 lb (72.1 kg)    Physical Exam Vitals reviewed.  Constitutional:      Appearance: She is well-developed.  Eyes:     Conjunctiva/sclera: Conjunctivae normal.  Cardiovascular:     Rate and Rhythm: Normal rate and regular rhythm.     Pulses: Normal pulses.     Heart sounds: Normal heart sounds.  Pulmonary:     Effort: Pulmonary effort is normal.     Breath sounds: Normal breath sounds. No wheezing, rhonchi or rales.  Skin:    General: Skin is warm and dry.  Neurological:     Mental Status: She is alert.  Psychiatric:        Speech: Speech normal.        Behavior: Behavior normal.        Thought Content: Thought content normal.        Assessment & Plan:   Problem List Items Addressed This Visit       Cardiovascular and Mediastinum   HTN (hypertension)    Blood pressure readings particularly  readings at home are well controlled.  Continue HCTZ 12.5 mg as needed.  Patient politely declines taking daily to ease burden of checking blood pressure more often as causes leg cramping.  We will continue regimen as is.      Other Visit Diagnoses     Screening mammogram for breast cancer    -  Primary   Relevant Orders   MM 3D SCREEN BREAST BILATERAL   Asymptomatic postmenopausal state       Relevant Orders   DG Bone Density        I have discontinued Kelly Mata's triamcinolone cream. I am also having her maintain her ibuprofen, HAWTHORN PO, Ascorbic Acid (VITAMIN C PO), VITAMIN D PO, Cyanocobalamin (VITAMIN B12 PO), BIOTIN PO, Turmeric, and hydrochlorothiazide.   No orders of the defined types were placed in this encounter.   Return precautions given.   Risks, benefits, and alternatives of the medications and treatment plan prescribed today were discussed, and patient expressed understanding.   Education regarding symptom management and diagnosis given to patient on AVS.  Continue to follow with Burnard Hawthorne, FNP for routine health maintenance.   Kelly Mata and I agreed with plan.   Mable Paris, FNP

## 2022-01-07 NOTE — Assessment & Plan Note (Signed)
Blood pressure readings particularly readings at home are well controlled.  Continue HCTZ 12.5 mg as needed.  Patient politely declines taking daily to ease burden of checking blood pressure more often as causes leg cramping.  We will continue regimen as is.

## 2022-03-22 ENCOUNTER — Telehealth: Payer: PPO | Admitting: Nurse Practitioner

## 2022-03-22 DIAGNOSIS — N3000 Acute cystitis without hematuria: Secondary | ICD-10-CM | POA: Diagnosis not present

## 2022-03-22 MED ORDER — SULFAMETHOXAZOLE-TRIMETHOPRIM 800-160 MG PO TABS
1.0000 | ORAL_TABLET | Freq: Two times a day (BID) | ORAL | 0 refills | Status: AC
Start: 2022-03-22 — End: 2022-03-27

## 2022-03-22 NOTE — Progress Notes (Signed)
Virtual Visit Consent   Kelly Mata, you are scheduled for a virtual visit with a [facility] provider today. Just as with appointments in the office, your consent must be obtained to participate. Your consent will be active for this visit and any virtual visit you may have with one of our providers in the next 365 days. If you have a MyChart account, a copy of this consent can be sent to you electronically.  As this is a virtual visit, video technology does not allow for your provider to perform a traditional examination. This may limit your provider's ability to fully assess your condition. If your provider identifies any concerns that need to be evaluated in person or the need to arrange testing (such as labs, EKG, etc.), we will make arrangements to do so. Although advances in technology are sophisticated, we cannot ensure that it will always work on either your end or our end. If the connection with a video visit is poor, the visit may have to be switched to a telephone visit. With either a video or telephone visit, we are not always able to ensure that we have a secure connection.  By engaging in this virtual visit, you consent to the provision of healthcare and authorize for your insurance to be billed (if applicable) for the services provided during this visit. Depending on your insurance coverage, you may receive a charge related to this service.  I need to obtain your verbal consent now. Are you willing to proceed with your visit today? [name] has provided verbal consent on 03/22/2022 for a virtual visit (video or telephone). Apolonio Schneiders, FNP  Date: 03/22/2022 8:24 AM  Virtual Visit via Video Note   I, Apolonio Schneiders, connected with  Kelly Mata  ([id number], 01/17/1951) on 03/22/22 at  8:30 AM EST by a video-enabled telemedicine application and verified that I am speaking with the correct person using two identifiers.  Location: Patient: Virtual Visit Location Patient:  Home Provider: Virtual Visit Location Provider: Home Office   I discussed the limitations of evaluation and management by telemedicine and the availability of in person appointments. The patient expressed understanding and agreed to proceed.    History of Present Illness: Kelly Mata is a 72 y.o. who identifies as a female who was assigned female at birth, and is being seen today for symptoms consistent with UTI.  She has been experiencing burning and frequency in urination  She has been using Vitamin C and Cranberry juice   Most recent UTI was 10/2019 culture results: Component 2 yr ago  Specimen Description URINE, CLEAN CATCH  Special Requests NONE Performed at [facility], [place] [address], [place], [place] [address]  Culture 40,000 COLONIES/mL ENTEROBACTER AEROGENES Abnormal   Report Status 11/09/2019 FINAL  Organism ID, Bacteria ENTEROBACTER AEROGENES Abnormal   Resulting Agency [facility]     Susceptibility   Enterobacter aerogenes    MIC    CEFAZOLIN >=64 RESIST... Resistant    CEFTRIAXONE <=0.25 SENS... Sensitive    CIPROFLOXACIN <=0.25 SENS... Sensitive    GENTAMICIN <=1 SENSITIVE Sensitive    IMIPENEM 1 SENSITIVE Sensitive    NITROFURANTOIN 64 INTERMED... Intermediate    PIP/TAZO <=4 SENSITIVE Sensitive    TRIMETH/SULFA <=20 SENSIT... 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/22/2017   Skull lesion 06/22/2017   Intractable headache 06/19/2017   Left knee pain 01/02/2017   Routine physical examination 06/30/2016   Tremor 06/23/2016   Dizziness 06/23/2016    Allergies:  Allergies  Allergen Reactions   Amlodipine     Arm and leg pain   Ceclor [Cefaclor] Hives   Ampicillin Rash   Medications:  Current Outpatient Medications:    Ascorbic Acid (VITAMIN C PO), Take by mouth daily., Disp: , Rfl:    BIOTIN PO, Take by mouth daily., Disp: , Rfl:    Cyanocobalamin (VITAMIN B12 PO), Take by mouth daily., Disp: , Rfl:    HAWTHORN PO, Take 2 capsules by mouth daily., Disp: , Rfl:    hydrochlorothiazide (MICROZIDE) 12.5 MG capsule, Take 1 capsule (12.5 mg total) by mouth daily as needed. If Blood pressure > 130/80., Disp: 90 capsule, Rfl: 3   ibuprofen (ADVIL,MOTRIN) 200 MG tablet, Take 200 mg by mouth every 6 (six) hours as needed., Disp: , Rfl:    Turmeric 400 MG CAPS, Take 1 capsule by mouth daily., Disp: , Rfl:    VITAMIN D PO, Take 1 capsule by mouth daily. 2000 IU, Disp: , Rfl:   Observations/Objective: Patient is well-developed, well-nourished in no acute distress.  Resting comfortably  at home.  Head is normocephalic, atraumatic.  No labored breathing.  Speech is clear and coherent with logical content.  Patient is alert and oriented at baseline.    Assessment and Plan: 1. Acute cystitis without hematuria  - sulfamethoxazole-trimethoprim (BACTRIM DS) 800-160 MG tablet; Take 1 tablet by mouth 2 (two) times daily for 5 days.  Dispense: 10 tablet; Refill: 0     Follow Up Instructions: I discussed the assessment and treatment plan with the patient. The patient was provided an opportunity to ask questions and all were answered. The patient agreed with the plan and demonstrated an understanding of the instructions.  A copy of instructions were sent to the patient via MyChart unless otherwise noted below.    The patient was advised to call back  or seek an in-person evaluation if the symptoms worsen or if the condition fails to improve as anticipated.  Time:  I spent 10 minutes with the patient via telehealth technology discussing the above problems/concerns.    Apolonio Schneiders, FNP

## 2022-03-24 DIAGNOSIS — L820 Inflamed seborrheic keratosis: Secondary | ICD-10-CM | POA: Diagnosis not present

## 2022-03-24 DIAGNOSIS — L578 Other skin changes due to chronic exposure to nonionizing radiation: Secondary | ICD-10-CM | POA: Diagnosis not present

## 2022-03-24 DIAGNOSIS — D485 Neoplasm of uncertain behavior of skin: Secondary | ICD-10-CM | POA: Diagnosis not present

## 2022-03-24 DIAGNOSIS — Z85828 Personal history of other malignant neoplasm of skin: Secondary | ICD-10-CM | POA: Diagnosis not present

## 2022-03-24 DIAGNOSIS — R208 Other disturbances of skin sensation: Secondary | ICD-10-CM | POA: Diagnosis not present

## 2022-03-24 DIAGNOSIS — Z08 Encounter for follow-up examination after completed treatment for malignant neoplasm: Secondary | ICD-10-CM | POA: Diagnosis not present

## 2022-03-24 DIAGNOSIS — L538 Other specified erythematous conditions: Secondary | ICD-10-CM | POA: Diagnosis not present

## 2022-06-02 ENCOUNTER — Ambulatory Visit
Admission: RE | Admit: 2022-06-02 | Discharge: 2022-06-02 | Disposition: A | Discharge status: Home / Self Care | Payer: PPO | Source: Ambulatory Visit | Attending: Family | Admitting: Family

## 2022-06-02 DIAGNOSIS — Z1231 Encounter for screening mammogram for malignant neoplasm of breast: Secondary | ICD-10-CM | POA: Diagnosis not present

## 2022-07-24 ENCOUNTER — Ambulatory Visit
Admission: EM | Admit: 2022-07-24 | Discharge: 2022-07-24 | Disposition: A | Discharge status: Home / Self Care | Payer: PPO | Attending: Urgent Care | Admitting: Urgent Care

## 2022-07-24 DIAGNOSIS — N3001 Acute cystitis with hematuria: Secondary | ICD-10-CM | POA: Diagnosis not present

## 2022-07-24 LAB — POCT URINALYSIS DIP (MANUAL ENTRY)
Bilirubin, UA: NEGATIVE
Glucose, UA: NEGATIVE mg/dL
Ketones, POC UA: NEGATIVE mg/dL
Nitrite, UA: NEGATIVE
Protein Ur, POC: NEGATIVE mg/dL
Spec Grav, UA: 1.02 (ref 1.010–1.025)
Urobilinogen, UA: 0.2 E.U./dL
pH, UA: 7 (ref 5.0–8.0)

## 2022-07-24 MED ORDER — NITROFURANTOIN MONOHYD MACRO 100 MG PO CAPS
100.0000 mg | ORAL_CAPSULE | Freq: Two times a day (BID) | ORAL | 0 refills | Status: DC
Start: 2022-07-24 — End: 2022-07-27

## 2022-07-24 NOTE — Discharge Instructions (Signed)
Follow up here or with your primary care provider if your symptoms are worsening or not improving with treatment.     

## 2022-07-24 NOTE — ED Provider Notes (Addendum)
Renaldo Fiddler    CSN: [id number] Arrival date & time: 07/24/22  1442      History   Chief Complaint Chief Complaint  Patient presents with   Urinary Frequency    Burning while urinating - Entered by patient    HPI Kelly Mata is a 72 y.o. female.    Urinary Frequency    Presents to urgent care with dysuria and frequency starting this morning.  No flank pain.  No fever.  No suprapubic pain.  Past Medical History:  Diagnosis Date   Allergy    Breast cancer ([facility]) 2009   neg   Cancer ([facility]) 2009   left breast DCIS, 5 yrs tamoxifen   Headache    Heart murmur 2008   Personal history of radiation therapy    Squamous cell carcinoma     Patient Active Problem List   Diagnosis Date Noted   HLD (hyperlipidemia) 01/05/2021   Thyroid nodule 10/20/2020   Palpitations 10/05/2020   Grief reaction 09/08/2020   Arthralgia 10/07/2019   Arthritis 07/23/2019   Genital herpes 07/23/2019   Migraine 07/23/2019   Leg pain, bilateral 07/17/2019   HTN (hypertension) 04/26/2019   Liver lesion 04/26/2019   Dysuria 04/04/2018   Diarrhea 02/23/2018   Atherosclerosis of aorta ([facility]) 08/21/2017   Nonintractable headache 08/16/2017   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 No ovaries. States no cervix.    BREAST BIOPSY Left 2009   stereo breast biopsy, +   BREAST CYST ASPIRATION  90s   BREAST EXCISIONAL BIOPSY Left 2008   neg   BREAST EXCISIONAL BIOPSY Left 1998   BREAST LUMPECTOMY Left 2009   radiation   COLONOSCOPY  2013   OOPHORECTOMY     TONSILLECTOMY  1957    OB History     Gravida  3   Para       Term      Preterm      AB  1   Living  2      SAB      IAB  1   Ectopic      Multiple      Live Births           Obstetric Comments  Age with first menstruation-14 Age with first pregnancy-27 LMP-1998, hysterectomy          Home Medications    Prior to Admission medications   Medication Sig Start Date End Date Taking? Authorizing Provider  nitrofurantoin, macrocrystal-monohydrate, (MACROBID) 100 MG capsule Take 1 capsule (100 mg total) by mouth 2 (two) times daily. 07/24/22  Yes Cayci Mcnabb, Jeannett Senior, FNP  Ascorbic Acid (VITAMIN C PO) Take by mouth daily.    [provider]  BIOTIN PO Take by mouth daily.    [provider]  Cyanocobalamin (VITAMIN B12 PO) Take by mouth daily.    [provider]  HAWTHORN PO Take 2 capsules by mouth daily.    [provider]  hydrochlorothiazide (MICROZIDE) 12.5 MG capsule Take 1 capsule (12.5 mg total) by mouth daily as needed. If Blood pressure > 130/80. 11/12/21   Arnett, Lyn Records, FNP  ibuprofen (ADVIL,MOTRIN) 200 MG tablet Take 200 mg by mouth every 6 (six) hours as needed.    [provider]  Turmeric 400 MG CAPS Take 1 capsule by mouth daily.    [provider]  VITAMIN D PO Take 1 capsule by mouth daily. 2000 IU    [provider]    Family History Family History  Problem Relation Age of Onset   Cancer Other        cousin, breast   Cancer Father        pancreatic (died at age 49)   Breast cancer Cousin 41   Cancer Mother        skin - unsure of type but thinks it was melanoma (died at age 18)   Cancer Maternal Aunt    Diabetes Daughter     Social History Social History   Tobacco Use   Smoking status: Never   Smokeless tobacco: Never  Vaping Use   Vaping Use: Never used  Substance Use Topics   Alcohol use: Yes    Comment: 5 glasses of wine   Drug use: No     Allergies   Amlodipine, Ceclor [cefaclor], and Ampicillin   Review of  Systems Review of Systems  Genitourinary:  Positive for frequency.     Physical Exam Triage Vital Signs ED Triage Vitals [07/24/22 1547]  Enc Vitals Group     BP (!) 168/80     Pulse Rate 88     Resp 20     Temp 98 F (36.7 C)     Temp Source Oral     SpO2 98 %     Weight      Height      Head Circumference      Peak Flow      Pain Score 0     Pain Loc      Pain Edu?      Excl. in GC?    No data found.  Updated Vital Signs BP (!) 168/80 (BP Location: Left Arm)   Pulse 88   Temp 98 F (36.7 C) (Oral)   Resp 20   SpO2 98%   Visual Acuity Right Eye Distance:   Left Eye Distance:   Bilateral Distance:    Right Eye Near:   Left Eye Near:    Bilateral Near:     Physical Exam Vitals reviewed.  Constitutional:      Appearance: Normal appearance.  Skin:    General: Skin is warm and dry.  Neurological:     General: No focal deficit present.     Mental Status: She is alert and oriented to person, place, and time.  Psychiatric:        Mood and Affect: Mood normal.        Behavior: Behavior normal.      UC Treatments / Results  Labs (all labs ordered are listed, but only abnormal results are displayed) Labs Reviewed  POCT URINALYSIS DIP (MANUAL ENTRY) - Abnormal; Notable for the following components:      Result Value   Blood, UA moderate (*)    Leukocytes, UA Large (3+) (*)    All other components within normal limits    EKG   Radiology No results found.  Procedures Procedures (including critical care time)  Medications Ordered in UC Medications - No data to display  Initial Impression / Assessment and Plan / UC Course  I have reviewed the triage vital signs and the nursing  notes.  Pertinent labs & imaging results that were available during my care of the patient were reviewed by me and considered in my medical decision making (see chart for details).   UA is strongly indicative of acute cystitis.  Treating with Macrobid.  GFR greater than  70.  Sending culture to verify susceptibility.  Reviewed chart history. Additional history obtained from patient family/caregiver present during the exam.  Counseled patient on potential for adverse effects with medications prescribed/recommended today, ER and return-to-clinic precautions discussed, patient verbalized understanding and agreement with care plan.  Final Clinical Impressions(s) / UC Diagnoses   Final diagnoses:  Acute cystitis with hematuria     Discharge Instructions      Follow up here or with your primary care provider if your symptoms are worsening or not improving with treatment.      ED Prescriptions     Medication Sig Dispense Auth. Provider   nitrofurantoin, macrocrystal-monohydrate, (MACROBID) 100 MG capsule Take 1 capsule (100 mg total) by mouth 2 (two) times daily. 10 capsule Pamelia Botto, Jeannett Senior, FNP      PDMP not reviewed this encounter.   Charma Igo, FNP 07/24/22 1558    ImmordinoJeannett Senior, FNP 07/24/22 [phone]

## 2022-07-24 NOTE — ED Triage Notes (Signed)
Patient presents to UC for dysuria since this morning. No OTC meds for relief.

## 2022-07-26 ENCOUNTER — Telehealth: Payer: Self-pay

## 2022-07-26 ENCOUNTER — Telehealth (HOSPITAL_COMMUNITY): Payer: Self-pay | Admitting: Emergency Medicine

## 2022-07-26 LAB — URINE CULTURE: Culture: >=100000 — AB

## 2022-07-26 MED ORDER — SULFAMETHOXAZOLE-TRIMETHOPRIM 800-160 MG PO TABS: 1.0000 | ORAL_TABLET | Freq: Two times a day (BID) | ORAL | 0 refills | Status: DC

## 2022-07-26 NOTE — Telephone Encounter (Signed)
Patient contacted Urgent Care regarding her new antibiotic prescription. States that she has developed itching following first dose of Bactrim.   Patient discussed with Wendee Beavers NP. Advised to stop medication, take benadryl and seek further assistance at the ER if symptoms worsen. Call back nurse contacted.

## 2022-07-27 ENCOUNTER — Telehealth (HOSPITAL_COMMUNITY): Payer: Self-pay | Admitting: Emergency Medicine

## 2022-07-27 ENCOUNTER — Telehealth: Payer: Self-pay | Admitting: Family

## 2022-07-27 ENCOUNTER — Encounter: Payer: Self-pay | Admitting: Family

## 2022-07-27 ENCOUNTER — Other Ambulatory Visit: Payer: Self-pay | Admitting: Family

## 2022-07-27 DIAGNOSIS — Z8619 Personal history of other infectious and parasitic diseases: Secondary | ICD-10-CM

## 2022-07-27 MED ORDER — VALACYCLOVIR HCL 1 G PO TABS
ORAL_TABLET | ORAL | 2 refills | Status: AC
Start: 2022-07-27 — End: ?

## 2022-07-27 MED ORDER — CIPROFLOXACIN HCL 250 MG PO TABS: 250.0000 mg | ORAL_TABLET | Freq: Two times a day (BID) | ORAL | 0 refills | Status: AC

## 2022-07-27 NOTE — Telephone Encounter (Signed)
Pt called in staying that she went to UC because of a UTI, however, they gave her some antibiotics for it, and after they got the culture results back, they called her and said that she needs another type of antibiotics. After taking the second antibiotics, pt said she got an allergic reactions to it.  Pt was calling asking if [facility] can sent over some antibiotics to her pharmacy. I offered appts for pt, she denied because she was seen for this at [facility] and the results are in the system.

## 2022-07-27 NOTE — Telephone Encounter (Signed)
See patient MyChart message.  Urgent care sent in ciprofloxacin  I sent a response today

## 2022-07-28 NOTE — Telephone Encounter (Signed)
Noted response has already been sent to pt

## 2022-08-15 ENCOUNTER — Telehealth: Payer: Self-pay | Admitting: Family

## 2022-08-15 NOTE — Telephone Encounter (Signed)
Copied from CRM [id number]. Topic: Medicare AWV >> Aug 15, 2022  2:51 PM Payton Doughty wrote: Reason for CRM: LM 08/15/2022 to schedule AWV   Verlee Rossetti; Care Guide Ambulatory Clinical Support [place] [facility] Direct Dial: [phone]

## 2022-09-18 ENCOUNTER — Encounter: Payer: Self-pay | Admitting: Family

## 2022-10-10 ENCOUNTER — Ambulatory Visit (INDEPENDENT_AMBULATORY_CARE_PROVIDER_SITE_OTHER): Payer: PPO | Admitting: Family

## 2022-10-10 ENCOUNTER — Encounter: Payer: Self-pay | Admitting: Family

## 2022-10-10 VITALS — BP 132/70 | HR 89 | Temp 98.1°F | Ht 61.0 in | Wt 155.1 lb

## 2022-10-10 DIAGNOSIS — M2550 Pain in unspecified joint: Secondary | ICD-10-CM

## 2022-10-10 DIAGNOSIS — R5383 Other fatigue: Secondary | ICD-10-CM | POA: Diagnosis not present

## 2022-10-10 DIAGNOSIS — Z0000 Encounter for general adult medical examination without abnormal findings: Secondary | ICD-10-CM

## 2022-10-10 NOTE — Progress Notes (Signed)
Assessment & Plan:  Routine physical examination Assessment & Plan: Clinical breast exam performed today.  Deferred pelvic exam in the absence of pelvic complaints the patient is no longer screening for cervical cancer.  Encouraged continued walking.   Other fatigue Assessment & Plan: Etiology of fatigue is unknown at this time.  Discussed that I suspect multifactorial including caregiver fatigue.  Pending labs to evaluate for metabolic etiology.  Patient politely declines chest x-ray to evaluate for lymphadenopathy.  She has a history of breast cancer.  She will let me know how she is doing and fatigue does not improve, or most certainly persists  Orders: -     B12 and Folate Panel; Future -     CBC with Differential/Platelet; Future -     Comprehensive metabolic panel; Future -     Hemoglobin A1c; Future -     Iron, TIBC and Ferritin Panel; Future -     Lipid panel; Future -     TSH; Future -     Uric acid; Future -     VITAMIN D 25 Hydroxy (Vit-D Deficiency, Fractures); Future  Arthralgia, unspecified joint Assessment & Plan: Benign exam. Discussed if OA.  Diffuse presentation is not consistent with gout.  Improves with hydration. Advised trial OTC voltaren gel. Will monitor.   Orders: -     Uric acid; Future     Return precautions given.   Risks, benefits, and alternatives of the medications and treatment plan prescribed today were discussed, and patient expressed understanding.   Education regarding symptom management and diagnosis given to patient on AVS either electronically or printed.  Return in about 4 months (around 02/10/2023) for Fasting labs in 2-3 weeks.  Rennie Plowman, FNP  Subjective:    Patient ID: Kelly Mata, female    DOB: 22-Mar-1950, 72 y.o.   MRN: [id number]  CC: Kelly Mata is a 72 y.o. female who presents today for physical exam.    HPI: Complains of fatigue over the past couple of months.   She is working in [employer] site , one  day per week. Heat has been extreme.  She has a day to 'recover' after working on her feet.   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  He no longer drives. She is tearful.   She is sleeping well,  6- 7 hours. Sleep is restorative.   She walks her dog 1/2 mile , 20 minutes, per day.   No cp, palpitations, left arm numbness , cough, fever, weight loss.  She complains of distal portions of bilateral and all fingers and toes 'will ache', this is episodic.  No neck pain, discoloration, numbness, swelling.  No injury.   No relief with ibuprofen prn.   Symptoms are better with hydration.      Taking b12 daily.   Colorectal Cancer Screening: UTD, Dr Suezanne Jacquet, repeat in 10 years.  Breast Cancer Screening: Mammogram UTD; history of left breast cancer Cervical Cancer Screening: History of hysterectomy . had for noncancerous reasons, had fibroids. No ovaries. States no cervix.   bone Health screening/DEXA for 65+: UTD, 05/2020  Lung Cancer Screening: Doesn't have 20 year pack year history and age > 64 years yo 56 years         Tetanus - UTD        Pneumococcal - complete  Alcohol use: Occasional Smoking/tobacco use: Nonsmoker.    Health Maintenance  Topic Date Due   COVID-19 Vaccine (4 - 2023-24  season) 11/12/2021   Medicare Annual Wellness Visit  09/08/2022   Flu Shot  10/13/2022   Colon Cancer Screening  03/14/2024   Mammogram  06/01/2024   DTaP/Tdap/Td vaccine (3 - Td or Tdap) 12/04/2030   Pneumonia Vaccine  Completed   DEXA scan (bone density measurement)  Completed   Hepatitis C Screening  Completed   Zoster (Shingles) Vaccine  Completed   HPV Vaccine  Aged Out    ALLERGIES: Amlodipine, Ceclor [cefaclor], Ampicillin, and Bactrim [sulfamethoxazole-trimethoprim]  Current Outpatient Medications on File Prior to Visit  Medication Sig Dispense Refill   valACYclovir (VALTREX) 1000 MG tablet Take two tablets ( total 2000 mg) by mouth q12h x 1  day; Start: ASAP after symptom onset 30 tablet 2   Ascorbic Acid (VITAMIN C PO) Take by mouth daily.     BIOTIN PO Take by mouth daily.     Cyanocobalamin (VITAMIN B12 PO) Take by mouth daily.     HAWTHORN PO Take 2 capsules by mouth daily.     hydrochlorothiazide (MICROZIDE) 12.5 MG capsule Take 1 capsule (12.5 mg total) by mouth daily as needed. If Blood pressure > 130/80. 90 capsule 3   ibuprofen (ADVIL,MOTRIN) 200 MG tablet Take 200 mg by mouth every 6 (six) hours as needed.     Turmeric 400 MG CAPS Take 1 capsule by mouth daily.     VITAMIN D PO Take 1 capsule by mouth daily. 2000 IU     No current facility-administered medications on file prior to visit.    Review of Systems  Constitutional:  Positive for fatigue. Negative for chills, fever and unexpected weight change.  HENT:  Negative for congestion.   Respiratory:  Negative for cough.   Cardiovascular:  Negative for chest pain, palpitations and leg swelling.  Gastrointestinal:  Negative for nausea and vomiting.  Musculoskeletal:  Negative for arthralgias and myalgias.  Skin:  Negative for rash.  Neurological:  Negative for headaches.  Hematological:  Negative for adenopathy.  Psychiatric/Behavioral:  Negative for confusion and sleep disturbance. The patient is not nervous/anxious.       Objective:    BP 132/70   Pulse 89   Temp 98.1 F (36.7 C) (Oral)   Ht 5\' 1"  (1.549 m)   Wt 155 lb 1.6 oz (70.4 kg)   SpO2 98%   BMI 29.31 kg/m   BP Readings from Last 3 Encounters:  10/10/22 132/70  07/24/22 (!) 168/80  01/07/22 136/78   Wt Readings from Last 3 Encounters:  10/10/22 155 lb 1.6 oz (70.4 kg)  01/07/22 158 lb (71.7 kg)  09/07/21 159 lb (72.1 kg)    Physical Exam Vitals reviewed.  Constitutional:      Appearance: Normal appearance. She is well-developed.  Eyes:     Conjunctiva/sclera: Conjunctivae normal.  Neck:     Thyroid: No thyroid mass or thyromegaly.  Cardiovascular:     Rate and Rhythm: Normal  rate and regular rhythm.     Pulses: Normal pulses.     Heart sounds: Normal heart sounds.     Comments: Bilateral palpable pedal pulse Pulmonary:     Effort: Pulmonary effort is normal.     Breath sounds: Normal breath sounds. No wheezing, rhonchi or rales.  Chest:  Breasts:    Breasts are symmetrical.     Right: No inverted nipple, mass, nipple discharge, skin change or tenderness.     Left: No inverted nipple, mass, nipple discharge, skin change or tenderness.  Abdominal:  General: Bowel sounds are normal. There is no distension.     Palpations: Abdomen is soft. Abdomen is not rigid. There is no fluid wave or mass.     Tenderness: There is no abdominal tenderness. There is no guarding or rebound.  Musculoskeletal:     Right hand: Normal. No swelling, deformity, tenderness or bony tenderness. Normal range of motion. Normal strength. Normal capillary refill. Normal pulse.     Left hand: Normal. No swelling, deformity, tenderness or bony tenderness. Normal range of motion. Normal strength. Normal capillary refill. Normal pulse.     Right foot: Normal. No swelling or bony tenderness.     Left foot: Normal. No swelling or bony tenderness.     Comments: No bouchard's or heberden's nodes. No ulnar deviation.   Lymphadenopathy:     Head:     Right side of head: No submental, submandibular, tonsillar, preauricular, posterior auricular or occipital adenopathy.     Left side of head: No submental, submandibular, tonsillar, preauricular, posterior auricular or occipital adenopathy.     Cervical: No cervical adenopathy.     Right cervical: No superficial, deep or posterior cervical adenopathy.    Left cervical: No superficial, deep or posterior cervical adenopathy.  Skin:    General: Skin is warm and dry.  Neurological:     Mental Status: She is alert.  Psychiatric:        Speech: Speech normal.        Behavior: Behavior normal.        Thought Content: Thought content normal.

## 2022-10-10 NOTE — Assessment & Plan Note (Signed)
Clinical breast exam performed today.  Deferred pelvic exam in the absence of pelvic complaints the patient is no longer screening for cervical cancer.  Encouraged continued walking.

## 2022-10-10 NOTE — Assessment & Plan Note (Addendum)
Benign exam. Discussed if OA.  Diffuse presentation is not consistent with gout.  Improves with hydration. Advised trial OTC voltaren gel. Will monitor.

## 2022-10-10 NOTE — Assessment & Plan Note (Addendum)
Etiology of fatigue is unknown at this time.  Discussed that I suspect multifactorial including caregiver fatigue.  Pending labs to evaluate for metabolic etiology.  Patient politely declines chest x-ray to evaluate for lymphadenopathy.  She has a history of breast cancer.  She will let me know how she is doing and fatigue does not improve, or most certainly persists

## 2022-10-10 NOTE — Patient Instructions (Addendum)
Trial of Voltaren gel over-the-counter which is an anti-inflammatory for foot and hand pain.  Most importantly, please let me know how you are feeling as it relates to fatigue and most certainly if this symptom persists.  Health Maintenance for Postmenopausal Women Menopause is a normal process in which your ability to get pregnant comes to an end. This process happens slowly over many months or years, usually between the ages of 38 and 42. Menopause is complete when you have missed your menstrual period for 12 months. It is important to talk with your health care provider about some of the most common conditions that affect women after menopause (postmenopausal women). These include heart disease, cancer, and bone loss (osteoporosis). Adopting a healthy lifestyle and getting preventive care can help to promote your health and wellness. The actions you take can also lower your chances of developing some of these common conditions. What are the signs and symptoms of menopause? During menopause, you may have the following symptoms: Hot flashes. These can be moderate or severe. Night sweats. Decrease in sex drive. Mood swings. Headaches. Tiredness (fatigue). Irritability. Memory problems. Problems falling asleep or staying asleep. Talk with your health care provider about treatment options for your symptoms. Do I need hormone replacement therapy? Hormone replacement therapy is effective in treating symptoms that are caused by menopause, such as hot flashes and night sweats. Hormone replacement carries certain risks, especially as you become older. If you are thinking about using estrogen or estrogen with progestin, discuss the benefits and risks with your health care provider. How can I reduce my risk for heart disease and stroke? The risk of heart disease, heart attack, and stroke increases as you age. One of the causes may be a change in the body's hormones during menopause. This can affect how  your body uses dietary fats, triglycerides, and cholesterol. Heart attack and stroke are medical emergencies. There are many things that you can do to help prevent heart disease and stroke. Watch your blood pressure High blood pressure causes heart disease and increases the risk of stroke. This is more likely to develop in people who have high blood pressure readings or are overweight. Have your blood pressure checked: Every 3-5 years if you are 68-91 years of age. Every year if you are 46 years old or older. Eat a healthy diet  Eat a diet that includes plenty of vegetables, fruits, low-fat dairy products, and lean protein. Do not eat a lot of foods that are high in solid fats, added sugars, or sodium. Get regular exercise Get regular exercise. This is one of the most important things you can do for your health. Most adults should: Try to exercise for at least 150 minutes each week. The exercise should increase your heart rate and make you sweat (moderate-intensity exercise). Try to do strengthening exercises at least twice each week. Do these in addition to the moderate-intensity exercise. Spend less time sitting. Even light physical activity can be beneficial. Other tips Work with your health care provider to achieve or maintain a healthy weight. Do not use any products that contain nicotine or tobacco. These products include cigarettes, chewing tobacco, and vaping devices, such as e-cigarettes. If you need help quitting, ask your health care provider. Know your numbers. Ask your health care provider to check your cholesterol and your blood sugar (glucose). Continue to have your blood tested as directed by your health care provider. Do I need screening for cancer? Depending on your health history and family  history, you may need to have cancer screenings at different stages of your life. This may include screening for: Breast cancer. Cervical cancer. Lung cancer. Colorectal cancer. What is  my risk for osteoporosis? After menopause, you may be at increased risk for osteoporosis. Osteoporosis is a condition in which bone destruction happens more quickly than new bone creation. To help prevent osteoporosis or the bone fractures that can happen because of osteoporosis, you may take the following actions: If you are 40-76 years old, get at least 1,000 mg of calcium and at least 600 international units (IU) of vitamin D per day. If you are older than age 21 but younger than age 78, get at least 1,200 mg of calcium and at least 600 international units (IU) of vitamin D per day. If you are older than age 63, get at least 1,200 mg of calcium and at least 800 international units (IU) of vitamin D per day. Smoking and drinking excessive alcohol increase the risk of osteoporosis. Eat foods that are rich in calcium and vitamin D, and do weight-bearing exercises several times each week as directed by your health care provider. How does menopause affect my mental health? Depression may occur at any age, but it is more common as you become older. Common symptoms of depression include: Feeling depressed. Changes in sleep patterns. Changes in appetite or eating patterns. Feeling an overall lack of motivation or enjoyment of activities that you previously enjoyed. Frequent crying spells. Talk with your health care provider if you think that you are experiencing any of these symptoms. General instructions See your health care provider for regular wellness exams and vaccines. This may include: Scheduling regular health, dental, and eye exams. Getting and maintaining your vaccines. These include: Influenza vaccine. Get this vaccine each year before the flu season begins. Pneumonia vaccine. Shingles vaccine. Tetanus, diphtheria, and pertussis (Tdap) booster vaccine. Your health care provider may also recommend other immunizations. Tell your health care provider if you have ever been abused or do not  feel safe at home. Summary Menopause is a normal process in which your ability to get pregnant comes to an end. This condition causes hot flashes, night sweats, decreased interest in sex, mood swings, headaches, or lack of sleep. Treatment for this condition may include hormone replacement therapy. Take actions to keep yourself healthy, including exercising regularly, eating a healthy diet, watching your weight, and checking your blood pressure and blood sugar levels. Get screened for cancer and depression. Make sure that you are up to date with all your vaccines. This information is not intended to replace advice given to you by your health care provider. Make sure you discuss any questions you have with your health care provider. Document Revised: 07/20/2020 Document Reviewed: 07/20/2020 Elsevier Patient Education  2024 [employer].

## 2022-11-01 ENCOUNTER — Other Ambulatory Visit (INDEPENDENT_AMBULATORY_CARE_PROVIDER_SITE_OTHER): Payer: PPO

## 2022-11-01 DIAGNOSIS — M2550 Pain in unspecified joint: Secondary | ICD-10-CM | POA: Diagnosis not present

## 2022-11-01 DIAGNOSIS — R5383 Other fatigue: Secondary | ICD-10-CM

## 2022-11-01 LAB — COMPREHENSIVE METABOLIC PANEL
ALT: 15 U/L (ref 0–35)
AST: 16 U/L (ref 0–37)
Albumin: 4.1 g/dL (ref 3.5–5.2)
Alkaline Phosphatase: 83 U/L (ref 39–117)
BUN: 15 mg/dL (ref 6–23)
CO2: 27 mEq/L (ref 19–32)
Calcium: 9.2 mg/dL (ref 8.4–10.5)
Chloride: 106 mEq/L (ref 96–112)
Creatinine, Ser: 0.87 mg/dL (ref 0.40–1.20)
GFR: 66.65 mL/min (ref >60.00)
Glucose, Bld: 90 mg/dL (ref 70–99)
Potassium: 4.3 mEq/L (ref 3.5–5.1)
Sodium: 140 mEq/L (ref 135–145)
Total Bilirubin: 0.5 mg/dL (ref 0.2–1.2)
Total Protein: 6.6 g/dL (ref 6.0–8.3)

## 2022-11-01 LAB — CBC WITH DIFFERENTIAL/PLATELET
Basophils Absolute: 0.1 10*3/uL (ref 0.0–0.1)
Basophils Relative: 1.1 % (ref 0.0–3.0)
Eosinophils Absolute: 0.1 10*3/uL (ref 0.0–0.7)
Eosinophils Relative: 1.9 % (ref 0.0–5.0)
HCT: 37.8 % (ref 36.0–46.0)
Hemoglobin: 12 g/dL (ref 12.0–15.0)
Lymphocytes Relative: 24.8 % (ref 12.0–46.0)
Lymphs Abs: 1.6 10*3/uL (ref 0.7–4.0)
MCHC: 31.8 g/dL (ref 30.0–36.0)
MCV: 92.6 fl (ref 78.0–100.0)
Monocytes Absolute: 0.6 10*3/uL (ref 0.1–1.0)
Monocytes Relative: 9.6 % (ref 3.0–12.0)
Neutro Abs: 4.1 10*3/uL (ref 1.4–7.7)
Neutrophils Relative %: 62.6 % (ref 43.0–77.0)
Platelets: 264 10*3/uL (ref 150.0–400.0)
RBC: 4.08 Mil/uL (ref 3.87–5.11)
RDW: 13.5 % (ref 11.5–15.5)
WBC: 6.6 10*3/uL (ref 4.0–10.5)

## 2022-11-01 LAB — VITAMIN D 25 HYDROXY (VIT D DEFICIENCY, FRACTURES): VITD: 39.93 ng/mL (ref 30.00–100.00)

## 2022-11-01 LAB — LIPID PANEL
Cholesterol: 208 mg/dL — ABNORMAL HIGH (ref 0–200)
HDL: 55.7 mg/dL (ref >39.00)
LDL Cholesterol: 130 mg/dL — ABNORMAL HIGH (ref 0–99)
NonHDL: 151.84
Total CHOL/HDL Ratio: 4
Triglycerides: 107 mg/dL (ref 0.0–149.0)
VLDL: 21.4 mg/dL (ref 0.0–40.0)

## 2022-11-01 LAB — TSH: TSH: 3.01 u[IU]/mL (ref 0.35–5.50)

## 2022-11-01 LAB — B12 AND FOLATE PANEL
Folate: 13.1 ng/mL (ref >5.9)
Vitamin B-12: 1128 pg/mL — ABNORMAL HIGH (ref 211–911)

## 2022-11-01 LAB — URIC ACID: Uric Acid, Serum: 5.6 mg/dL (ref 2.4–7.0)

## 2022-11-01 LAB — HEMOGLOBIN A1C: Hgb A1c MFr Bld: 5.8 % (ref 4.6–6.5)

## 2022-11-02 LAB — IRON,TIBC AND FERRITIN PANEL
%SAT: 32 % (ref 16–45)
Ferritin: 59 ng/mL (ref 16–288)
Iron: 113 ug/dL (ref 45–160)
TIBC: 351 ug/dL (ref 250–450)

## 2022-11-10 ENCOUNTER — Ambulatory Visit (INDEPENDENT_AMBULATORY_CARE_PROVIDER_SITE_OTHER): Payer: PPO | Admitting: Emergency Medicine

## 2022-11-10 VITALS — Ht 61.5 in | Wt 157.0 lb

## 2022-11-10 DIAGNOSIS — Z0000 Encounter for general adult medical examination without abnormal findings: Secondary | ICD-10-CM | POA: Diagnosis not present

## 2022-11-10 DIAGNOSIS — Z780 Asymptomatic menopausal state: Secondary | ICD-10-CM

## 2022-11-10 DIAGNOSIS — Z1231 Encounter for screening mammogram for malignant neoplasm of breast: Secondary | ICD-10-CM

## 2022-11-10 NOTE — Progress Notes (Signed)
Subjective:   Kelly Mata is a 72 y.o. female who presents for Medicare Annual (Subsequent) preventive examination.  Visit Complete: Virtual  I connected with  [name] on 11/10/22 by a audio enabled telemedicine application and verified that I am speaking with the correct person using two identifiers.  Patient Location: Home  Provider Location: Home Office  I discussed the limitations of evaluation and management by telemedicine. The patient expressed understanding and agreed to proceed.  Vital Signs: Because this visit was a virtual/telehealth visit, some criteria may be missing or patient reported. Any vitals not documented were not able to be obtained and vitals that have been documented are patient reported.    Review of Systems     Cardiac Risk Factors include: advanced age (>38men, >7 women);hypertension;dyslipidemia     Objective:    Today's Vitals   11/10/22 1327  Weight: 157 lb (71.2 kg)  Height: 5' 1.5" (1.562 m)   Body mass index is 29.18 kg/m.     11/10/2022    1:37 PM 09/07/2021    3:31 PM 09/28/2020   12:17 PM 09/02/2020    8:41 AM 09/02/2019    8:34 AM 04/16/2019    9:23 AM 08/31/2018    8:43 AM  Advanced Directives  Does Patient Have a Medical Advance Directive? Yes Yes No Yes Yes No Yes  Type of Estate agent of [place];Living will Healthcare Power of [place];Living will  Healthcare Power of [place];Living will Healthcare Power of [place];Living will  Healthcare Power of [place];Living will  Does patient want to make changes to medical advance directive? No - Patient declined No - Patient declined  No - Patient declined No - Patient declined  No - Patient declined  Copy of Healthcare Power of Attorney in Chart? No - copy requested No - copy requested  No - copy requested No - copy requested  No - copy requested  Would patient like information on creating a medical advance directive?      No - Patient declined      Current Medications (verified) Outpatient Encounter Medications as of 11/10/2022  Medication Sig   Ascorbic Acid (VITAMIN C PO) Take by mouth daily.   BIOTIN PO Take by mouth daily.   HAWTHORN PO Take 2 capsules by mouth daily.   hydrochlorothiazide (MICROZIDE) 12.5 MG capsule Take 1 capsule (12.5 mg total) by mouth daily as needed. If Blood pressure > 130/80.   ibuprofen (ADVIL,MOTRIN) 200 MG tablet Take 200 mg by mouth every 6 (six) hours as needed.   Turmeric 400 MG CAPS Take 1 capsule by mouth daily.   valACYclovir (VALTREX) 1000 MG tablet Take two tablets ( total 2000 mg) by mouth q12h x 1 day; Start: ASAP after symptom onset   VITAMIN D PO Take 1 capsule by mouth daily. 2000 IU   Cyanocobalamin (VITAMIN B12 PO) Take by mouth daily. (Patient not taking: Reported on 11/10/2022)   No facility-administered encounter medications on file as of 11/10/2022.    Allergies (verified) Amlodipine, Ceclor [cefaclor], Ampicillin, and Bactrim [sulfamethoxazole-trimethoprim]   History: Past Medical History:  Diagnosis Date   Allergy    Breast cancer (HCC) 2009   neg   Cancer (HCC) 2009   left breast DCIS, 5 yrs tamoxifen   Headache    Heart murmur 2008   Personal history of radiation therapy    Squamous cell carcinoma    Past Surgical History:  Procedure Laterality Date   ABDOMINAL HYSTERECTOMY  1998  had for noncancerous reasons, had fibroids. No ovaries. States no cervix.    BREAST BIOPSY Left 2009   stereo breast biopsy, +   BREAST CYST ASPIRATION  90s   BREAST EXCISIONAL BIOPSY Left 2008   neg   BREAST EXCISIONAL BIOPSY Left 1998   BREAST LUMPECTOMY Left 2009   radiation   COLONOSCOPY  2013   OOPHORECTOMY     TONSILLECTOMY  1957   Family History  Problem Relation Age of Onset   Cancer Other        cousin, breast   Cancer Father        pancreatic (died at age 98)   Breast cancer Cousin 17   Cancer Mother        skin - unsure of type but thinks it was melanoma (died  at age 76)   Cancer Maternal Aunt    Diabetes Daughter    Social History   Socioeconomic History   Marital status: Married    Spouse name: Daphine Deutscher   Number of children: 2   Years of education: 16   Highest education level: Bachelor's degree (e.g., BA, AB, BS)  Occupational History   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   Caffeine use:  1 cup per day.   Social Determinants of Health   Financial Resource Strain: Low Risk  (11/10/2022)   Overall Financial Resource Strain (CARDIA)    Difficulty of Paying Living Expenses: Not hard at all  Food Insecurity: No Food Insecurity (11/10/2022)   Hunger Vital Sign    Worried About Running Out of Food in the Last Year: Never true    Ran Out of Food in the Last Year: Never true  Transportation Needs: No Transportation Needs (11/10/2022)   PRAPARE - Administrator, Civil Service (Medical): No    Lack of Transportation (Non-Medical): No  Physical Activity: Unknown (11/10/2022)   Exercise Vital Sign    Days of Exercise per Week: 7 days    Minutes of Exercise per Session: Not on file  Stress: No Stress Concern Present (11/10/2022)   [employer] of Occupational Health - Occupational Stress Questionnaire    Feeling of Stress : Not at all  Social Connections: Socially Integrated (11/10/2022)   Social Connection and Isolation Panel [NHANES]    Frequency of Communication with Friends and Family: More than three times a week    Frequency of Social Gatherings with  Friends and Family: More than three times a week    Attends Religious Services: More than 4 times per year    Active Member of [employer] or Organizations: Yes    Attends Engineer, structural: More than 4 times per year    Marital Status: Married    Tobacco Counseling Counseling given: Not Answered   Clinical Intake:  Pre-visit preparation completed: Yes  Pain : No/denies pain  BMI - recorded: 29.18 Nutritional Status: BMI 25 -29 Overweight Nutritional Risks: None Diabetes: No  How often do you need to have someone help you when you read instructions, pamphlets, or other written materials from your doctor or pharmacy?: 1 - Never  Interpreter Needed?: No  Information entered by :: Tora Kindred, CMA   Activities of Daily Living    11/10/2022    1:29 PM  In your present state of health, do you have any difficulty performing the following activities:  Hearing? 0  Vision? 0  Difficulty concentrating or making decisions? 0  Walking or climbing stairs? 0  Dressing or bathing? 0  Doing errands, shopping? 0  Preparing Food and eating ? N  Using the Toilet? N  In the past six months, have you accidently leaked urine? N  Do you have problems with loss of bowel control? N  Managing your Medications? N  Managing your Finances? N  Housekeeping or managing your Housekeeping? N    Patient Care Team: Allegra Grana, FNP as PCP - General (Family Medicine) Kieth Brightly, MD (General Surgery) Arvil Chaco, Belia Heman, MD (Unknown Physician Specialty) Midge Minium, MD as Consulting Physician (Gastroenterology)  Indicate any recent Medical Services you may have received from other than [facility] providers in the past year (date may be approximate).     Assessment:   This is a routine wellness examination for [place].  Hearing/Vision screen Hearing Screening - Comments:: Denies hearing loss Vision Screening - Comments:: Gets routine eye exams  Dietary issues and  exercise activities discussed:     Goals Addressed               This Visit's Progress     Patient Stated (pt-stated)        Eat healthier      Depression Screen    11/10/2022    1:35 PM 10/10/2022    8:24 AM 01/07/2022    8:12 AM 09/07/2021    3:32 PM 07/07/2021    8:35 AM 09/02/2020    8:36 AM 03/04/2020    9:51 AM  PHQ 2/9 Scores  PHQ - 2 Score 0 0 0 0 0 0 0  PHQ- 9 Score 0 0   0  3    Fall Risk    11/10/2022    1:38 PM 10/10/2022    8:22 AM 01/07/2022    8:12 AM 09/07/2021    3:31 PM 07/07/2021    8:35 AM  Fall Risk   Falls in the past year? 0 0 0 0 0  Number falls in past yr: 0 0 0  0  Injury with Fall? 0 0 0  0  Risk for fall due to : No Fall Risks No Fall Risks No Fall Risks  No Fall Risks  Follow up Falls prevention discussed Falls evaluation completed Falls evaluation completed Falls evaluation completed Falls evaluation completed    MEDICARE RISK AT HOME: Medicare Risk at Home Any stairs in or around the home?: Yes If so, are there any without handrails?: No Home free of loose throw rugs in walkways, pet beds, electrical cords, etc?: Yes Adequate lighting in your home to reduce risk of falls?: Yes Life alert?: No Use of a cane, walker or w/c?: No Grab bars in the bathroom?: No Shower chair or bench in shower?: No Elevated toilet seat or a handicapped toilet?: Yes  TIMED UP AND GO:  Was the test performed?  No    Cognitive Function:  11/10/2022    1:39 PM 09/02/2019    8:43 AM 08/31/2018    8:50 AM 08/30/2017    9:42 AM  6CIT Screen  What Year? 0 points 0 points 0 points 0 points  What month? 0 points 0 points 0 points 0 points  What time? 0 points  0 points 0 points  Count back from 20 0 points  0 points 0 points  Months in reverse 0 points 0 points 0 points 0 points  Repeat phrase 0 points 0 points  0 points  Total Score 0 points   0 points    Immunizations Immunization History  Administered Date(s) Administered    Influenza-Unspecified 01/10/2017   Moderna Sars-Covid-2 Vaccination 04/07/2020   PFIZER(Purple Top)SARS-COV-2 Vaccination 05/06/2019, 05/27/2019   Pneumococcal Conjugate-13 06/30/2016   Pneumococcal Polysaccharide-23 08/30/2017   Td 08/13/2007   Tdap 12/03/2020   Zoster Recombinant(Shingrix) 11/06/2017, 02/02/2018    TDAP status: Up to date  Flu Vaccine status: Declined, Education has been provided regarding the importance of this vaccine but patient still declined. Advised may receive this vaccine at local pharmacy or Health Dept. Aware to provide a copy of the vaccination record if obtained from local pharmacy or Health Dept. Verbalized acceptance and understanding.  Pneumococcal vaccine status: Up to date  Covid-19 vaccine status: Information provided on how to obtain vaccines.   Qualifies for Shingles Vaccine? Yes   Zostavax completed No   Shingrix Completed?: Yes  Screening Tests Health Maintenance  Topic Date Due   COVID-19 Vaccine (4 - 2023-24 season) 11/12/2021   DEXA SCAN  04/16/2022   INFLUENZA VACCINE  10/13/2022   Medicare Annual Wellness (AWV)  11/10/2023   Colonoscopy  03/14/2024   MAMMOGRAM  06/01/2024   DTaP/Tdap/Td (3 - Td or Tdap) 12/04/2030   Pneumonia Vaccine 73+ Years old  Completed   Hepatitis C Screening  Completed   Zoster Vaccines- Shingrix  Completed   HPV VACCINES  Aged Out    Health Maintenance  Health Maintenance Due  Topic Date Due   COVID-19 Vaccine (4 - 2023-24 season) 11/12/2021   DEXA SCAN  04/16/2022   INFLUENZA VACCINE  10/13/2022    Colorectal cancer screening: Type of screening: Colonoscopy. Completed 03/14/14. Repeat every 10 years  Mammogram status: Completed 06/02/22. Repeat every year  Bone Density status: Completed 04/16/20. Results reflect: Bone density results: OSTEOPENIA. Repeat every 2 years.  Lung Cancer Screening: (Low Dose CT Chest recommended if Age 73-80 years, 20 pack-year currently smoking OR have quit w/in  15years.) does not qualify.   Lung Cancer Screening Referral: n/a  Additional Screening:  Hepatitis C Screening: does not qualify; Completed 08/04/16  Vision Screening: Recommended annual ophthalmology exams for early detection of glaucoma and other disorders of the eye.  Dental Screening: Recommended annual dental exams for proper oral hygiene   Community Resource Referral / Chronic Care Management: CRR required this visit?  No   CCM required this visit?  No     Plan:     I have personally reviewed and noted the following in the patient's chart:   Medical and social history Use of alcohol, tobacco or illicit drugs  Current medications and supplements including opioid prescriptions. Patient is not currently taking opioid prescriptions. Functional ability and status Nutritional status Physical activity Advanced directives List of other physicians Hospitalizations, surgeries, and ER visits in previous 12 months Vitals Screenings to include cognitive, depression, and falls Referrals and appointments  In addition, I have reviewed and discussed with patient certain  preventive protocols, quality metrics, and best practice recommendations. A written personalized care plan for preventive services as well as general preventive health recommendations were provided to patient.     Tora Kindred, CMA   11/10/2022   After Visit Summary: (MyChart) Due to this being a telephonic visit, the after visit summary with patients personalized plan was offered to patient via MyChart   Nurse Notes:  Placed orders for [facility] & DEXA scan to be done 05/2023. Patient declined flu vaccine Will get covid booster when available

## 2022-11-10 NOTE — Patient Instructions (Addendum)
Kelly Mata , Thank you for taking time to come for your Medicare Wellness Visit. I appreciate your ongoing commitment to your health goals. Please review the following plan we discussed and let me know if I can assist you in the future.   Referrals/Orders/Follow-Ups/Clinician Recommendations: Get the covid booster when available. I have placed orders for a mammogram and bone density scan due after 06/02/22. Call [facility] [facility] to schedule these @ [phone]. Call early so that you can get both tests done in the same visit.  This is a list of the screening recommended for you and due dates:  Health Maintenance  Topic Date Due   COVID-19 Vaccine (4 - 2023-24 season) 11/12/2021   DEXA scan (bone density measurement)  04/16/2022   Flu Shot  10/13/2022   Mammogram  06/02/2023   Medicare Annual Wellness Visit  11/10/2023   Colon Cancer Screening  03/14/2024   DTaP/Tdap/Td vaccine (3 - Td or Tdap) 12/04/2030   Pneumonia Vaccine  Completed   Hepatitis C Screening  Completed   Zoster (Shingles) Vaccine  Completed   HPV Vaccine  Aged Out    Advanced directives: (ACP Link)Information on Advanced Care Planning can be found at [facility] of [place] Advance Health Care Directives Advance Health Care Directives ([email or web address]   Next Medicare Annual Wellness Visit scheduled for next year: Yes, 11/16/23 @ 1:30pm

## 2022-12-14 DIAGNOSIS — Z08 Encounter for follow-up examination after completed treatment for malignant neoplasm: Secondary | ICD-10-CM | POA: Diagnosis not present

## 2022-12-14 DIAGNOSIS — D2271 Melanocytic nevi of right lower limb, including hip: Secondary | ICD-10-CM | POA: Diagnosis not present

## 2022-12-14 DIAGNOSIS — R202 Paresthesia of skin: Secondary | ICD-10-CM | POA: Diagnosis not present

## 2022-12-14 DIAGNOSIS — D2262 Melanocytic nevi of left upper limb, including shoulder: Secondary | ICD-10-CM | POA: Diagnosis not present

## 2022-12-14 DIAGNOSIS — Z85828 Personal history of other malignant neoplasm of skin: Secondary | ICD-10-CM | POA: Diagnosis not present

## 2022-12-14 DIAGNOSIS — D2261 Melanocytic nevi of right upper limb, including shoulder: Secondary | ICD-10-CM | POA: Diagnosis not present

## 2022-12-14 DIAGNOSIS — D225 Melanocytic nevi of trunk: Secondary | ICD-10-CM | POA: Diagnosis not present

## 2022-12-14 DIAGNOSIS — D2272 Melanocytic nevi of left lower limb, including hip: Secondary | ICD-10-CM | POA: Diagnosis not present

## 2023-01-04 ENCOUNTER — Telehealth: Payer: PPO | Admitting: Physician Assistant

## 2023-01-04 DIAGNOSIS — R3989 Other symptoms and signs involving the genitourinary system: Secondary | ICD-10-CM

## 2023-01-04 MED ORDER — CIPROFLOXACIN HCL 250 MG PO TABS
250.0000 mg | ORAL_TABLET | Freq: Two times a day (BID) | ORAL | 0 refills | Status: AC
Start: 2023-01-04 — End: 2023-01-07

## 2023-01-04 NOTE — Progress Notes (Signed)
E-Visit for Urinary Problems  We are sorry that you are not feeling well.  Here is how we plan to help!  Based on what you shared with me it looks like you most likely have a simple urinary tract infection.  A UTI (Urinary Tract Infection) is a bacterial infection of the bladder.  Most cases of urinary tract infections are simple to treat but a key part of your care is to encourage you to drink plenty of fluids and watch your symptoms carefully.  I have prescribed Cipro 250mg  Take 1 tablet twice daily for 3 days.  Your symptoms should gradually improve. Call us if the burning in your urine worsens, you develop worsening fever, back pain or pelvic pain or if your symptoms do not resolve after completing the antibiotic.  Urinary tract infections can be prevented by drinking plenty of water to keep your body hydrated.  Also be sure when you wipe, wipe from front to back and don't hold it in!  If possible, empty your bladder every 4 hours.  HOME CARE Drink plenty of fluids Compete the full course of the antibiotics even if the symptoms resolve Remember, when you need to go.go. Holding in your urine can increase the likelihood of getting a UTI! GET HELP RIGHT AWAY IF: You cannot urinate You get a high fever Worsening back pain occurs You see blood in your urine You feel sick to your stomach or throw up You feel like you are going to pass out  MAKE SURE YOU  Understand these instructions. Will watch your condition. Will get help right away if you are not doing well or get worse.   Thank you for choosing an e-visit.  Your e-visit answers were reviewed by a board certified advanced clinical practitioner to complete your personal care plan. Depending upon the condition, your plan could have included both over the counter or prescription medications.  Please review your pharmacy choice. Make sure the pharmacy is open so you can pick up prescription now. If there is a problem, you may  contact your provider through [employer] and have the prescription routed to another pharmacy.  Your safety is important to [place]. If you have drug allergies check your prescription carefully.   For the next 24 hours you can use MyChart to ask questions about today's visit, request a non-urgent call back, or ask for a work or school excuse. You will get an email in the next two days asking about your experience. I hope that your e-visit has been valuable and will speed your recovery.  I have spent 5 minutes in review of e-visit questionnaire, review and updating patient chart, medical decision making and response to patient.   Margaretann Loveless, PA-C

## 2023-03-25 DIAGNOSIS — N390 Urinary tract infection, site not specified: Secondary | ICD-10-CM | POA: Diagnosis not present

## 2023-05-01 DIAGNOSIS — M1712 Unilateral primary osteoarthritis, left knee: Secondary | ICD-10-CM | POA: Diagnosis not present

## 2023-05-11 DIAGNOSIS — M25562 Pain in left knee: Secondary | ICD-10-CM | POA: Diagnosis not present

## 2023-05-31 ENCOUNTER — Other Ambulatory Visit: Payer: Self-pay | Admitting: Family

## 2023-05-31 DIAGNOSIS — I159 Secondary hypertension, unspecified: Secondary | ICD-10-CM

## 2023-05-31 MED ORDER — HYDROCHLOROTHIAZIDE 12.5 MG PO CAPS
12.5000 mg | ORAL_CAPSULE | Freq: Every day | ORAL | 3 refills | Status: AC | PRN
Start: 2023-05-31 — End: ?

## 2023-06-01 DIAGNOSIS — M25562 Pain in left knee: Secondary | ICD-10-CM | POA: Diagnosis not present

## 2023-06-06 DIAGNOSIS — M25562 Pain in left knee: Secondary | ICD-10-CM | POA: Diagnosis not present

## 2023-06-08 DIAGNOSIS — L03114 Cellulitis of left upper limb: Secondary | ICD-10-CM | POA: Diagnosis not present

## 2023-06-22 DIAGNOSIS — M25562 Pain in left knee: Secondary | ICD-10-CM | POA: Diagnosis not present

## 2023-06-29 DIAGNOSIS — M25562 Pain in left knee: Secondary | ICD-10-CM | POA: Diagnosis not present

## 2023-07-03 DIAGNOSIS — M1712 Unilateral primary osteoarthritis, left knee: Secondary | ICD-10-CM | POA: Diagnosis not present

## 2023-07-06 ENCOUNTER — Encounter: Payer: Self-pay | Admitting: Family

## 2023-07-06 ENCOUNTER — Ambulatory Visit
Admission: RE | Admit: 2023-07-06 | Discharge: 2023-07-06 | Disposition: A | Discharge status: Home / Self Care | Source: Ambulatory Visit | Attending: Family | Admitting: Family

## 2023-07-06 ENCOUNTER — Ambulatory Visit
Admission: RE | Admit: 2023-07-06 | Discharge: 2023-07-06 | Discharge status: Home / Self Care | Source: Ambulatory Visit | Attending: Family

## 2023-07-06 DIAGNOSIS — M810 Age-related osteoporosis without current pathological fracture: Secondary | ICD-10-CM | POA: Diagnosis not present

## 2023-07-06 DIAGNOSIS — Z1231 Encounter for screening mammogram for malignant neoplasm of breast: Secondary | ICD-10-CM | POA: Diagnosis not present

## 2023-07-06 DIAGNOSIS — Z780 Asymptomatic menopausal state: Secondary | ICD-10-CM | POA: Diagnosis not present

## 2023-07-13 ENCOUNTER — Other Ambulatory Visit: Payer: Self-pay

## 2023-07-13 ENCOUNTER — Emergency Department

## 2023-07-13 ENCOUNTER — Ambulatory Visit (INDEPENDENT_AMBULATORY_CARE_PROVIDER_SITE_OTHER): Admitting: Family

## 2023-07-13 ENCOUNTER — Emergency Department
Admission: EM | Admit: 2023-07-13 | Discharge: 2023-07-13 | Disposition: A | Discharge status: Home / Self Care | Attending: Emergency Medicine | Admitting: Emergency Medicine

## 2023-07-13 VITALS — BP 150/78 | HR 99 | Temp 98.1°F | Ht 61.0 in | Wt 156.2 lb

## 2023-07-13 DIAGNOSIS — R0789 Other chest pain: Secondary | ICD-10-CM | POA: Insufficient documentation

## 2023-07-13 DIAGNOSIS — R1013 Epigastric pain: Secondary | ICD-10-CM | POA: Diagnosis not present

## 2023-07-13 DIAGNOSIS — R079 Chest pain, unspecified: Secondary | ICD-10-CM

## 2023-07-13 DIAGNOSIS — M810 Age-related osteoporosis without current pathological fracture: Secondary | ICD-10-CM | POA: Diagnosis not present

## 2023-07-13 LAB — CBC
HCT: 41.5 % (ref 36.0–46.0)
Hemoglobin: 13.6 g/dL (ref 12.0–15.0)
MCH: 30.3 pg (ref 26.0–34.0)
MCHC: 32.8 g/dL (ref 30.0–36.0)
MCV: 92.4 fL (ref 80.0–100.0)
Platelets: 286 10*3/uL (ref 150–400)
RBC: 4.49 MIL/uL (ref 3.87–5.11)
RDW: 12.8 % (ref 11.5–15.5)
WBC: 12.8 10*3/uL — ABNORMAL HIGH (ref 4.0–10.5)
nRBC: 0 % (ref 0.0–0.2)

## 2023-07-13 LAB — HEPATIC FUNCTION PANEL
ALT: 20 U/L (ref 0–44)
AST: 19 U/L (ref 15–41)
Albumin: 4.4 g/dL (ref 3.5–5.0)
Alkaline Phosphatase: 72 U/L (ref 38–126)
Bilirubin, Direct: <0.1 mg/dL (ref 0.0–0.2)
Total Bilirubin: 0.6 mg/dL (ref 0.0–1.2)
Total Protein: 7.4 g/dL (ref 6.5–8.1)

## 2023-07-13 LAB — BASIC METABOLIC PANEL WITH GFR
Anion gap: 13 (ref 5–15)
BUN: 14 mg/dL (ref 8–23)
CO2: 20 mmol/L — ABNORMAL LOW (ref 22–32)
Calcium: 9.6 mg/dL (ref 8.9–10.3)
Chloride: 105 mmol/L (ref 98–111)
Creatinine, Ser: 0.87 mg/dL (ref 0.44–1.00)
GFR, Estimated: >60 mL/min (ref >60)
Glucose, Bld: 102 mg/dL — ABNORMAL HIGH (ref 70–99)
Potassium: 3.8 mmol/L (ref 3.5–5.1)
Sodium: 138 mmol/L (ref 135–145)

## 2023-07-13 LAB — TROPONIN I (HIGH SENSITIVITY)
Troponin I (High Sensitivity): <2 ng/L (ref <18)
Troponin I (High Sensitivity): 2 ng/L (ref <18)

## 2023-07-13 LAB — LIPASE, BLOOD: Lipase: 29 U/L (ref 11–51)

## 2023-07-13 MED ORDER — ALUM & MAG HYDROXIDE-SIMETH 200-200-20 MG/5ML PO SUSP
30.0000 mL | Freq: Once | ORAL | Status: AC
  Administered 2023-07-13: 30 mL via ORAL
  Filled 2023-07-13: qty 30

## 2023-07-13 MED ORDER — LIDOCAINE VISCOUS HCL 2 % MT SOLN
15.0000 mL | Freq: Once | OROMUCOSAL | Status: AC
  Administered 2023-07-13: 15 mL via ORAL
  Filled 2023-07-13: qty 15

## 2023-07-13 NOTE — ED Triage Notes (Signed)
 C/O indigestion onset last night.  Feeling is improved today, seen by PCP today and sent to ED for evaluation.

## 2023-07-13 NOTE — ED Provider Triage Note (Signed)
 Emergency Medicine Provider Triage Evaluation Note  Kelly Mata , a 73 y.o. female  was evaluated in triage.  Pt complains of chest pain that was severe last night.  States not having chest pain but went to her doctors and they were concerned about her EKG..  Review of Systems  Positive:  Negative:   Physical Exam  BP (!) 214/87 (BP Location: Left Arm)   Pulse (!) 106   Resp 16   Wt 70.8 kg   SpO2 100%   BMI 29.49 kg/m  Gen:   Awake, no distress   Resp:  Normal effort  MSK:   Moves extremities without difficulty  Other:    Medical Decision Making  Medically screening exam initiated at 1:38 PM.  Appropriate orders placed.  AIYSHA FREIWALD was informed that the remainder of the evaluation will be completed by another provider, this initial triage assessment does not replace that evaluation, and the importance of remaining in the ED until their evaluation is complete.  Take EKG performed, interpreted by Dr. Synetta Eves is most likely atrial flutter, no STEMI at this time.  Still do ACS workup.  Patient is to notify us  if chest pain reoccurs   Delsie Figures, PA-C 07/13/23 1339

## 2023-07-13 NOTE — ED Provider Notes (Signed)
 [facility] Provider Note    Event Date/Time   First MD Initiated Contact with Patient 07/13/23 1511     (approximate)   History   Chest Pain   HPI  Kelly Mata is a 73 y.o. female  who was advised to come to the emergency department today by PCP because of episode of chest pain that occurred last night.  The patient was started with some chest pain describes it as burning she did not have any radiation to her jaw.  She denies any shortness of breath or diaphoresis.  The pain lasted for a few hours.  She felt like it was indigestion states she has never had indigestion was before.  Had an appointment already scheduled with primary care prescribed.  While there she discussed her symptoms and had an EKG done which was concerning for T wave inversions.  At the time my exam she is still feels small chest tightness.     Physical Exam   Triage Vital Signs: ED Triage Vitals [07/13/23 1332]  Encounter Vitals Group     BP (!) 214/87     Systolic BP Percentile      Diastolic BP Percentile      Pulse Rate (!) 106     Resp 16     Temp      Temp Source Oral     SpO2 100 %     Weight 156 lb 1.4 oz (70.8 kg)     Height      Head Circumference      Peak Flow      Pain Score 0     Pain Loc      Pain Education      Exclude from Growth Chart     Most recent vital signs: Vitals:   07/13/23 1332 07/13/23 1430  BP: (!) 214/87 138/76  Pulse: (!) 106 91  Resp: 16 19  SpO2: 100% 100%   General: Awake, alert, oriented. CV:  Good peripheral perfusion. Regular rate and rhythm. Resp:  Normal effort. Lungs clear. Abd:  No distention. Non tender.   ED Results / Procedures / Treatments   Labs (all labs ordered are listed, but only abnormal results are displayed) Labs Reviewed  BASIC METABOLIC PANEL WITH GFR - Abnormal; Notable for the following components:      Result Value   CO2 20 (*)    Glucose, Bld 102 (*)    All other components within normal  limits  CBC - Abnormal; Notable for the following components:   WBC 12.8 (*)    All other components within normal limits  LIPASE, BLOOD  HEPATIC FUNCTION PANEL  TROPONIN I (HIGH SENSITIVITY)  TROPONIN I (HIGH SENSITIVITY)     EKG  I, Marylynn Soho, attending physician, personally viewed and interpreted this EKG  EKG Time: 1333 Rate: 93 Rhythm: sinus rhythm vs aflutter Axis: normal Intervals: qtc 407 QRS: narrow ST changes: no st elevation Impression: abnormal EKG. Artifact limits interpretation.    RADIOLOGY I independently interpreted and visualized the CXR. My interpretation: No pneumonia Radiology interpretation:  IMPRESSION:  Decreased depth of inspiration. No acute abnormality.      PROCEDURES:  Critical Care performed: No    MEDICATIONS ORDERED IN ED: Medications - No data to display   IMPRESSION / MDM / ASSESSMENT AND PLAN / ED COURSE  I reviewed the triage vital signs and the nursing notes.  Differential diagnosis includes, but is not limited to, ACS, pneumonia, esophagitis, costochondritis, panic attack, pancreatitis  Patient's presentation is most consistent with acute presentation with potential threat to life or bodily function.   The patient is on the cardiac monitor to evaluate for evidence of arrhythmia and/or significant heart rate changes.  Patient presented to the emergency department today concerns for chest pain.  Patient's appointment chest tightness today.  EKG appeared to be enlargement artifact.  Reviewing EKG earlier in the day it did appear sinus rhythm.  During examination of the patient it appeared to be sinus rhythm on the cardiac monitor.  Initial troponin is negative.  Will repeat.  Additionally will try GI cocktail to evaluate possible esophagitis.   2nd troponin negative. Somewhat unclear if GI cocktail helped however patient did state that her chest tightness was gone on reassessment. At this  time given reassuring work up I think it is reasonable for patient to be discharged. Have low concern for PE/dissection. Encouraged patient to follow up with PCP and to return for any new/worsening symptoms.    FINAL CLINICAL IMPRESSION(S) / ED DIAGNOSES   Final diagnoses:  Nonspecific chest pain     Note:  This document was prepared using Dragon voice recognition software and may include unintentional dictation errors.    Marylynn Soho, MD 07/13/23 [phone]

## 2023-07-13 NOTE — Assessment & Plan Note (Addendum)
 Acute CP which started last night. Epigastric and now radiating to left upper back.  Patient non toxic in appearance and in no acute respiratory distress. Blood pressure elevated. EKG with inferior t wave inversion, new when compared to prior EKG 04/17/19.  She is having CP in the office. Concern for ischemia. Advised calling EMS ; she adamantly declines and states that she will drive herself to [facility] ED for further evaluation and observation.  Triage nurse notified.  Alternatively discussed GERD, cholelithiasis.

## 2023-07-13 NOTE — Assessment & Plan Note (Addendum)
 Reviewed DEXA. Declines fosamax at this time Start calcium; continue vitamin D . Encouraged walking program Repeat dexa in 2 years.

## 2023-07-13 NOTE — Patient Instructions (Addendum)
   For post menopausal women, guidelines recommend a diet with 1200 mg of Calcium per day. If you are eating calcium rich foods, you do not need a calcium supplement. The body better absorbs the calcium that you eat over supplementation. If you do supplement, I recommend not supplementing the full 1200 mg/ day as this can lead to increased risk of cardiovascular disease. I recommend Calcium Citrate over the counter, and you may take a total of 600 to 800 mg per day in divided doses with meals for best absorption.   For bone health, you need adequate vitamin D , and I recommend you supplement as it is harder to do so with diet alone. I recommend cholecalciferol 800 units daily.  Also, please ensure you are following a diet high in calcium -- research shows better outcomes with dietary sources including kale, yogurt, broccolii, cheese, okra, almonds- to name a few.     Also remember that exercise is a great medicine for maintain and preserve bone health. Advise moderate exercise for 30 minutes , 3 times per week.      Please go directly to [facility] ED as discussed.

## 2023-07-13 NOTE — Progress Notes (Signed)
 Assessment & Plan:  Epigastric pain -     EKG 12-Lead  Osteoporosis, unspecified osteoporosis type, unspecified pathological fracture presence Assessment & Plan: Reviewed DEXA. Declines fosamax at this time Start calcium; continue vitamin D . Encouraged walking program Repeat dexa in 2 years.    Chest pain, unspecified type Assessment & Plan: Acute CP which started last night. Epigastric and now radiating to left upper back.  Patient non toxic in appearance and in no acute respiratory distress. Blood pressure elevated. EKG with inferior t wave inversion, new when compared to prior EKG 04/17/19.  She is having CP in the office. Concern for ischemia. Advised calling EMS ; she adamantly declines and states that she will drive herself to [facility] ED for further evaluation and observation.  Triage nurse notified.  Alternatively discussed GERD, cholelithiasis.       Return precautions given.   Risks, benefits, and alternatives of the medications and treatment plan prescribed today were discussed, and patient expressed understanding.   Education regarding symptom management and diagnosis given to patient on AVS either electronically or printed.  No follow-ups on file.  Bascom Bossier, FNP  Subjective:    Patient ID: Kelly Mata, female    DOB: Jul 13, 1950, 73 y.o.   MRN: [id number]  CC: Kelly Mata is a 73 y.o. female who presents today for follow up.   HPI: Complains of epigastric pain which started after dinner last night, resolved after 3 hours.  This morning she feels a 'burn'. Pain is now radiating to left upper back.    She ate a baked potatoe, ham She describes it as 'heartburn'.  Denies back pain, jaw pain, chest pressure, left arm numbness, N, vomiting, diaphoresis.   Blood pressure elevated last night due to pain 175/96 during that time. She took hydrochlorothiazide  12. 5mg  and rested , BP decreased to 135/77.   She had gingerale and epigastric pain resolved.  Today 135/80.     She has been taking vitamin d  2000. She is not taking calcium  Allergies: Amlodipine , Ceclor [cefaclor], Ampicillin, and Bactrim  [sulfamethoxazole -trimethoprim ] Current Outpatient Medications on File Prior to Visit  Medication Sig Dispense Refill   Ascorbic Acid (VITAMIN C PO) Take by mouth daily.     BIOTIN PO Take by mouth daily.     Cyanocobalamin  (VITAMIN B12 PO) Take by mouth daily.     HAWTHORN PO Take 2 capsules by mouth daily.     hydrochlorothiazide  (MICROZIDE ) 12.5 MG capsule Take 1 capsule (12.5 mg total) by mouth daily as needed. If Blood pressure > 130/80. 90 capsule 3   ibuprofen (ADVIL,MOTRIN) 200 MG tablet Take 200 mg by mouth every 6 (six) hours as needed.     Turmeric 400 MG CAPS Take 1 capsule by mouth daily.     valACYclovir  (VALTREX ) 1000 MG tablet Take two tablets ( total 2000 mg) by mouth q12h x 1 day; Start: ASAP after symptom onset 30 tablet 2   VITAMIN D  PO Take 1 capsule by mouth daily. 2000 IU     No current facility-administered medications on file prior to visit.    Review of Systems  Constitutional:  Negative for chills and fever.  Respiratory:  Negative for cough and shortness of breath.   Cardiovascular:  Positive for chest pain. Negative for palpitations and leg swelling.  Gastrointestinal:  Negative for abdominal pain, nausea and vomiting.      Objective:    BP (!) 150/78   Pulse 99   Temp 98.1 F (36.7  C) (Oral)   Ht 5\' 1"  (1.549 m)   Wt 156 lb 3.2 oz (70.9 kg)   SpO2 99%   BMI 29.51 kg/m  BP Readings from Last 3 Encounters:  07/13/23 (!) 150/78  10/10/22 132/70  07/24/22 (!) 168/80   Wt Readings from Last 3 Encounters:  07/13/23 156 lb 3.2 oz (70.9 kg)  11/10/22 157 lb (71.2 kg)  10/10/22 155 lb 1.6 oz (70.4 kg)    Physical Exam Vitals reviewed.  Constitutional:      Appearance: She is well-developed.  Eyes:     Conjunctiva/sclera: Conjunctivae normal.  Cardiovascular:     Rate and Rhythm: Normal rate and  regular rhythm.     Pulses: Normal pulses.     Heart sounds: Normal heart sounds.  Pulmonary:     Effort: Pulmonary effort is normal.     Breath sounds: Normal breath sounds. No wheezing, rhonchi or rales.  Chest:     Chest wall: No tenderness.     Comments: No reproducible chest wall pain.  No pain with deep inspiration.  Skin:    General: Skin is warm and dry.  Neurological:     Mental Status: She is alert.  Psychiatric:        Speech: Speech normal.        Behavior: Behavior normal.        Thought Content: Thought content normal.

## 2023-07-21 ENCOUNTER — Other Ambulatory Visit: Payer: Self-pay | Admitting: Family

## 2023-07-21 ENCOUNTER — Encounter: Payer: Self-pay | Admitting: Family

## 2023-07-21 DIAGNOSIS — R079 Chest pain, unspecified: Secondary | ICD-10-CM

## 2023-07-28 ENCOUNTER — Other Ambulatory Visit: Payer: Self-pay | Admitting: Internal Medicine

## 2023-07-28 DIAGNOSIS — E6609 Other obesity due to excess calories: Secondary | ICD-10-CM | POA: Diagnosis not present

## 2023-07-28 DIAGNOSIS — R0602 Shortness of breath: Secondary | ICD-10-CM | POA: Diagnosis not present

## 2023-07-28 DIAGNOSIS — R0789 Other chest pain: Secondary | ICD-10-CM

## 2023-07-28 DIAGNOSIS — R002 Palpitations: Secondary | ICD-10-CM | POA: Diagnosis not present

## 2023-07-28 DIAGNOSIS — Z853 Personal history of malignant neoplasm of breast: Secondary | ICD-10-CM | POA: Diagnosis not present

## 2023-07-28 DIAGNOSIS — I10 Essential (primary) hypertension: Secondary | ICD-10-CM | POA: Diagnosis not present

## 2023-08-03 ENCOUNTER — Telehealth: Payer: Self-pay

## 2023-08-03 ENCOUNTER — Telehealth: Admitting: Physician Assistant

## 2023-08-03 DIAGNOSIS — R1011 Right upper quadrant pain: Secondary | ICD-10-CM

## 2023-08-03 NOTE — Telephone Encounter (Addendum)
 Patient states she had a virtual visit on 08/03/23 and that provider stated they were going to secure chat with Bascom Bossier about ordering a ultrasound to rule out gallbladder issues. Patient states she is having right side pain under her right ribs that feels like someone is poking her with a stick then intermittent sharp pain in the same area. Patient denies any nausea, vomiting, diarrhea or any other symptoms. Patient states this has been going on for about 3 weeks now. Patient states she can wait on Bascom Bossier on 08/04/23.   [facility] scheduling called and I gave the office number and your cell phone number for the call report. Patient is scheduled for 4:00 today at Outpatient imaging and I said hold the patient since you ordered it Urgent. Called outpatient imaging and let them know do Not hold the Patient after ultrasound. Patient can leave after ultrasound.

## 2023-08-03 NOTE — Patient Instructions (Signed)
  Kelly Mata, thank you for joining Kelly Maillard, PA-C for today's virtual visit.  While this provider is not your primary care provider (PCP), if your PCP is located in our provider database this encounter information will be shared with them immediately following your visit.   A [facility] MyChart account gives you access to today's visit and all your visits, tests, and labs performed at [facility] " click here if you don't have a [facility] MyChart account or go to [email or web address]  Consent: (Patient) Kelly Mata provided verbal consent for this virtual visit at the beginning of the encounter.  Current Medications:  Current Outpatient Medications:    Ascorbic Acid (VITAMIN C PO), Take by mouth daily., Disp: , Rfl:    BIOTIN PO, Take by mouth daily., Disp: , Rfl:    Cyanocobalamin  (VITAMIN B12 PO), Take by mouth daily., Disp: , Rfl:    HAWTHORN PO, Take 2 capsules by mouth daily., Disp: , Rfl:    hydrochlorothiazide  (MICROZIDE ) 12.5 MG capsule, Take 1 capsule (12.5 mg total) by mouth daily as needed. If Blood pressure > 130/80., Disp: 90 capsule, Rfl: 3   ibuprofen (ADVIL,MOTRIN) 200 MG tablet, Take 200 mg by mouth every 6 (six) hours as needed., Disp: , Rfl:    Turmeric 400 MG CAPS, Take 1 capsule by mouth daily., Disp: , Rfl:    valACYclovir  (VALTREX ) 1000 MG tablet, Take two tablets ( total 2000 mg) by mouth q12h x 1 day; Start: ASAP after symptom onset, Disp: 30 tablet, Rfl: 2   VITAMIN D  PO, Take 1 capsule by mouth daily. 2000 IU, Disp: , Rfl:    Medications ordered in this encounter:  No orders of the defined types were placed in this encounter.    *If you need refills on other medications prior to your next appointment, please contact your pharmacy*  Follow-Up: Call back or seek an in-person evaluation if the symptoms worsen or if the condition fails to improve as anticipated.  [facility] [phone]  Other  Instructions ER for any acutely worsening symptoms before you hear from your PCP office.    If you have been instructed to have an in-person evaluation today at a local Urgent Care facility, please use the link below. It will take you to a list of all of our available [facility], including address, phone number and hours of operation. Please do not delay care.  [facility]  If you or a family member do not have a primary care provider, use the link below to schedule a visit and establish care. When you choose a [name] primary care physician or advanced practice provider, you gain a long-term partner in health. Find a Primary Care Provider  Learn more about [facility] in-office and virtual care options:  - Get Care Now

## 2023-08-03 NOTE — Telephone Encounter (Signed)
 Copied from CRM [id number]. Topic: Clinical - Medical Advice >> Aug 03, 2023 11:16 AM Sophia H wrote: Reason for CRM: Pt states since she was in ER she has been having gallbladder flare ups, would like to know if she needs to see FNP Bascom Bossier regarding this or if she needs to be referred out. Stated while she was in the ER they kept asking her about her gallbladder but she didn't think she had issues, since then that has changed. Please reach out to patient, states she has little pain but not too bad. Best contact # [phone]

## 2023-08-03 NOTE — Progress Notes (Signed)
 Virtual Visit Consent   Kelly Mata, you are scheduled for a virtual visit with a [facility] provider today. Just as with appointments in the office, your consent must be obtained to participate. Your consent will be active for this visit and any virtual visit you may have with one of our providers in the next 365 days. If you have a MyChart account, a copy of this consent can be sent to you electronically.  As this is a virtual visit, video technology does not allow for your provider to perform a traditional examination. This may limit your provider's ability to fully assess your condition. If your provider identifies any concerns that need to be evaluated in person or the need to arrange testing (such as labs, EKG, etc.), we will make arrangements to do so. Although advances in technology are sophisticated, we cannot ensure that it will always work on either your end or our end. If the connection with a video visit is poor, the visit may have to be switched to a telephone visit. With either a video or telephone visit, we are not always able to ensure that we have a secure connection.  By engaging in this virtual visit, you consent to the provision of healthcare and authorize for your insurance to be billed (if applicable) for the services provided during this visit. Depending on your insurance coverage, you may receive a charge related to this service.  I need to obtain your verbal consent now. Are you willing to proceed with your visit today? Kelly Mata has provided verbal consent on 08/03/2023 for a virtual visit (video or telephone). [name], [place]  Date: 08/03/2023 1:46 PM   Virtual Visit via Video Note   I, [name], connected with  Kelly Mata  ([id number], August 17, 1950) on 08/03/23 at  1:45 PM EDT by a video-enabled telemedicine application and verified that I am speaking with the correct person using two identifiers.  Location: Patient: Virtual Visit  Location Patient: Home Provider: Virtual Visit Location Provider: Home Office   I discussed the limitations of evaluation and management by telemedicine and the availability of in person appointments. The patient expressed understanding and agreed to proceed.    History of Present Illness: Kelly Mata is a 73 y.o. who identifies as a female who was assigned female at birth, and is being seen today for intermittent and dull RUQ pain over the past 3 weeks. Notes worsening of pain after heavier meals -- sharper pain. Some nausea without vomiting. As such she has been trying to eat low-fat and healthier, noting 8 pound weight loss. Is hydrating well. Denies fever, chills or bowel changes.    HPI: HPI  Problems:  Patient Active Problem List   Diagnosis Date Noted   Osteoporosis 07/13/2023   Epigastric pain 07/13/2023   Chest pain 07/13/2023   Fatigue 10/10/2022   HLD (hyperlipidemia) 01/05/2021   Thyroid  nodule 10/20/2020   Palpitations 10/05/2020   Grief reaction 09/08/2020   Arthralgia 10/07/2019   Arthritis 07/23/2019   Genital herpes 07/23/2019   Migraine 07/23/2019   Leg pain, bilateral 07/17/2019   HTN (hypertension) 04/26/2019   Liver lesion 04/26/2019   Dysuria 04/04/2018   Diarrhea 02/23/2018   Atherosclerosis of aorta (HCC) 08/21/2017   Nonintractable headache 08/16/2017   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   Skull lesion 06/22/2017   Intractable headache 06/19/2017   Left knee pain 01/02/2017  Tremor 06/23/2016    Allergies:  Allergies  Allergen Reactions   Amlodipine      Arm and leg pain   Ceclor [Cefaclor] Hives   Ampicillin Rash   Bactrim  [Sulfamethoxazole -Trimethoprim ] Rash   Medications:  Current Outpatient Medications:    Ascorbic Acid (VITAMIN C PO), Take by mouth daily., Disp: , Rfl:    BIOTIN PO, Take by mouth daily., Disp: , Rfl:    Cyanocobalamin  (VITAMIN B12 PO), Take by mouth  daily., Disp: , Rfl:    HAWTHORN PO, Take 2 capsules by mouth daily., Disp: , Rfl:    hydrochlorothiazide  (MICROZIDE ) 12.5 MG capsule, Take 1 capsule (12.5 mg total) by mouth daily as needed. If Blood pressure > 130/80., Disp: 90 capsule, Rfl: 3   ibuprofen (ADVIL,MOTRIN) 200 MG tablet, Take 200 mg by mouth every 6 (six) hours as needed., Disp: , Rfl:    Turmeric 400 MG CAPS, Take 1 capsule by mouth daily., Disp: , Rfl:    valACYclovir  (VALTREX ) 1000 MG tablet, Take two tablets ( total 2000 mg) by mouth q12h x 1 day; Start: ASAP after symptom onset, Disp: 30 tablet, Rfl: 2   VITAMIN D  PO, Take 1 capsule by mouth daily. 2000 IU, Disp: , Rfl:   Observations/Objective: Patient is well-developed, well-nourished in no acute distress.  Resting comfortably at home.  Head is normocephalic, atraumatic.  No labored breathing.  Speech is clear and coherent with logical content.  Patient is alert and oriented at baseline.   Assessment and Plan: 1. RUQ pain (Primary)  Colicky. Worse after meals. Asymptomatic at present. Needs further workup with exam and US . Will send message directly to her PCP whom she has been trying to get in contact with. ER precautions reviewed.   Follow Up Instructions: I discussed the assessment and treatment plan with the patient. The patient was provided an opportunity to ask questions and all were answered. The patient agreed with the plan and demonstrated an understanding of the instructions.  A copy of instructions were sent to the patient via MyChart unless otherwise noted below.   The patient was advised to call back or seek an in-person evaluation if the symptoms worsen or if the condition fails to improve as anticipated.    [name], PA-C

## 2023-08-04 ENCOUNTER — Encounter: Payer: Self-pay | Admitting: Family

## 2023-08-04 ENCOUNTER — Ambulatory Visit: Payer: Self-pay | Admitting: Family

## 2023-08-04 ENCOUNTER — Ambulatory Visit
Admission: RE | Admit: 2023-08-04 | Discharge: 2023-08-04 | Disposition: A | Discharge status: Home / Self Care | Source: Ambulatory Visit | Attending: Family | Admitting: Family

## 2023-08-04 DIAGNOSIS — R1011 Right upper quadrant pain: Secondary | ICD-10-CM | POA: Insufficient documentation

## 2023-08-04 NOTE — Telephone Encounter (Signed)
 Referral team We need stat US  RUQ today? Can you we schedule?

## 2023-08-04 NOTE — Addendum Note (Signed)
 Addended by: Calista Catching on: 08/04/2023 06:40 AM   Modules accepted: Orders

## 2023-08-09 ENCOUNTER — Ambulatory Visit (INDEPENDENT_AMBULATORY_CARE_PROVIDER_SITE_OTHER)

## 2023-08-09 ENCOUNTER — Ambulatory Visit: Payer: Self-pay

## 2023-08-09 ENCOUNTER — Other Ambulatory Visit
Admission: RE | Admit: 2023-08-09 | Discharge: 2023-08-09 | Disposition: A | Discharge status: Home / Self Care | Source: Ambulatory Visit

## 2023-08-09 VITALS — BP 136/94 | HR 46 | Ht 61.0 in | Wt 152.8 lb

## 2023-08-09 DIAGNOSIS — R1011 Right upper quadrant pain: Secondary | ICD-10-CM

## 2023-08-09 LAB — URINALYSIS, ROUTINE W REFLEX MICROSCOPIC
Bilirubin Urine: NEGATIVE
Glucose, UA: NEGATIVE mg/dL
Ketones, ur: 20 mg/dL — AB
Nitrite: NEGATIVE
Protein, ur: NEGATIVE mg/dL
Specific Gravity, Urine: 1.016 (ref 1.005–1.030)
pH: 5 (ref 5.0–8.0)

## 2023-08-09 LAB — COMPREHENSIVE METABOLIC PANEL WITH GFR
ALT: 17 U/L (ref 0–44)
AST: 19 U/L (ref 15–41)
Albumin: 4.4 g/dL (ref 3.5–5.0)
Alkaline Phosphatase: 53 U/L (ref 38–126)
Anion gap: 8 (ref 5–15)
BUN: 13 mg/dL (ref 8–23)
CO2: 25 mmol/L (ref 22–32)
Calcium: 9.8 mg/dL (ref 8.9–10.3)
Chloride: 107 mmol/L (ref 98–111)
Creatinine, Ser: 1 mg/dL (ref 0.44–1.00)
GFR, Estimated: 59 mL/min — ABNORMAL LOW (ref >60)
Glucose, Bld: 99 mg/dL (ref 70–99)
Potassium: 3.8 mmol/L (ref 3.5–5.1)
Sodium: 140 mmol/L (ref 135–145)
Total Bilirubin: 0.7 mg/dL (ref 0.0–1.2)
Total Protein: 7.1 g/dL (ref 6.5–8.1)

## 2023-08-09 LAB — CBC WITH DIFFERENTIAL/PLATELET
Abs Immature Granulocytes: 0.03 10*3/uL (ref 0.00–0.07)
Basophils Absolute: 0 10*3/uL (ref 0.0–0.1)
Basophils Relative: 0 %
Eosinophils Absolute: 0 10*3/uL (ref 0.0–0.5)
Eosinophils Relative: 0 %
HCT: 40 % (ref 36.0–46.0)
Hemoglobin: 13 g/dL (ref 12.0–15.0)
Immature Granulocytes: 0 %
Lymphocytes Relative: 19 %
Lymphs Abs: 1.8 10*3/uL (ref 0.7–4.0)
MCH: 30.1 pg (ref 26.0–34.0)
MCHC: 32.5 g/dL (ref 30.0–36.0)
MCV: 92.6 fL (ref 80.0–100.0)
Monocytes Absolute: 0.6 10*3/uL (ref 0.1–1.0)
Monocytes Relative: 6 %
Neutro Abs: 7.3 10*3/uL (ref 1.7–7.7)
Neutrophils Relative %: 75 %
Platelets: 245 10*3/uL (ref 150–400)
RBC: 4.32 MIL/uL (ref 3.87–5.11)
RDW: 12.7 % (ref 11.5–15.5)
WBC: 9.8 10*3/uL (ref 4.0–10.5)
nRBC: 0 % (ref 0.0–0.2)

## 2023-08-09 LAB — LIPASE, BLOOD: Lipase: 29 U/L (ref 11–51)

## 2023-08-09 MED ORDER — PANTOPRAZOLE SODIUM 40 MG PO TBEC: 40.0000 mg | DELAYED_RELEASE_TABLET | Freq: Two times a day (BID) | ORAL | 0 refills | Status: DC

## 2023-08-09 NOTE — Patient Instructions (Addendum)
--   We are updating your CBC to see if you have infection.  -- I have ordered urgent CT abdomen and pelvis. We will update you on results.  -- Start taking Protonix 40 mg, twice a day, please take this 30 min before eating food for better absorption.  -- Bland diet for the next week. Please refrain from drinking alcohol, coffee, and taking supplements.  -- If you develop black tarry stool, vomiting, worsening abdominal pain, chest pain, fever, chills your should be seen in the ER.  -- F/U with Daivd Dub within 1 week.

## 2023-08-09 NOTE — Telephone Encounter (Signed)
 Pt scheduled to see Dr. Casimir Cleaver today at 4pm

## 2023-08-09 NOTE — Progress Notes (Signed)
 Patient had UA done on 08/09/23 through [facility]. Please reach out to lab to add on urine culture to the sample from 08/09/23.   Thank you,  Jacklin Mascot, MD

## 2023-08-09 NOTE — Progress Notes (Signed)
 Acute Office Visit  Subjective:    Patient ID: Kelly Mata, female    DOB: 1950/07/11, 73 y.o.   MRN: [id number]  Chief Complaint  Patient presents with   Abdominal Pain   Patient is in today for evaluation of right sided abdominal pain intermittent for about 1 month.   HPI RUQ pain: intermittent in nature for about 1 month. Was pain free for the last 3 days before pain started on 08/08/23. She describes the pain to be burning in nature. A/W nausea, reduced appetite for 2 days, she is afraid to eat as she is afraid eating can make the pain worse. Feeling faint as she has not eaten regular meals for about 2 days. She drinks 2 cups of coffee daily. Usually on Fridays does to wine tasting.   Dark colored stool this morning x 1 episode. Reports since then she has normal color stool. Bloating for about 1 week.   Patient has not taken any Ibuprofen, advil this week. No vomiting. Fever, chills. No chest pain, palpitations. Does not take hydrochlorothiazide  unless Bp >130/80 mmHg.   WBC elevated on 07/13/23. 08/04/23: RUQ usg: no gallstones.   ROS As per HPI    Objective:    BP (!) 136/94   Pulse (!) 46   Ht 5\' 1"  (1.549 m)   Wt 152 lb 12.8 oz (69.3 kg)   SpO2 96%   BMI 28.87 kg/m    Physical Exam Constitutional:      Appearance: She is not toxic-appearing.  HENT:     Mouth/Throat:     Mouth: Mucous membranes are moist.  Cardiovascular:     Rate and Rhythm: Normal rate.  Pulmonary:     Breath sounds: No rales.  Abdominal:     General: Bowel sounds are increased.     Palpations: Abdomen is soft. There is no hepatomegaly or splenomegaly.     Tenderness: There is abdominal tenderness (right upper and lower quadrant without guarding). There is no right CVA tenderness, left CVA tenderness, guarding or rebound. Negative signs include Murphy's sign and Rovsing's sign.     Hernia: There is no hernia in the umbilical area.  Skin:    General: Skin is warm.  Neurological:      Mental Status: She is alert and oriented to person, place, and time.     No results found for any visits on 08/09/23.     Assessment & Plan:  Right upper quadrant abdominal pain Assessment & Plan: D/D includes cholecystitis, chololithiasis, pancreatitis, PUD, renal pathology.  She was found to have elevated WBC during her on 07/13/23.  Recommend start Protonix  40 mg, twice a day. Take it 30 min before eating food. Obtain CT abdomen/pelvis urgently.  Lipase to r/o pancreatitis. UA, CMP to check for hepatic/renal function. CBC to check for Hb, WBC follow up from 5/1.  Discussed red flag symptoms including fever, chills, vomiting, worsening abdominal pain, blood in stool/melena, chest pain to be seen in ED.  Bland diet, refrain from alcohol, OTC supplements, coffee for next 7-14 days.  May need EGD, colonoscopy,GI referral if pain continues and imaging and lab findings reassuring.    Orders: -     CT ABDOMEN PELVIS W CONTRAST; Future -     Pantoprazole  Sodium; Take 1 tablet (40 mg total) by mouth 2 (two) times daily for 14 days.  Dispense: 30 tablet; Refill: 0 -     Lipase -     CBC with Differential/Platelet; Future -  Comprehensive metabolic panel with GFR; Future -     Urinalysis, Routine w reflex microscopic; Future -     Lipase, blood; Future    Return in about 1 week (around 08/16/2023) for With PCP for f/u on abdominal pain .  Jacklin Mascot, MD

## 2023-08-09 NOTE — Assessment & Plan Note (Signed)
 D/D includes cholecystitis, chololithiasis, pancreatitis, PUD, renal pathology.  She was found to have elevated WBC during her on 07/13/23.  Recommend start Protonix 40 mg, twice a day. Take it 30 min before eating food. Obtain CT abdomen/pelvis urgently.  Lipase to r/o pancreatitis. UA, CMP to check for hepatic/renal function. CBC to check for Hb, WBC follow up from 5/1.  Discussed red flag symptoms including fever, chills, vomiting, worsening abdominal pain, blood in stool/melena, chest pain to be seen in ED.  Bland diet, refrain from alcohol, OTC supplements, coffee for next 7-14 days.  May need EGD, colonoscopy,GI referral if pain continues and imaging and lab findings reassuring.

## 2023-08-10 ENCOUNTER — Other Ambulatory Visit
Admission: RE | Admit: 2023-08-10 | Discharge: 2023-08-10 | Disposition: A | Discharge status: Home / Self Care | Source: Ambulatory Visit

## 2023-08-10 ENCOUNTER — Ambulatory Visit
Admission: RE | Admit: 2023-08-10 | Discharge: 2023-08-10 | Disposition: A | Discharge status: Home / Self Care | Source: Ambulatory Visit

## 2023-08-10 DIAGNOSIS — R1011 Right upper quadrant pain: Secondary | ICD-10-CM | POA: Diagnosis not present

## 2023-08-10 DIAGNOSIS — R1031 Right lower quadrant pain: Secondary | ICD-10-CM | POA: Diagnosis not present

## 2023-08-10 DIAGNOSIS — K5730 Diverticulosis of large intestine without perforation or abscess without bleeding: Secondary | ICD-10-CM | POA: Diagnosis not present

## 2023-08-10 DIAGNOSIS — D1771 Benign lipomatous neoplasm of kidney: Secondary | ICD-10-CM | POA: Diagnosis not present

## 2023-08-10 MED ORDER — IOHEXOL 300 MG/ML  SOLN
100.0000 mL | Freq: Once | INTRAMUSCULAR | Status: AC | PRN
  Administered 2023-08-10: 100 mL via INTRAVENOUS

## 2023-08-11 ENCOUNTER — Ambulatory Visit: Payer: Self-pay

## 2023-08-11 DIAGNOSIS — R1011 Right upper quadrant pain: Secondary | ICD-10-CM

## 2023-08-11 DIAGNOSIS — R8271 Bacteriuria: Secondary | ICD-10-CM | POA: Insufficient documentation

## 2023-08-11 LAB — URINE CULTURE

## 2023-08-11 NOTE — Progress Notes (Signed)
 Please let the patient know the recent urine culture results indicate the presence of multiple species, which suggests the possibility of a contaminated urine sample. To accurately determine whether there is a true urinary tract infection (UTI).  Please contact the patient to arrange for a recollection of the urine sample and have patient counseled by lab personal on clean catch sample before she gives urine sample.   Thank you for your prompt attention to this matter I have already signed UA and culture.  Jacklin Mascot, MD

## 2023-08-12 NOTE — Progress Notes (Signed)
 Noted.  Jacklin Mascot, MD

## 2023-08-21 DIAGNOSIS — R0602 Shortness of breath: Secondary | ICD-10-CM | POA: Diagnosis not present

## 2023-08-21 DIAGNOSIS — R0789 Other chest pain: Secondary | ICD-10-CM | POA: Diagnosis not present

## 2023-08-24 ENCOUNTER — Encounter: Payer: Self-pay | Admitting: Family

## 2023-08-24 ENCOUNTER — Encounter (HOSPITAL_COMMUNITY): Payer: Self-pay

## 2023-08-24 ENCOUNTER — Ambulatory Visit: Admitting: Family

## 2023-08-24 VITALS — BP 118/71 | HR 64 | Temp 97.9°F | Ht 61.0 in | Wt 149.4 lb

## 2023-08-24 DIAGNOSIS — D1771 Benign lipomatous neoplasm of kidney: Secondary | ICD-10-CM

## 2023-08-24 DIAGNOSIS — R8271 Bacteriuria: Secondary | ICD-10-CM

## 2023-08-24 DIAGNOSIS — R1011 Right upper quadrant pain: Secondary | ICD-10-CM | POA: Diagnosis not present

## 2023-08-24 LAB — URINALYSIS, ROUTINE W REFLEX MICROSCOPIC
Bilirubin Urine: NEGATIVE
Hgb urine dipstick: NEGATIVE
Ketones, ur: NEGATIVE
Nitrite: NEGATIVE
RBC / HPF: NONE SEEN (ref 0–?)
Specific Gravity, Urine: 1.01 (ref 1.000–1.030)
Total Protein, Urine: NEGATIVE
Urine Glucose: NEGATIVE
Urobilinogen, UA: 0.2 (ref 0.0–1.0)
pH: 7 (ref 5.0–8.0)

## 2023-08-24 NOTE — Assessment & Plan Note (Signed)
 Reviewed CT a/p with attention to 1 cm benign left renal angiomyolipoma . Size is unchanged from prior since 2020 , for 5 years.

## 2023-08-24 NOTE — Assessment & Plan Note (Signed)
 Reviewed CT abdomen pelvis , labs obtained by Dr Casimir Cleaver.  Fortunately patient's pain has resolved at this time.  She is no longer on Protonix .  She politely declines any further evaluation at this time.  Discussed referral to gastroenterology for EGD, colonoscopy if pain were to recur.  She will stay hypervigilant

## 2023-08-24 NOTE — Progress Notes (Signed)
 Assessment & Plan:  Angiomyolipoma of kidney Assessment & Plan: Reviewed CT a/p with attention to 1 cm benign left renal angiomyolipoma . Size is unchanged from prior since 2020 , for 5 years.    Bacteriuria -     Urine Culture -     Urinalysis, Routine w reflex microscopic  RUQ pain Assessment & Plan: Reviewed CT abdomen pelvis , labs obtained by Kelly Mata.  Fortunately patient's pain has resolved at this time.  She is no longer on Protonix .  She politely declines any further evaluation at this time.  Discussed referral to gastroenterology for EGD, colonoscopy if pain were to recur.  She will stay hypervigilant  Orders: -     Urine Culture -     Urinalysis, Routine w reflex microscopic     Return precautions given.   Risks, benefits, and alternatives of the medications and treatment plan prescribed today were discussed, and patient expressed understanding.   Education regarding symptom management and diagnosis given to patient on AVS either electronically or printed.  No follow-ups on file.  Kelly Bossier, FNP  Subjective:    Patient ID: Kelly Mata, female    DOB: 04-Aug-1950, 73 y.o.   MRN: [id number]  CC: Kelly Mata is a 73 y.o. female who presents today for follow up.   HPI: Follow-up from 08/09/2023 visit.   RUQ abdominal pain is 'pretty much gone. ' No pain today.   She has been somewhat 'scared to eat' this last month and attributes this to weight loss.    She has lost 10 lbs in 4 weeks and pleased to have lost weight. She would like to maintain or loose a few more pounds.   She followed bland diet and since returned to closer to normal eating pattern. She is eating smaller portions and following mediterranean diet.   Completed Protonix  40 mg twice daily 3 days, then stopped .  Denies fever, chills, dysuria, constipation.   BP 118/71 at home.     urine culture multiple present  08/09/23 Urinalysis rare bacteria, small leukocyte, ketones WBC  9.8 Lipase 29 CT abdomen pelvis 08/10/2023 no suspicious hepatic masses identified.  Focal fatty infiltration.  Gallbladder is unremarkable.  Normal adrenal glands.  Benign angiomyolipoma left kidney.  No evidence of obstruction, inflammatory process.  Normal appendix.  Prior hysterectomy.  Adnexal regions are unremarkable.  Mild sigmoid diverticulosis without radiographic evidence of diverticulitis.  Scheduled CTA with Kelly Mata 08/28/23 Colonoscopy reported by  Kelly Mata 03/2014 Allergies: Amlodipine , Ceclor [cefaclor], Ampicillin, and Bactrim  [sulfamethoxazole -trimethoprim ] Current Outpatient Medications on File Prior to Visit  Medication Sig Dispense Refill   Ascorbic Acid (VITAMIN C PO) Take by mouth daily.     BIOTIN PO Take by mouth daily.     Cyanocobalamin  (VITAMIN B12 PO) Take by mouth daily.     HAWTHORN PO Take 2 capsules by mouth daily.     hydrochlorothiazide  (MICROZIDE ) 12.5 MG capsule Take 1 capsule (12.5 mg total) by mouth daily as needed. If Blood pressure > 130/80. 90 capsule 3   ibuprofen (ADVIL,MOTRIN) 200 MG tablet Take 200 mg by mouth every 6 (six) hours as needed.     Turmeric 400 MG CAPS Take 1 capsule by mouth daily.     valACYclovir  (VALTREX ) 1000 MG tablet Take two tablets ( total 2000 mg) by mouth q12h x 1 day; Start: ASAP after symptom onset 30 tablet 2   VITAMIN D  PO Take 1 capsule by mouth daily. 2000 IU  No current facility-administered medications on file prior to visit.    Review of Systems  Constitutional:  Negative for chills and fever.  Respiratory:  Negative for cough and shortness of breath.   Cardiovascular:  Negative for chest pain and palpitations.  Gastrointestinal:  Negative for abdominal distention, abdominal pain, constipation, diarrhea, nausea and vomiting.  Genitourinary:  Negative for dysuria.      Objective:    BP 118/71   Pulse 64   Temp 97.9 F (36.6 C) (Oral)   Ht 5' 1 (1.549 m)   Wt 149 lb 6.4 oz (67.8 kg)   SpO2 99%    BMI 28.23 kg/m  BP Readings from Last 3 Encounters:  08/24/23 118/71  08/09/23 (!) 136/94  07/13/23 (!) 155/66   Wt Readings from Last 3 Encounters:  08/24/23 149 lb 6.4 oz (67.8 kg)  08/09/23 152 lb 12.8 oz (69.3 kg)  07/13/23 156 lb 1.4 oz (70.8 kg)    Physical Exam Vitals reviewed.  Constitutional:      Appearance: Normal appearance. She is well-developed.   Eyes:     Conjunctiva/sclera: Conjunctivae normal.    Cardiovascular:     Rate and Rhythm: Normal rate and regular rhythm.     Pulses: Normal pulses.     Heart sounds: Normal heart sounds.  Pulmonary:     Effort: Pulmonary effort is normal.     Breath sounds: Normal breath sounds. No wheezing, rhonchi or rales.  Abdominal:     General: Bowel sounds are normal. There is no distension.     Palpations: Abdomen is soft. Abdomen is not rigid. There is no fluid wave or mass.     Tenderness: There is no abdominal tenderness. There is no guarding or rebound.   Skin:    General: Skin is warm and dry.   Neurological:     Mental Status: She is alert.   Psychiatric:        Speech: Speech normal.        Behavior: Behavior normal.        Thought Content: Thought content normal.

## 2023-08-25 ENCOUNTER — Ambulatory Visit: Payer: Self-pay

## 2023-08-25 ENCOUNTER — Emergency Department
Admission: EM | Admit: 2023-08-25 | Discharge: 2023-08-25 | Disposition: A | Discharge status: Home / Self Care | Attending: Emergency Medicine | Admitting: Emergency Medicine

## 2023-08-25 ENCOUNTER — Other Ambulatory Visit: Payer: Self-pay

## 2023-08-25 DIAGNOSIS — R0789 Other chest pain: Secondary | ICD-10-CM | POA: Insufficient documentation

## 2023-08-25 LAB — CBC WITH DIFFERENTIAL/PLATELET
Abs Immature Granulocytes: 0.03 10*3/uL (ref 0.00–0.07)
Basophils Absolute: 0.1 10*3/uL (ref 0.0–0.1)
Basophils Relative: 1 %
Eosinophils Absolute: 0.1 10*3/uL (ref 0.0–0.5)
Eosinophils Relative: 2 %
HCT: 38.4 % (ref 36.0–46.0)
Hemoglobin: 12.6 g/dL (ref 12.0–15.0)
Immature Granulocytes: 1 %
Lymphocytes Relative: 23 %
Lymphs Abs: 1.5 10*3/uL (ref 0.7–4.0)
MCH: 29.9 pg (ref 26.0–34.0)
MCHC: 32.8 g/dL (ref 30.0–36.0)
MCV: 91.2 fL (ref 80.0–100.0)
Monocytes Absolute: 0.5 10*3/uL (ref 0.1–1.0)
Monocytes Relative: 8 %
Neutro Abs: 4.2 10*3/uL (ref 1.7–7.7)
Neutrophils Relative %: 65 %
Platelets: 249 10*3/uL (ref 150–400)
RBC: 4.21 MIL/uL (ref 3.87–5.11)
RDW: 12.9 % (ref 11.5–15.5)
WBC: 6.4 10*3/uL (ref 4.0–10.5)
nRBC: 0 % (ref 0.0–0.2)

## 2023-08-25 LAB — COMPREHENSIVE METABOLIC PANEL WITH GFR
ALT: 18 U/L (ref 0–44)
AST: 21 U/L (ref 15–41)
Albumin: 4.1 g/dL (ref 3.5–5.0)
Alkaline Phosphatase: 55 U/L (ref 38–126)
Anion gap: 8 (ref 5–15)
BUN: 17 mg/dL (ref 8–23)
CO2: 24 mmol/L (ref 22–32)
Calcium: 9.8 mg/dL (ref 8.9–10.3)
Chloride: 106 mmol/L (ref 98–111)
Creatinine, Ser: 0.86 mg/dL (ref 0.44–1.00)
GFR, Estimated: >60 mL/min (ref >60)
Glucose, Bld: 111 mg/dL — ABNORMAL HIGH (ref 70–99)
Potassium: 3.7 mmol/L (ref 3.5–5.1)
Sodium: 138 mmol/L (ref 135–145)
Total Bilirubin: 0.9 mg/dL (ref 0.0–1.2)
Total Protein: 6.9 g/dL (ref 6.5–8.1)

## 2023-08-25 LAB — URINE CULTURE
MICRO NUMBER:: 16572898
SPECIMEN QUALITY:: ADEQUATE

## 2023-08-25 LAB — TROPONIN I (HIGH SENSITIVITY): Troponin I (High Sensitivity): 3 ng/L (ref <18)

## 2023-08-25 LAB — LIPASE, BLOOD: Lipase: 30 U/L (ref 11–51)

## 2023-08-25 NOTE — ED Provider Notes (Signed)
 [facility] Provider Note    Event Date/Time   First MD Initiated Contact with Patient 08/25/23 [phone]     (approximate)   History   Back Pain   HPI Kelly Mata is a 73 y.o. female who presents for evaluation of pain in her back as well as in her left shoulder and some in her jaw.  She said that this has happened before, as recently as about a month and a half ago.  She came to the emergency department and had a reassuring workup including 2 negative troponins.  In the meantime, she has follow-up with her primary care provider and has had additional outpatient testing.  She also has plans for a cardiac CT scan in about 3 days and an outpatient stress test with her cardiologist (Dr. Beau Bound).  She thinks that stress is likely contributing to the issue.  She said that she woke up with some pain in her right shoulder blade.  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  After the pain in her shoulder woke her up, she tried to read a little bit to see if she can go back to sleep (this was approximately 1:30 AM), but then she started to think more and more about it and developed more symptoms and she thinks that she may have had a slight panic attack.  She felt a little bit nauseated earlier as well.  She is here she is feeling better.  She still feels a little bit of what she describes as a burning pain in her left shoulder blade but she said that she has felt that for years because she holds all of my tension in my left shoulder.       Physical Exam   Triage Vital Signs: ED Triage Vitals  Encounter Vitals Group     BP 08/25/23 0440 (!) 166/95     Girls Systolic BP Percentile --      Girls Diastolic BP Percentile --      Boys Systolic BP Percentile --      Boys Diastolic BP  Percentile --      Pulse Rate 08/25/23 0440 82     Resp 08/25/23 0440 18     Temp 08/25/23 0440 98.1 F (36.7 C)     Temp src --      SpO2 08/25/23 0440 100 %     Weight 08/25/23 0439 68 kg (150 lb)     Height 08/25/23 0439 1.549 m (5' 1)     Head Circumference --      Peak Flow --      Pain Score 08/25/23 0439 7     Pain Loc --      Pain Education --      Exclude from Growth Chart --     Most recent vital signs: Vitals:   08/25/23 0440  BP: (!) 166/95  Pulse: 82  Resp: 18  Temp: 98.1 F (36.7 C)  SpO2: 100%    General: Awake, alert, no obvious distress, anxious but acting appropriate. CV:  Good peripheral perfusion.  Regular rate and rhythm, normal heart sounds. Resp:  Normal effort. Speaking easily and comfortably, no accessory muscle usage nor intercostal retractions.  Lungs clear to auscultation bilaterally. Abd:  No distention.  No tenderness to palpation of the abdomen including in the epigastrium and right upper quadrant with negative Murphy sign.   ED Results / Procedures / Treatments   Labs (all labs ordered are listed, but only abnormal results are displayed) Labs Reviewed  COMPREHENSIVE METABOLIC PANEL WITH GFR - Abnormal; Notable for the following components:      Result Value   Glucose, Bld 111 (*)    All other components within normal limits  CBC WITH DIFFERENTIAL/PLATELET  LIPASE, BLOOD  TROPONIN I (HIGH SENSITIVITY)     EKG  ED ECG REPORT I, Lynnda Sas, the attending physician, personally viewed and interpreted this ECG.  Date: 08/25/2023 EKG Time: 4:42 AM Rate: 90 Rhythm: normal sinus rhythm QRS Axis: normal Intervals: normal ST/T Wave abnormalities: normal Narrative Interpretation: no evidence of acute ischemia    PROCEDURES:  Critical Care performed: No  .1-3 Lead EKG Interpretation  Performed by: Lynnda Sas, MD Authorized by: Lynnda Sas, MD     Interpretation: normal     ECG rate:  90   ECG rate assessment: normal      Rhythm: sinus rhythm     Ectopy: none     Conduction: normal       IMPRESSION / MDM / ASSESSMENT AND PLAN / ED COURSE  I reviewed the triage vital signs and the nursing notes.                              Differential diagnosis includes, but is not limited to, anxiety, ACS, biliary colic, musculoskeletal strain, PE.  Patient's presentation is most consistent with acute presentation with potential threat to life or bodily function.  Labs/studies ordered: EKG, lipase, CMP, high-sensitivity troponin, CBC with differential  Interventions/Medications given:  Medications - No data to display  (Note:  hospital course my include additional interventions and/or labs/studies not listed above.)  Vital signs normal other than some hypertension for which she took an amlodipine  at home.  This is likely situational.  No ischemia on EKG.  The patient has had extensive workup both in the ED and as an outpatient with plans for a cardiac CT in a few days.  I considered a CTA chest rule out PE but her symptoms are not consistent with pulmonary embolism and she has no risk factors with a Wells score of 0.  Vital signs are stable and within normal limits except for the hypertension as previously described.  Patient has had symptoms for greater than 4 hours.  The current plan is to check labs including high-sensitivity troponin and reassess.  She is feeling better at this time and declining any medication.  Most likely patient will be appropriate for discharge and outpatient follow-up.  The patient is on the cardiac monitor to evaluate for evidence of arrhythmia and/or significant heart rate changes.   Clinical Course as of 08/25/23 0646  Fri Aug 25, 2023  0642 Labs all came back normal.  The patient is feeling well and is in no distress.  We had another talk and I again considered hospitalization or at least additional observation, but the patient is comfortable with the plan for discharge.  She is  low risk for ACS and I think outpatient follow-up is very reasonable and appropriate, particularly given the close follow-up she already has plan.  I gave strict return precautions and she agrees [CF]    Clinical Course User Index [CF] Lynnda Sas, MD  FINAL CLINICAL IMPRESSION(S) / ED DIAGNOSES   Final diagnoses:  Atypical chest pain     Rx / DC Orders   ED Discharge Orders     None        Note:  This document was prepared using Dragon voice recognition software and may include unintentional dictation errors.   Lynnda Sas, MD 08/25/23 [phone]

## 2023-08-25 NOTE — Discharge Instructions (Signed)

## 2023-08-25 NOTE — ED Triage Notes (Signed)
 Pt reports upper back pain and left arm numbness that began tonight and then pt became nauseated. Pt reports she checked her BP at home and it was elevated so she took a prescribed amlodipine .

## 2023-08-25 NOTE — Progress Notes (Signed)
 Kelly Mata! Kelly Mascot, MD

## 2023-08-28 ENCOUNTER — Ambulatory Visit
Admission: RE | Admit: 2023-08-28 | Discharge: 2023-08-28 | Disposition: A | Discharge status: Home / Self Care | Source: Ambulatory Visit | Attending: Internal Medicine | Admitting: Internal Medicine

## 2023-08-28 DIAGNOSIS — R0789 Other chest pain: Secondary | ICD-10-CM | POA: Insufficient documentation

## 2023-08-28 DIAGNOSIS — I209 Angina pectoris, unspecified: Secondary | ICD-10-CM | POA: Insufficient documentation

## 2023-08-28 DIAGNOSIS — R0602 Shortness of breath: Secondary | ICD-10-CM | POA: Insufficient documentation

## 2023-08-28 DIAGNOSIS — R911 Solitary pulmonary nodule: Secondary | ICD-10-CM | POA: Diagnosis not present

## 2023-08-28 MED ORDER — METOPROLOL TARTRATE 5 MG/5ML IV SOLN
10.0000 mg | INTRAVENOUS | Status: DC | PRN
  Filled 2023-08-28: qty 10

## 2023-08-28 MED ORDER — NITROGLYCERIN 0.4 MG SL SUBL
0.8000 mg | SUBLINGUAL_TABLET | Freq: Once | SUBLINGUAL | Status: AC
  Administered 2023-08-28: 0.8 mg via SUBLINGUAL
  Filled 2023-08-28: qty 25

## 2023-08-28 MED ORDER — DILTIAZEM HCL 25 MG/5ML IV SOLN
10.0000 mg | INTRAVENOUS | Status: DC | PRN
  Filled 2023-08-28: qty 5

## 2023-08-28 MED ORDER — IOHEXOL 350 MG/ML SOLN
80.0000 mL | Freq: Once | INTRAVENOUS | Status: AC | PRN
  Administered 2023-08-28: 80 mL via INTRAVENOUS

## 2023-08-28 MED ORDER — METOPROLOL TARTRATE 5 MG/5ML IV SOLN
INTRAVENOUS | Status: AC
  Filled 2023-08-28: qty 10

## 2023-08-28 NOTE — Progress Notes (Signed)
 Patient tolerated procedure well. Ambulate w/o difficulty. Denies any lightheadedness or being dizzy. Pt denies any pain at this time. Sitting in chair. Pt is encouraged to drink additional water throughout the day and reason explained to patient. Patient verbalized understanding and all questions answered. ABC intact. No further needs at this time. Discharge from procedure area w/o issues.

## 2023-09-04 ENCOUNTER — Other Ambulatory Visit: Payer: Self-pay

## 2023-09-04 ENCOUNTER — Emergency Department

## 2023-09-04 ENCOUNTER — Emergency Department
Admission: EM | Admit: 2023-09-04 | Discharge: 2023-09-04 | Disposition: A | Discharge status: Home / Self Care | Attending: Emergency Medicine | Admitting: Emergency Medicine

## 2023-09-04 DIAGNOSIS — I10 Essential (primary) hypertension: Secondary | ICD-10-CM | POA: Diagnosis not present

## 2023-09-04 DIAGNOSIS — R079 Chest pain, unspecified: Secondary | ICD-10-CM | POA: Diagnosis not present

## 2023-09-04 DIAGNOSIS — R1013 Epigastric pain: Secondary | ICD-10-CM | POA: Diagnosis not present

## 2023-09-04 DIAGNOSIS — M549 Dorsalgia, unspecified: Secondary | ICD-10-CM | POA: Diagnosis not present

## 2023-09-04 DIAGNOSIS — R0789 Other chest pain: Secondary | ICD-10-CM | POA: Diagnosis not present

## 2023-09-04 DIAGNOSIS — R0602 Shortness of breath: Secondary | ICD-10-CM | POA: Diagnosis not present

## 2023-09-04 LAB — BASIC METABOLIC PANEL WITH GFR
Anion gap: 7 (ref 5–15)
BUN: 17 mg/dL (ref 8–23)
CO2: 24 mmol/L (ref 22–32)
Calcium: 9.7 mg/dL (ref 8.9–10.3)
Chloride: 107 mmol/L (ref 98–111)
Creatinine, Ser: 0.78 mg/dL (ref 0.44–1.00)
GFR, Estimated: >60 mL/min (ref >60)
Glucose, Bld: 100 mg/dL — ABNORMAL HIGH (ref 70–99)
Potassium: 3.5 mmol/L (ref 3.5–5.1)
Sodium: 138 mmol/L (ref 135–145)

## 2023-09-04 LAB — HEPATIC FUNCTION PANEL
ALT: 17 U/L (ref 0–44)
AST: 19 U/L (ref 15–41)
Albumin: 4.4 g/dL (ref 3.5–5.0)
Alkaline Phosphatase: 62 U/L (ref 38–126)
Bilirubin, Direct: <0.1 mg/dL (ref 0.0–0.2)
Total Bilirubin: 0.7 mg/dL (ref 0.0–1.2)
Total Protein: 7.4 g/dL (ref 6.5–8.1)

## 2023-09-04 LAB — CBC
HCT: 39.6 % (ref 36.0–46.0)
Hemoglobin: 12.8 g/dL (ref 12.0–15.0)
MCH: 29.8 pg (ref 26.0–34.0)
MCHC: 32.3 g/dL (ref 30.0–36.0)
MCV: 92.3 fL (ref 80.0–100.0)
Platelets: 262 10*3/uL (ref 150–400)
RBC: 4.29 MIL/uL (ref 3.87–5.11)
RDW: 13 % (ref 11.5–15.5)
WBC: 8.1 10*3/uL (ref 4.0–10.5)
nRBC: 0 % (ref 0.0–0.2)

## 2023-09-04 LAB — TROPONIN I (HIGH SENSITIVITY)
Troponin I (High Sensitivity): 4 ng/L (ref <18)
Troponin I (High Sensitivity): 4 ng/L (ref <18)

## 2023-09-04 MED ORDER — ALUM & MAG HYDROXIDE-SIMETH 200-200-20 MG/5ML PO SUSP
30.0000 mL | Freq: Once | ORAL | Status: AC
  Administered 2023-09-04: 30 mL via ORAL
  Filled 2023-09-04: qty 30

## 2023-09-04 MED ORDER — FAMOTIDINE 20 MG PO TABS
20.0000 mg | ORAL_TABLET | Freq: Once | ORAL | Status: AC
  Administered 2023-09-04: 20 mg via ORAL
  Filled 2023-09-04: qty 1

## 2023-09-04 MED ORDER — LIDOCAINE VISCOUS HCL 2 % MT SOLN
15.0000 mL | Freq: Once | OROMUCOSAL | Status: AC
  Administered 2023-09-04: 15 mL via OROMUCOSAL
  Filled 2023-09-04: qty 15

## 2023-09-04 NOTE — Discharge Instructions (Signed)
 Consider starting famotidine/Pepcid.  This is a once per day every day medicine to help reduce acid production in the stomach and manage reflux.

## 2023-09-04 NOTE — ED Provider Notes (Signed)
 [facility] Provider Note    Event Date/Time   First MD Initiated Contact with Patient 09/04/23 1624     (approximate)   History   Chest Pain   HPI  Kelly Mata is a 73 y.o. female who presents to the ED for evaluation of Chest Pain   Reviewed PCP visit from 6/12 a ED visit from 6/13.  Waxing and waning RUQ/epigastric pain, outpatient CT unremarkable.  Patient presents to the ED due to intermittent epigastric burning that radiates to her back on occasion her left neck throughout the day today.  She reports intermittent pain all day, lasting minutes at a time before self resolving.  No dizziness or syncope, emesis or nausea, fevers or other concerns.  Reports that she has a follow-up appoint with her cardiologist this coming Thursday.  Reports palpitations at night and significant illness anxiety with various test in the past few weeks.  She pulls up multiple tests on her phone/MyChart and we go through them together and discussed significance   Physical Exam   Triage Vital Signs: ED Triage Vitals  Encounter Vitals Group     BP 09/04/23 1150 (!) 168/85     Girls Systolic BP Percentile --      Girls Diastolic BP Percentile --      Boys Systolic BP Percentile --      Boys Diastolic BP Percentile --      Pulse Rate 09/04/23 1150 89     Resp 09/04/23 1150 18     Temp 09/04/23 1150 98 F (36.7 C)     Temp src --      SpO2 09/04/23 1150 100 %     Weight 09/04/23 1154 150 lb (68 kg)     Height 09/04/23 1154 5' 1 (1.549 m)     Head Circumference --      Peak Flow --      Pain Score 09/04/23 1154 7     Pain Loc --      Pain Education --      Exclude from Growth Chart --     Most recent vital signs: Vitals:   09/04/23 1150 09/04/23 1651  BP: (!) 168/85 (!) 156/88  Pulse: 89 81  Resp: 18 20  Temp: 98 F (36.7 C) 98.1 F (36.7 C)  SpO2: 100% 100%    General: Awake, no distress.  CV:  Good peripheral perfusion.  Resp:  Normal effort.   Abd:  No distention.  Minimal epigastric tenderness without guarding or peritoneal features.  Abdomen is otherwise benign. MSK:  No deformity noted.  Neuro:  No focal deficits appreciated. Other:     ED Results / Procedures / Treatments   Labs (all labs ordered are listed, but only abnormal results are displayed) Labs Reviewed  BASIC METABOLIC PANEL WITH GFR - Abnormal; Notable for the following components:      Result Value   Glucose, Bld 100 (*)    All other components within normal limits  CBC  HEPATIC FUNCTION PANEL  TROPONIN I (HIGH SENSITIVITY)  TROPONIN I (HIGH SENSITIVITY)    EKG Treatment is baseline.  Seems to demonstrate sinus rhythm with a rate of 99 bpm without clear STEMI.  Normal axis  RADIOLOGY CXR interpreted by me without evidence of acute cardiopulmonary pathology.  Official radiology report(s): DG Chest 2 View Result Date: 09/04/2023 CLINICAL DATA:  Chest and back pain. Shortness of breath. Hypertension. EXAM: CHEST - 2 VIEW COMPARISON:  07/13/2023. FINDINGS: Trachea  is midline. Heart size normal. Streaky scarring in the lingula and both lower lobes. Lungs are otherwise clear. No pleural fluid. IMPRESSION: No acute findings. Electronically Signed   By: Newell Eke M.D.   On: 09/04/2023 13:45    PROCEDURES and INTERVENTIONS:  .1-3 Lead EKG Interpretation  Performed by: Claudene Rover, MD Authorized by: Claudene Rover, MD     Interpretation: normal     ECG rate:  78   ECG rate assessment: normal     Rhythm: sinus rhythm     Ectopy: none     Conduction: normal     Medications  alum & mag hydroxide-simeth (MAALOX/MYLANTA) 200-200-20 MG/5ML suspension 30 mL (30 mLs Oral Given 09/04/23 1733)  lidocaine  (XYLOCAINE ) 2 % viscous mouth solution 15 mL (15 mLs Mouth/Throat Given 09/04/23 1733)  famotidine (PEPCID) tablet 20 mg (20 mg Oral Given 09/04/23 1733)     IMPRESSION / MDM / ASSESSMENT AND PLAN / ED COURSE  I reviewed the triage vital signs and the  nursing notes.  Differential diagnosis includes, but is not limited to, ACS, PTX, PNA, muscle strain/spasm, PE, dissection, anxiety, pleural effusion, gastritis or GERD  {Patient presents with symptoms of an acute illness or injury that is potentially life-threatening.  Patient presents with atypical intermittent epigastric and chest discomfort that is possibly of gastric etiology, with a benign workup and suitable for outpatient management.  Mild localized tenderness, otherwise anxious but normal exam.  Normal CBC, LFTs, troponin x 2 and CXR.  GI cocktail and initiate H2 blocker with improvement of symptoms.  She is follow-up with her cardiologist on Thursday and we discussed appropriate ED return precautions.    Clinical Course as of 09/04/23 1808  Mon Sep 04, 2023  1808 Reassessed. Feeling better. Discussed workup, plan of care, return precautions [DS]    Clinical Course User Index [DS] Claudene Rover, MD     FINAL CLINICAL IMPRESSION(S) / ED DIAGNOSES   Final diagnoses:  Epigastric burning sensation     Rx / DC Orders   ED Discharge Orders     None        Note:  This document was prepared using Dragon voice recognition software and may include unintentional dictation errors.   Claudene Rover, MD 09/04/23 [phone]

## 2023-09-04 NOTE — ED Triage Notes (Signed)
 Pt comes with /o epigastric pain and cp. Pt state mid sternal. Pt states radiation to left side of back. Pt states some indigestion. Pt denies any N/V. Pt denies any sob.

## 2023-09-07 ENCOUNTER — Encounter: Payer: Self-pay | Admitting: Family

## 2023-09-07 DIAGNOSIS — R0789 Other chest pain: Secondary | ICD-10-CM | POA: Diagnosis not present

## 2023-09-07 DIAGNOSIS — R002 Palpitations: Secondary | ICD-10-CM | POA: Diagnosis not present

## 2023-09-07 DIAGNOSIS — E6609 Other obesity due to excess calories: Secondary | ICD-10-CM | POA: Diagnosis not present

## 2023-09-07 DIAGNOSIS — Z853 Personal history of malignant neoplasm of breast: Secondary | ICD-10-CM | POA: Diagnosis not present

## 2023-09-07 DIAGNOSIS — I10 Essential (primary) hypertension: Secondary | ICD-10-CM | POA: Diagnosis not present

## 2023-09-07 DIAGNOSIS — R0602 Shortness of breath: Secondary | ICD-10-CM | POA: Diagnosis not present

## 2023-10-06 DIAGNOSIS — Z0189 Encounter for other specified special examinations: Secondary | ICD-10-CM | POA: Diagnosis not present

## 2023-10-06 DIAGNOSIS — M1712 Unilateral primary osteoarthritis, left knee: Secondary | ICD-10-CM | POA: Diagnosis not present

## 2023-10-12 ENCOUNTER — Ambulatory Visit: Payer: PPO | Admitting: Family

## 2023-10-12 ENCOUNTER — Encounter: Payer: Self-pay | Admitting: Family

## 2023-10-12 VITALS — BP 130/78 | HR 78 | Temp 97.1°F | Ht 61.0 in | Wt 150.6 lb

## 2023-10-12 DIAGNOSIS — Z0000 Encounter for general adult medical examination without abnormal findings: Secondary | ICD-10-CM

## 2023-10-12 DIAGNOSIS — M810 Age-related osteoporosis without current pathological fracture: Secondary | ICD-10-CM | POA: Diagnosis not present

## 2023-10-12 DIAGNOSIS — E785 Hyperlipidemia, unspecified: Secondary | ICD-10-CM | POA: Diagnosis not present

## 2023-10-12 DIAGNOSIS — R1011 Right upper quadrant pain: Secondary | ICD-10-CM

## 2023-10-12 LAB — HEMOGLOBIN A1C: Hgb A1c MFr Bld: 5.7 % (ref 4.6–6.5)

## 2023-10-12 LAB — LIPID PANEL
Cholesterol: 183 mg/dL (ref 0–200)
HDL: 65.3 mg/dL (ref >39.00)
LDL Cholesterol: 103 mg/dL — ABNORMAL HIGH (ref 0–99)
NonHDL: 117.54
Total CHOL/HDL Ratio: 3
Triglycerides: 72 mg/dL (ref 0.0–149.0)
VLDL: 14.4 mg/dL (ref 0.0–40.0)

## 2023-10-12 LAB — VITAMIN D 25 HYDROXY (VIT D DEFICIENCY, FRACTURES): VITD: 38.97 ng/mL (ref 30.00–100.00)

## 2023-10-12 NOTE — Patient Instructions (Signed)
 Nice to see you today Please reach out to Dr Leora and ensure cardiac clearance from Dr Florencio is not needed  Labs today  Health Maintenance for Postmenopausal Women Menopause is a normal process in which your ability to get pregnant comes to an end. This process happens slowly over many months or years, usually between the ages of 96 and 79. Menopause is complete when you have missed your menstrual period for 12 months. It is important to talk with your health care provider about some of the most common conditions that affect women after menopause (postmenopausal women). These include heart disease, cancer, and bone loss (osteoporosis). Adopting a healthy lifestyle and getting preventive care can help to promote your health and wellness. The actions you take can also lower your chances of developing some of these common conditions. What are the signs and symptoms of menopause? During menopause, you may have the following symptoms: Hot flashes. These can be moderate or severe. Night sweats. Decrease in sex drive. Mood swings. Headaches. Tiredness (fatigue). Irritability. Memory problems. Problems falling asleep or staying asleep. Talk with your health care provider about treatment options for your symptoms. Do I need hormone replacement therapy? Hormone replacement therapy is effective in treating symptoms that are caused by menopause, such as hot flashes and night sweats. Hormone replacement carries certain risks, especially as you become older. If you are thinking about using estrogen or estrogen with progestin, discuss the benefits and risks with your health care provider. How can I reduce my risk for heart disease and stroke? The risk of heart disease, heart attack, and stroke increases as you age. One of the causes may be a change in the body's hormones during menopause. This can affect how your body uses dietary fats, triglycerides, and cholesterol. Heart attack and stroke are  medical emergencies. There are many things that you can do to help prevent heart disease and stroke. Watch your blood pressure High blood pressure causes heart disease and increases the risk of stroke. This is more likely to develop in people who have high blood pressure readings or are overweight. Have your blood pressure checked: Every 3-5 years if you are 44-56 years of age. Every year if you are 18 years old or older. Eat a healthy diet  Eat a diet that includes plenty of vegetables, fruits, low-fat dairy products, and lean protein. Do not eat a lot of foods that are high in solid fats, added sugars, or sodium. Get regular exercise Get regular exercise. This is one of the most important things you can do for your health. Most adults should: Try to exercise for at least 150 minutes each week. The exercise should increase your heart rate and make you sweat (moderate-intensity exercise). Try to do strengthening exercises at least twice each week. Do these in addition to the moderate-intensity exercise. Spend less time sitting. Even light physical activity can be beneficial. Other tips Work with your health care provider to achieve or maintain a healthy weight. Do not use any products that contain nicotine or tobacco. These products include cigarettes, chewing tobacco, and vaping devices, such as e-cigarettes. If you need help quitting, ask your health care provider. Know your numbers. Ask your health care provider to check your cholesterol and your blood sugar (glucose). Continue to have your blood tested as directed by your health care provider. Do I need screening for cancer? Depending on your health history and family history, you may need to have cancer screenings at different stages of your  life. This may include screening for: Breast cancer. Cervical cancer. Lung cancer. Colorectal cancer. What is my risk for osteoporosis? After menopause, you may be at increased risk for  osteoporosis. Osteoporosis is a condition in which bone destruction happens more quickly than new bone creation. To help prevent osteoporosis or the bone fractures that can happen because of osteoporosis, you may take the following actions: If you are 37-47 years old, get at least 1,000 mg of calcium and at least 600 international units (IU) of vitamin D  per day. If you are older than age 55 but younger than age 35, get at least 1,200 mg of calcium and at least 600 international units (IU) of vitamin D  per day. If you are older than age 40, get at least 1,200 mg of calcium and at least 800 international units (IU) of vitamin D  per day. Smoking and drinking excessive alcohol increase the risk of osteoporosis. Eat foods that are rich in calcium and vitamin D , and do weight-bearing exercises several times each week as directed by your health care provider. How does menopause affect my mental health? Depression may occur at any age, but it is more common as you become older. Common symptoms of depression include: Feeling depressed. Changes in sleep patterns. Changes in appetite or eating patterns. Feeling an overall lack of motivation or enjoyment of activities that you previously enjoyed. Frequent crying spells. Talk with your health care provider if you think that you are experiencing any of these symptoms. General instructions See your health care provider for regular wellness exams and vaccines. This may include: Scheduling regular health, dental, and eye exams. Getting and maintaining your vaccines. These include: Influenza vaccine. Get this vaccine each year before the flu season begins. Pneumonia vaccine. Shingles vaccine. Tetanus, diphtheria, and pertussis (Tdap) booster vaccine. Your health care provider may also recommend other immunizations. Tell your health care provider if you have ever been abused or do not feel safe at home. Summary Menopause is a normal process in which your  ability to get pregnant comes to an end. This condition causes hot flashes, night sweats, decreased interest in sex, mood swings, headaches, or lack of sleep. Treatment for this condition may include hormone replacement therapy. Take actions to keep yourself healthy, including exercising regularly, eating a healthy diet, watching your weight, and checking your blood pressure and blood sugar levels. Get screened for cancer and depression. Make sure that you are up to date with all your vaccines. This information is not intended to replace advice given to you by your health care provider. Make sure you discuss any questions you have with your health care provider. Document Revised: 07/20/2020 Document Reviewed: 07/20/2020 Elsevier Patient Education  2024 [employer].

## 2023-10-12 NOTE — Progress Notes (Signed)
 Assessment & Plan:  Osteoporosis, unspecified osteoporosis type, unspecified pathological fracture presence -     VITAMIN D  25 Hydroxy (Vit-D Deficiency, Fractures); Future  Hyperlipidemia, unspecified hyperlipidemia type -     Hemoglobin A1c; Future -     Lipid panel; Future  RUQ pain Assessment & Plan: Fortunately resolved. Will continue to monitor.    Annual physical exam Assessment & Plan: CBE performed. Deferred pelvic exam in the absence of complaints and she is no longer screening for cervical cancer. She also reports that she doesn't have a cervix nor h/o abnormal pap smear.  Mammogram UTD.        Return precautions given.   Risks, benefits, and alternatives of the medications and treatment plan prescribed today were discussed, and patient expressed understanding.   Education regarding symptom management and diagnosis given to patient on AVS either electronically or printed.  No follow-ups on file.  Rollene Northern, FNP  Subjective:    Patient ID: Neville JONELLE Notch, female    DOB: 17-Feb-1951, 73 y.o.   MRN: [id number]  CC: MARRION FINAN is a 73 y.o. female who presents today for physical exam.    HPI: Feels well today No new complaints No further episodes of abdominal pain.  Planning on TKR left knee.  Seen by Dr Leora 10/06/23 for follow up , surgery planned 11/22/23  H/o SCC. Follows annually with dermatology  H/o left breast cancer 2009  Colorectal Cancer Screening: UTD reported per patient 06/30/2016 Dr hilbert; return in 10 years Breast Cancer Screening: Mammogram UTD Cervical Cancer Screening: No longer screening H/o hysterectomy had for noncancerous reasons, had fibroids. No ovaries. States no cervix.  Bone Health screening/DEXA for 65+: UTD 07/06/23, osteoporosis  Lung Cancer Screening: Doesn't have 20 year pack year history and age > 11 years yo 89 years        Tetanus - UTD        Pneumococcal -complete Exercise:  Limited due to knee  pain.Walks dog daily. Denies CP, SOB  Alcohol use:  occassional Smoking/tobacco use: Nonsmoker.    Health Maintenance  Topic Date Due   COVID-19 Vaccine (7 - Mixed Product risk 2024-25 season) 06/05/2023   Medicare Annual Wellness Visit  11/10/2023   Flu Shot  10/13/2023   Colon Cancer Screening  03/14/2024   Mammogram  07/05/2024   DEXA scan (bone density measurement)  07/05/2025   DTaP/Tdap/Td vaccine (3 - Td or Tdap) 12/04/2030   Pneumococcal Vaccine for age over 53  Completed   Hepatitis C Screening  Completed   Zoster (Shingles) Vaccine  Completed   Hepatitis B Vaccine  Aged Out   HPV Vaccine  Aged Out   Meningitis B Vaccine  Aged Out    ALLERGIES: Amlodipine , Ceclor [cefaclor], Ampicillin, and Bactrim  [sulfamethoxazole -trimethoprim ]  Current Outpatient Medications on File Prior to Visit  Medication Sig Dispense Refill   Ascorbic Acid (VITAMIN C PO) Take by mouth daily.     BIOTIN PO Take by mouth daily.     HAWTHORN PO Take 2 capsules by mouth daily.     hydrochlorothiazide  (MICROZIDE ) 12.5 MG capsule Take 1 capsule (12.5 mg total) by mouth daily as needed. If Blood pressure > 130/80. 90 capsule 3   ibuprofen (ADVIL,MOTRIN) 200 MG tablet Take 200 mg by mouth every 6 (six) hours as needed.     Turmeric 400 MG CAPS Take 1 capsule by mouth daily.     valACYclovir  (VALTREX ) 1000 MG tablet Take two tablets ( total 2000 mg)  by mouth q12h x 1 day; Start: ASAP after symptom onset 30 tablet 2   VITAMIN D  PO Take 1 capsule by mouth daily. 2000 IU     Cyanocobalamin  (VITAMIN B12 PO) Take by mouth daily. (Patient not taking: Reported on 10/12/2023)     No current facility-administered medications on file prior to visit.    Review of Systems  Constitutional:  Negative for chills and fever.  Respiratory:  Negative for cough.   Cardiovascular:  Negative for chest pain and palpitations.  Gastrointestinal:  Negative for abdominal pain, nausea and vomiting.  Musculoskeletal:  Positive  for arthralgias (left knee).      Objective:    BP 130/78   Pulse 78   Temp (!) 97.1 F (36.2 C) (Oral)   Ht 5' 1 (1.549 m)   Wt 150 lb 9.6 oz (68.3 kg)   SpO2 98%   BMI 28.46 kg/m   BP Readings from Last 3 Encounters:  10/12/23 130/78  09/04/23 (!) 149/84  08/28/23 (!) 152/76   Wt Readings from Last 3 Encounters:  10/12/23 150 lb 9.6 oz (68.3 kg)  09/04/23 150 lb (68 kg)  08/25/23 150 lb (68 kg)    Physical Exam Vitals reviewed.  Constitutional:      Appearance: Normal appearance. She is well-developed.  Eyes:     Conjunctiva/sclera: Conjunctivae normal.  Neck:     Thyroid : No thyroid  mass or thyromegaly.  Cardiovascular:     Rate and Rhythm: Normal rate and regular rhythm.     Pulses: Normal pulses.     Heart sounds: Normal heart sounds.  Pulmonary:     Effort: Pulmonary effort is normal.     Breath sounds: Normal breath sounds. No wheezing, rhonchi or rales.  Chest:  Breasts:    Breasts are symmetrical.     Right: No inverted nipple, mass, nipple discharge, skin change or tenderness.     Left: No inverted nipple, mass, nipple discharge, skin change or tenderness.  Abdominal:     General: Bowel sounds are normal. There is no distension.     Palpations: Abdomen is soft. Abdomen is not rigid. There is no fluid wave or mass.     Tenderness: There is no abdominal tenderness. There is no guarding or rebound.  Lymphadenopathy:     Head:     Right side of head: No submental, submandibular, tonsillar, preauricular, posterior auricular or occipital adenopathy.     Left side of head: No submental, submandibular, tonsillar, preauricular, posterior auricular or occipital adenopathy.     Cervical: No cervical adenopathy.     Right cervical: No superficial, deep or posterior cervical adenopathy.    Left cervical: No superficial, deep or posterior cervical adenopathy.  Skin:    General: Skin is warm and dry.  Neurological:     Mental Status: She is alert.   Psychiatric:        Speech: Speech normal.        Behavior: Behavior normal.        Thought Content: Thought content normal.

## 2023-10-12 NOTE — Assessment & Plan Note (Signed)
 Fortunately resolved. Will continue to monitor.

## 2023-10-12 NOTE — Assessment & Plan Note (Signed)
 CBE performed. Deferred pelvic exam in the absence of complaints and she is no longer screening for cervical cancer. She also reports that she doesn't have a cervix nor h/o abnormal pap smear.  Mammogram UTD.

## 2023-10-16 ENCOUNTER — Ambulatory Visit: Payer: Self-pay | Admitting: Family

## 2023-11-03 DIAGNOSIS — S86912A Strain of unspecified muscle(s) and tendon(s) at lower leg level, left leg, initial encounter: Secondary | ICD-10-CM | POA: Diagnosis not present

## 2023-11-03 DIAGNOSIS — M1712 Unilateral primary osteoarthritis, left knee: Secondary | ICD-10-CM | POA: Diagnosis not present

## 2023-11-03 DIAGNOSIS — Z01818 Encounter for other preprocedural examination: Secondary | ICD-10-CM | POA: Diagnosis not present

## 2023-11-16 ENCOUNTER — Ambulatory Visit: Admission: EM | Admit: 2023-11-16 | Discharge: 2023-11-16 | Disposition: A | Discharge status: Home / Self Care

## 2023-11-16 ENCOUNTER — Encounter: Payer: Self-pay | Admitting: Emergency Medicine

## 2023-11-16 DIAGNOSIS — R350 Frequency of micturition: Secondary | ICD-10-CM | POA: Diagnosis not present

## 2023-11-16 DIAGNOSIS — M1712 Unilateral primary osteoarthritis, left knee: Secondary | ICD-10-CM | POA: Diagnosis not present

## 2023-11-16 LAB — POCT URINE DIPSTICK
Bilirubin, UA: NEGATIVE
Glucose, UA: NEGATIVE mg/dL
Ketones, POC UA: NEGATIVE mg/dL
Nitrite, UA: NEGATIVE
POC PROTEIN,UA: NEGATIVE
Spec Grav, UA: 1.015 (ref 1.010–1.025)
Urobilinogen, UA: 0.2 U/dL
pH, UA: 7 (ref 5.0–8.0)

## 2023-11-16 MED ORDER — CIPROFLOXACIN HCL 500 MG PO TABS: 500.0000 mg | ORAL_TABLET | Freq: Two times a day (BID) | ORAL | 0 refills | Status: AC

## 2023-11-16 NOTE — ED Triage Notes (Signed)
 Patient reports urinary frequency and painful urination x 2 days. Patient reports she has been taking vitamin C and drinking cranberry juice for symptoms.

## 2023-11-16 NOTE — ED Provider Notes (Signed)
 [facility]    CSN: [id number] Arrival date & time: 11/16/23  1059      History   Chief Complaint Chief Complaint  Patient presents with   Urinary Frequency   Dysuria    HPI Kelly Mata is a 73 y.o. female.   Patient presents for evaluation of dysuria and urinary frequency with a foul odor present for 2 days.  Has attempted use of vitamin C and cranberry juice without relief.  Denies hematuria, abdominal or flank pain, fever or vaginal symptoms  Past Medical History:  Diagnosis Date   Allergy    Breast cancer (HCC) 2009   neg   Cancer (HCC) 2009   left breast DCIS, 5 yrs tamoxifen   Headache    Heart murmur 2008   Personal history of radiation therapy    Squamous cell carcinoma     Patient Active Problem List   Diagnosis Date Noted   Bacteriuria 08/11/2023   RUQ pain 08/09/2023   Osteoporosis 07/13/2023   Epigastric pain 07/13/2023   Chest pain 07/13/2023   Fatigue 10/10/2022   HLD (hyperlipidemia) 01/05/2021   Thyroid  nodule 10/20/2020   Palpitations 10/05/2020   Grief reaction 09/08/2020   Arthralgia 10/07/2019   Arthritis 07/23/2019   Genital herpes 07/23/2019   Migraine 07/23/2019   Leg pain, bilateral 07/17/2019   HTN (hypertension) 04/26/2019   Liver lesion 04/26/2019   Dysuria 04/04/2018   Diarrhea 02/23/2018   Atherosclerosis of aorta (HCC) 08/21/2017   Nonintractable headache 08/16/2017   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 No ovaries. States no cervix.    BREAST BIOPSY Left 2009   stereo breast biopsy, +   BREAST CYST ASPIRATION  90s   BREAST EXCISIONAL BIOPSY Left 2008   neg   BREAST  EXCISIONAL BIOPSY Left 1998   BREAST LUMPECTOMY Left 2009   radiation   COLONOSCOPY  2013   OOPHORECTOMY     TONSILLECTOMY  1957    OB History     Gravida  3   Para      Term      Preterm      AB  1   Living  2      SAB      IAB  1   Ectopic      Multiple      Live Births           Obstetric Comments  Age with first menstruation-14 Age with first pregnancy-27 LMP-1998, hysterectomy          Home Medications    Prior to Admission medications   Medication Sig Start Date End Date Taking? Authorizing Provider  ciprofloxacin  (CIPRO ) 500 MG tablet Take 1 tablet (500 mg total) by mouth 2 (two) times daily for 5 days. 11/16/23 11/21/23 Yes Carlous Olivares, Shelba JONELLE, NP  Multiple Vitamins-Minerals (CITRACAL +D3) TABS Take 1 tablet by mouth daily.   Yes [provider]  Ascorbic Acid (VITAMIN C PO) Take by mouth daily.    [provider]  BIOTIN PO Take by mouth daily.    [provider]  Cyanocobalamin  (VITAMIN B12 PO) Take by mouth daily. Patient  not taking: Reported on 10/12/2023    [provider]  HAWTHORN PO Take 2 capsules by mouth daily.    [provider]  hydrochlorothiazide  (MICROZIDE ) 12.5 MG capsule Take 1 capsule (12.5 mg total) by mouth daily as needed. If Blood pressure > 130/80. 05/31/23   Dineen Rollene MATSU, FNP  ibuprofen (ADVIL,MOTRIN) 200 MG tablet Take 200 mg by mouth every 6 (six) hours as needed.    [provider]  Turmeric 400 MG CAPS Take 1 capsule by mouth daily.    [provider]  valACYclovir  (VALTREX ) 1000 MG tablet Take two tablets ( total 2000 mg) by mouth q12h x 1 day; Start: ASAP after symptom onset 07/27/22   Dineen Rollene MATSU, FNP  VITAMIN D  PO Take 1 capsule by mouth daily. 2000 IU    [provider]    Family History Family History  Problem Relation Age of Onset   Cancer Other        cousin, breast   Cancer Father        pancreatic (died at age 62)   Breast  cancer Cousin 29   Cancer Mother        skin - unsure of type but thinks it was melanoma (died at age 54)   Cancer Maternal Aunt    Diabetes Daughter     Social History Social History   Tobacco Use   Smoking status: Never   Smokeless tobacco: Never  Vaping Use   Vaping status: Never Used  Substance Use Topics   Alcohol use: Yes    Alcohol/week: 5.0 standard drinks of alcohol    Types: 5 Glasses of wine per week    Comment: 1 glass 3-5 days per week   Drug use: No     Allergies   Amlodipine , Ceclor [cefaclor], Ampicillin, and Bactrim  [sulfamethoxazole -trimethoprim ]   Review of Systems Review of Systems   Physical Exam Triage Vital Signs ED Triage Vitals [11/16/23 1145]  Encounter Vitals Group     BP      Girls Systolic BP Percentile      Girls Diastolic BP Percentile      Boys Systolic BP Percentile      Boys Diastolic BP Percentile      Pulse      Resp      Temp      Temp src      SpO2      Weight      Height      Head Circumference      Peak Flow      Pain Score 0     Pain Loc      Pain Education      Exclude from Growth Chart    No data found.  Updated Vital Signs There were no vitals taken for this visit.  Visual Acuity Right Eye Distance:   Left Eye Distance:   Bilateral Distance:    Right Eye Near:   Left Eye Near:    Bilateral Near:     Physical Exam Constitutional:      Appearance: Normal appearance.  Eyes:     Extraocular Movements: Extraocular movements intact.  Pulmonary:     Effort: Pulmonary effort is normal.  Abdominal:     Tenderness: There is no abdominal tenderness. There is no right CVA tenderness, left CVA tenderness or guarding.  Neurological:     Mental Status: She is alert and oriented to person, place, and time. Mental status is at baseline.  UC Treatments / Results  Labs (all labs ordered are listed, but only abnormal results are displayed) Labs Reviewed  URINE CULTURE  POCT URINE DIPSTICK     EKG   Radiology No results found.  Procedures Procedures (including critical care time)  Medications Ordered in UC Medications - No data to display  Initial Impression / Assessment and Plan / UC Course  I have reviewed the triage vital signs and the nursing notes.  Pertinent labs & imaging results that were available during my care of the patient were reviewed by me and considered in my medical decision making (see chart for details).  Urinary frequency  Urinalysis showing leukocytes and blood, negative for nitrates, sent for culture and empirically placed on ciprofloxacin  as she is symptomatic, antibiotic chosen off last culture available showing Enterobacter, discussed this with patient, recommended over-the-counter medications and nonpharmacological supportive care with follow-up as needed Final Clinical Impressions(s) / UC Diagnoses   Final diagnoses:  Urinary frequency     Discharge Instructions      Your urinalysis shows Lalisa Kiehn blood cells and microscopic blood but does not show bacteria at this time, your urine will be sent to the lab to determine exactly which bacteria is present, if any changes need to be made to your medications you will be notified  Begin use of ciprofloxacin  twice daily for 5 days  You may use over-the-counter Azo to help minimize your symptoms until antibiotic removes bacteria, this medication will turn your urine orange  Increase your fluid intake through use of water  As always practice good hygiene, wiping front to back and avoidance of scented vaginal products to prevent further irritation  If symptoms continue to persist after use of medication or recur please follow-up with urgent care or your primary doctor as needed    ED Prescriptions     Medication Sig Dispense Auth. Provider   ciprofloxacin  (CIPRO ) 500 MG tablet Take 1 tablet (500 mg total) by mouth 2 (two) times daily for 5 days. 10 tablet Talin Feister R, NP       PDMP not reviewed this encounter.   Teresa Shelba SAUNDERS, [place] 11/16/23 [phone]

## 2023-11-16 NOTE — Discharge Instructions (Addendum)
 Your urinalysis shows Kelly Mata blood cells and microscopic blood but does not show bacteria at this time, your urine will be sent to the lab to determine exactly which bacteria is present, if any changes need to be made to your medications you will be notified  Begin use of ciprofloxacin  twice daily for 5 days  You may use over-the-counter Azo to help minimize your symptoms until antibiotic removes bacteria, this medication will turn your urine orange  Increase your fluid intake through use of water  As always practice good hygiene, wiping front to back and avoidance of scented vaginal products to prevent further irritation  If symptoms continue to persist after use of medication or recur please follow-up with urgent care or your primary doctor as needed

## 2023-11-17 ENCOUNTER — Ambulatory Visit

## 2023-11-18 LAB — URINE CULTURE: Culture: 60000 — AB

## 2023-11-20 ENCOUNTER — Ambulatory Visit: Payer: Self-pay

## 2023-11-29 ENCOUNTER — Encounter

## 2023-11-29 DIAGNOSIS — G8918 Other acute postprocedural pain: Secondary | ICD-10-CM | POA: Diagnosis not present

## 2023-11-29 DIAGNOSIS — R262 Difficulty in walking, not elsewhere classified: Secondary | ICD-10-CM | POA: Diagnosis not present

## 2023-11-29 DIAGNOSIS — M1712 Unilateral primary osteoarthritis, left knee: Secondary | ICD-10-CM | POA: Diagnosis not present

## 2023-11-29 DIAGNOSIS — M25562 Pain in left knee: Secondary | ICD-10-CM | POA: Diagnosis not present

## 2023-12-06 DIAGNOSIS — M25562 Pain in left knee: Secondary | ICD-10-CM | POA: Diagnosis not present

## 2023-12-06 DIAGNOSIS — R262 Difficulty in walking, not elsewhere classified: Secondary | ICD-10-CM | POA: Diagnosis not present

## 2023-12-12 DIAGNOSIS — M25562 Pain in left knee: Secondary | ICD-10-CM | POA: Diagnosis not present

## 2023-12-12 DIAGNOSIS — R262 Difficulty in walking, not elsewhere classified: Secondary | ICD-10-CM | POA: Diagnosis not present

## 2023-12-14 ENCOUNTER — Ambulatory Visit
Admission: RE | Admit: 2023-12-14 | Discharge: 2023-12-14 | Disposition: A | Discharge status: Home / Self Care | Source: Ambulatory Visit | Attending: Orthopedic Surgery | Admitting: Orthopedic Surgery

## 2023-12-14 ENCOUNTER — Other Ambulatory Visit: Payer: Self-pay | Admitting: Orthopedic Surgery

## 2023-12-14 DIAGNOSIS — R2242 Localized swelling, mass and lump, left lower limb: Secondary | ICD-10-CM

## 2023-12-14 DIAGNOSIS — R600 Localized edema: Secondary | ICD-10-CM | POA: Diagnosis not present

## 2023-12-14 DIAGNOSIS — R262 Difficulty in walking, not elsewhere classified: Secondary | ICD-10-CM | POA: Diagnosis not present

## 2023-12-14 DIAGNOSIS — M79605 Pain in left leg: Secondary | ICD-10-CM | POA: Diagnosis not present

## 2023-12-14 DIAGNOSIS — M25562 Pain in left knee: Secondary | ICD-10-CM | POA: Diagnosis not present

## 2023-12-14 DIAGNOSIS — M7989 Other specified soft tissue disorders: Secondary | ICD-10-CM | POA: Diagnosis not present

## 2023-12-15 DIAGNOSIS — Z85828 Personal history of other malignant neoplasm of skin: Secondary | ICD-10-CM | POA: Diagnosis not present

## 2023-12-15 DIAGNOSIS — D225 Melanocytic nevi of trunk: Secondary | ICD-10-CM | POA: Diagnosis not present

## 2023-12-15 DIAGNOSIS — D2271 Melanocytic nevi of right lower limb, including hip: Secondary | ICD-10-CM | POA: Diagnosis not present

## 2023-12-15 DIAGNOSIS — Z08 Encounter for follow-up examination after completed treatment for malignant neoplasm: Secondary | ICD-10-CM | POA: Diagnosis not present

## 2023-12-15 DIAGNOSIS — D2272 Melanocytic nevi of left lower limb, including hip: Secondary | ICD-10-CM | POA: Diagnosis not present

## 2023-12-15 DIAGNOSIS — D2262 Melanocytic nevi of left upper limb, including shoulder: Secondary | ICD-10-CM | POA: Diagnosis not present

## 2023-12-15 DIAGNOSIS — D2261 Melanocytic nevi of right upper limb, including shoulder: Secondary | ICD-10-CM | POA: Diagnosis not present

## 2023-12-20 DIAGNOSIS — R262 Difficulty in walking, not elsewhere classified: Secondary | ICD-10-CM | POA: Diagnosis not present

## 2023-12-20 DIAGNOSIS — M25562 Pain in left knee: Secondary | ICD-10-CM | POA: Diagnosis not present

## 2023-12-22 DIAGNOSIS — M25562 Pain in left knee: Secondary | ICD-10-CM | POA: Diagnosis not present

## 2023-12-22 DIAGNOSIS — R262 Difficulty in walking, not elsewhere classified: Secondary | ICD-10-CM | POA: Diagnosis not present

## 2023-12-27 DIAGNOSIS — M25562 Pain in left knee: Secondary | ICD-10-CM | POA: Diagnosis not present

## 2023-12-27 DIAGNOSIS — R262 Difficulty in walking, not elsewhere classified: Secondary | ICD-10-CM | POA: Diagnosis not present

## 2023-12-29 DIAGNOSIS — R262 Difficulty in walking, not elsewhere classified: Secondary | ICD-10-CM | POA: Diagnosis not present

## 2023-12-29 DIAGNOSIS — M25562 Pain in left knee: Secondary | ICD-10-CM | POA: Diagnosis not present

## 2024-01-05 DIAGNOSIS — R262 Difficulty in walking, not elsewhere classified: Secondary | ICD-10-CM | POA: Diagnosis not present

## 2024-01-05 DIAGNOSIS — M25562 Pain in left knee: Secondary | ICD-10-CM | POA: Diagnosis not present

## 2024-01-08 DIAGNOSIS — Z4889 Encounter for other specified surgical aftercare: Secondary | ICD-10-CM | POA: Diagnosis not present

## 2024-01-08 DIAGNOSIS — Z96659 Presence of unspecified artificial knee joint: Secondary | ICD-10-CM | POA: Diagnosis not present

## 2024-01-11 DIAGNOSIS — M25562 Pain in left knee: Secondary | ICD-10-CM | POA: Diagnosis not present

## 2024-01-11 DIAGNOSIS — R262 Difficulty in walking, not elsewhere classified: Secondary | ICD-10-CM | POA: Diagnosis not present

## 2024-01-18 DIAGNOSIS — R262 Difficulty in walking, not elsewhere classified: Secondary | ICD-10-CM | POA: Diagnosis not present

## 2024-01-18 DIAGNOSIS — M25562 Pain in left knee: Secondary | ICD-10-CM | POA: Diagnosis not present

## 2024-01-25 DIAGNOSIS — R262 Difficulty in walking, not elsewhere classified: Secondary | ICD-10-CM | POA: Diagnosis not present

## 2024-01-25 DIAGNOSIS — M25562 Pain in left knee: Secondary | ICD-10-CM | POA: Diagnosis not present

## 2024-01-31 DIAGNOSIS — R262 Difficulty in walking, not elsewhere classified: Secondary | ICD-10-CM | POA: Diagnosis not present

## 2024-01-31 DIAGNOSIS — M25562 Pain in left knee: Secondary | ICD-10-CM | POA: Diagnosis not present

## 2024-10-18 ENCOUNTER — Encounter: Admitting: Family
